# Patient Record
Sex: Female | Born: 1935 | Race: White | Hispanic: No | Smoking: Never smoker
Health system: Southern US, Community
[De-identification: ages and names within clinical notes are randomized; demographics above are authoritative.]

## PROBLEM LIST (undated history)

## (undated) DIAGNOSIS — N189 Chronic kidney disease, unspecified: Secondary | ICD-10-CM

## (undated) DIAGNOSIS — I10 Essential (primary) hypertension: Secondary | ICD-10-CM

## (undated) DIAGNOSIS — I4891 Unspecified atrial fibrillation: Secondary | ICD-10-CM

## (undated) DIAGNOSIS — K219 Gastro-esophageal reflux disease without esophagitis: Secondary | ICD-10-CM

## (undated) DIAGNOSIS — M1990 Unspecified osteoarthritis, unspecified site: Secondary | ICD-10-CM

## (undated) DIAGNOSIS — N3941 Urge incontinence: Secondary | ICD-10-CM

## (undated) DIAGNOSIS — M150 Primary generalized (osteo)arthritis: Secondary | ICD-10-CM

## (undated) DIAGNOSIS — C4491 Basal cell carcinoma of skin, unspecified: Secondary | ICD-10-CM

## (undated) DIAGNOSIS — K449 Diaphragmatic hernia without obstruction or gangrene: Secondary | ICD-10-CM

## (undated) DIAGNOSIS — Z950 Presence of cardiac pacemaker: Secondary | ICD-10-CM

## (undated) DIAGNOSIS — M8000XD Age-related osteoporosis with current pathological fracture, unspecified site, subsequent encounter for fracture with routine healing: Secondary | ICD-10-CM

## (undated) DIAGNOSIS — I509 Heart failure, unspecified: Secondary | ICD-10-CM

## (undated) DIAGNOSIS — E871 Hypo-osmolality and hyponatremia: Secondary | ICD-10-CM

## (undated) DIAGNOSIS — N390 Urinary tract infection, site not specified: Secondary | ICD-10-CM

## (undated) DIAGNOSIS — B019 Varicella without complication: Secondary | ICD-10-CM

## (undated) HISTORY — DX: Urge incontinence: N39.41

## (undated) HISTORY — PX: BUNIONECTOMY: SHX129

## (undated) HISTORY — DX: Gastro-esophageal reflux disease without esophagitis: K21.9

## (undated) HISTORY — DX: Age-related osteoporosis with current pathological fracture, unspecified site, subsequent encounter for fracture with routine healing: M80.00XD

## (undated) HISTORY — PX: SKIN SURGERY: SHX2413

## (undated) HISTORY — DX: Unspecified osteoarthritis, unspecified site: M19.90

## (undated) HISTORY — DX: Primary generalized (osteo)arthritis: M15.0

## (undated) HISTORY — PX: CARDIAC CATHETERIZATION: SHX172

## (undated) HISTORY — PX: OTHER SURGICAL HISTORY: SHX169

## (undated) HISTORY — DX: Urinary tract infection, site not specified: N39.0

## (undated) HISTORY — PX: TONSILLECTOMY: SUR1361

## (undated) HISTORY — PX: ABDOMINAL HYSTERECTOMY: SHX81

## (undated) HISTORY — DX: Basal cell carcinoma of skin, unspecified: C44.91

## (undated) HISTORY — DX: Varicella without complication: B01.9

## (undated) HISTORY — DX: Essential (primary) hypertension: I10

## (undated) HISTORY — DX: Chronic kidney disease, unspecified: N18.9

## (undated) HISTORY — PX: BASAL CELL CARCINOMA EXCISION: SHX1214

## (undated) HISTORY — PX: HEMORRHOID SURGERY: SHX153

## (undated) HISTORY — DX: Diaphragmatic hernia without obstruction or gangrene: K44.9

## (undated) HISTORY — DX: Hypo-osmolality and hyponatremia: E87.1

## (undated) HISTORY — PX: APPENDECTOMY: SHX54

## (undated) HISTORY — DX: Unspecified atrial fibrillation: I48.91

## (undated) HISTORY — DX: Presence of cardiac pacemaker: Z95.0

---

## 2003-11-25 HISTORY — PX: PACEMAKER PLACEMENT: SHX43

## 2008-12-25 HISTORY — PX: PACEMAKER INSERTION: SHX728

## 2013-11-24 HISTORY — PX: CORONARY ANGIOGRAM: SHX5786

## 2014-11-24 HISTORY — PX: SMALL INTESTINE SURGERY: SHX150

## 2015-10-26 ENCOUNTER — Encounter: Payer: Self-pay | Admitting: Internal Medicine

## 2015-12-05 ENCOUNTER — Encounter: Payer: Self-pay | Admitting: Family Medicine

## 2015-12-05 ENCOUNTER — Ambulatory Visit (INDEPENDENT_AMBULATORY_CARE_PROVIDER_SITE_OTHER): Payer: PPO | Admitting: Family Medicine

## 2015-12-05 ENCOUNTER — Telehealth: Payer: Self-pay | Admitting: Family Medicine

## 2015-12-05 VITALS — BP 136/88 | HR 82 | Temp 97.8°F | Ht 60.0 in | Wt 106.0 lb

## 2015-12-05 DIAGNOSIS — S90414A Abrasion, right lesser toe(s), initial encounter: Secondary | ICD-10-CM

## 2015-12-05 DIAGNOSIS — H04552 Acquired stenosis of left nasolacrimal duct: Secondary | ICD-10-CM

## 2015-12-05 DIAGNOSIS — I4891 Unspecified atrial fibrillation: Secondary | ICD-10-CM | POA: Diagnosis not present

## 2015-12-05 DIAGNOSIS — L97521 Non-pressure chronic ulcer of other part of left foot limited to breakdown of skin: Secondary | ICD-10-CM | POA: Diagnosis not present

## 2015-12-05 DIAGNOSIS — Q105 Congenital stenosis and stricture of lacrimal duct: Secondary | ICD-10-CM | POA: Insufficient documentation

## 2015-12-05 DIAGNOSIS — I10 Essential (primary) hypertension: Secondary | ICD-10-CM

## 2015-12-05 DIAGNOSIS — N3941 Urge incontinence: Secondary | ICD-10-CM

## 2015-12-05 DIAGNOSIS — G894 Chronic pain syndrome: Secondary | ICD-10-CM

## 2015-12-05 DIAGNOSIS — Z85828 Personal history of other malignant neoplasm of skin: Secondary | ICD-10-CM

## 2015-12-05 MED ORDER — POLYETHYLENE GLYCOL 3350 17 GM/SCOOP PO POWD: 17.0000 g | Freq: Two times a day (BID) | ORAL | Status: DC | PRN

## 2015-12-05 MED ORDER — OXYBUTYNIN CHLORIDE 5 MG PO TABS: 2.5000 mg | ORAL_TABLET | Freq: Two times a day (BID) | ORAL | Status: DC

## 2015-12-05 MED ORDER — HYDROCODONE-ACETAMINOPHEN 7.5-325 MG PO TABS: 1.0000 | ORAL_TABLET | Freq: Three times a day (TID) | ORAL | Status: DC | PRN

## 2015-12-05 MED ORDER — METOPROLOL SUCCINATE ER 50 MG PO TB24: 50.0000 mg | ORAL_TABLET | Freq: Every day | ORAL | Status: DC

## 2015-12-05 NOTE — Assessment & Plan Note (Signed)
Previously seen by ophthalmology for this. We will refer back to ophthalmology.

## 2015-12-05 NOTE — Patient Instructions (Addendum)
Nice to meet you. I refilled her medications. We have referred you to number of different specialists. If you do not hear anything regarding this in the next week please let us know. If you develop any extremity pain increased swelling, decreased sensation, weakness, chest pain, shortness of breath, palpitations, fevers, drainage, redness, or any new or change in symptoms please seek medical attention.

## 2015-12-05 NOTE — Assessment & Plan Note (Signed)
The patient's chronic pain is mostly from her back, though does have scattered other pains especially if she is more active than usual. Her prior PCP was filling her narcotic prescription. I advised her that I do not fill chronic narcotics. We will refer her to pain management and I will provide her with a short term narcotic prescription. I advised her that narcotics can make her drowsy.

## 2015-12-05 NOTE — Telephone Encounter (Signed)
Lisinopril was the medication that was suppose to be stopped due to the hospital stopping it.

## 2015-12-05 NOTE — Assessment & Plan Note (Signed)
Her toe appears to be well-healing with scab formation, though her toe and several surrounding toes do appear purplish in color which would be concerning for some level of decreased blood perfusion. She has good pulses in her feet. The area does not look infected. She is neurologically intact in her foot. She needs ABIs to evaluate this and evaluation by vascular to ensure that she does not have any vascular issues.

## 2015-12-05 NOTE — Assessment & Plan Note (Signed)
Asymptomatic. Appeared to be in sinus rhythm today on exam. Has a pacemaker in place. We will refer to cardiology.

## 2015-12-05 NOTE — Assessment & Plan Note (Signed)
At goal today. We will continue her current medications. We will request lab work from prior physicians.

## 2015-12-05 NOTE — Telephone Encounter (Signed)
Pt has question about her medication list. She states that Dr. Caryl Bis has two different BP medications listed and she wants to make sure with him if she is suppose to take both of them. Please advise. Pt cb [phone]

## 2015-12-05 NOTE — Assessment & Plan Note (Signed)
This is a long-term issue for the patient. She has previously been on oxybutynin with good effect. We will restart her on oxybutynin 2.5 mg daily.

## 2015-12-05 NOTE — Progress Notes (Signed)
Patient ID: Darlene Yoder, female   DOB: 1922-02-13, 82 y.o.   MRN: [id number]  Darlene Yoder, Darlene Yoder Phone: [phone]  Darlene Yoder is a 82 y.o. female who presents today for new patient visit.  A. fib: Patient notes long history of atrial fibrillation. Has a pacemaker in place. Has been followed by cardiology and EP for this. Last saw EP just prior to leaving her home in [place] and they noted that her pacemaker was operating well. No palpitations. No chest pain or shortness of breath. She takes Eliquis. No issues with bleeding. She is also on metoprolol. She needs referral to cardiology to be followed for this here.  Right second toe wound: Patient notes she was treated for cellulitis in [place] in November of last year. She got antibiotics for this. Notes that the wound was initially draining material although this stopped after receiving antibiotics. Notes the toe and the surrounding toes have been slightly purple since finishing the antibiotics. States she can feel well on the foot. She was told by her prior PCP that it was circulation issues, though she reports not having any testing done for this. Also note she has had an x-ray of her foot in [place].  History of basal cell carcinoma of the face: Patient notes having a history of several of these removed and frozen. previously had a spot on her right temple that was frozen prior to leaving [place], though this has come back.  Chronic pain: Patient notes history of chronic pain relating to back fusions. She now she does not have any pain right now. Notes her pain in her back occurs if she moves around a lot will worsen throughout the day. She has been on Vicodin prescribed by her prior PCP for this. No drowsiness with this. Some constipation for which she takes MiraLAX. No bowel or bladder incontinence, no saddle anesthesia, no fevers, no numbness or weakness.  Blocked left tear duct: Patient notes a history of this. She had a stent placed in it for  over 6 months. Since this is been removed she has continued to have issues with goopiness in her eyes and nose. No vision changes.  Hypertension: Patient notes she is just on metoprolol for this. No chest pain or shortness of breath. She does have minimal lower extremity edema for which she wears stockings. States she had lab work just prior to leaving [place].  Urge incontinence: Patient notes long history of this. She's been treated with oxybutynin and is responded well to this. She's taking the medicine she has no symptoms. She notes she has nocturia times 3 at night and an urge to go to the bathroom. No dysuria. No abdominal pain. No urinary frequency.  Active Ambulatory Problems    Diagnosis Date Noted  . Atrial fibrillation ([place]) 12/05/2015  . Abrasion of second toe, right 12/05/2015  . History of basal cell carcinoma of skin 12/05/2015  . Chronic pain syndrome 12/05/2015  . Blocked tear duct 12/05/2015  . Essential hypertension 12/05/2015  . Urge incontinence 12/05/2015   Resolved Ambulatory Problems    Diagnosis Date Noted  . No Resolved Ambulatory Problems   Past Medical History  Diagnosis Date  . Arthritis   . Carcinoma of the skin, basal cell   . Chickenpox   . GERD (gastroesophageal reflux disease)   . Hypertension   . CKD (chronic kidney disease)   . UTI (lower urinary tract infection)     Family History  Problem Relation Age of Onset  .  Heart disease Father   . Stroke Father   . Hypertension Father     Social History   Social History  . Marital Status: Widowed    Spouse Name: N/A  . Number of Children: N/A  . Years of Education: N/A   Occupational History  . Not on file.   Social History Main Topics  . Smoking status: Never Smoker   . Smokeless tobacco: Not on file  . Alcohol Use: No  . Drug Use: No  . Sexual Activity: Not on file   Other Topics Concern  . Not on file   Social History Narrative  . No narrative on file    ROS   General:   Negative for nexplained weight loss, fever Skin: Negative for new or changing mole, sore that won't heal HEENT: Positive for trouble hearing, Negative for trouble seeing, ringing in ears, mouth sores, hoarseness, change in voice, dysphagia. CV:  Positive for lower extremity edema, Negative for chest pain, dyspnea, palpitations Resp: Negative for cough, dyspnea, hemoptysis GI: Negative for nausea, vomiting, diarrhea, constipation, abdominal pain, melena, hematochezia. GU: Negative for dysuria, incontinence, urinary hesitance, hematuria, vaginal or penile discharge, polyuria, sexual difficulty, lumps in testicle or breasts MSK: Negative for muscle cramps or aches, joint pain or swelling Neuro: Negative for headaches, weakness, numbness, dizziness, passing out/fainting Psych: Negative for depression, anxiety, memory problems  Objective  Physical Exam Filed Vitals:   12/05/15 1327  BP: 136/88  Pulse: 82  Temp: 97.8 F (36.6 C)    BP Readings from Last 3 Encounters:  12/05/15 136/88   Wt Readings from Last 3 Encounters:  12/05/15 106 lb (48.081 kg)    Physical Exam  Constitutional: No distress.  HENT:  Head: Normocephalic and atraumatic.  Right Ear: External ear normal.  Left Ear: External ear normal.  Mouth/Throat: Oropharynx is clear and moist. No oropharyngeal exudate.  Right temple with skin colored 3-4 mm lesion that is nontender and nonerythematous  Eyes: Conjunctivae are normal. Pupils are equal, round, and reactive to light.  Left eye with small amount of clear watery discharge  Neck: Neck supple.  Cardiovascular: Normal rate, regular rhythm and normal heart sounds.  Exam reveals no gallop and no friction rub.   No murmur heard. Trace lower extremity edema  Pulmonary/Chest: Effort normal and breath sounds normal. No respiratory distress. She has no wheezes. She has no rales.  Abdominal: Soft. Bowel sounds are normal. She exhibits no distension. There is no tenderness.  There is no rebound.  Exam performed with patient sitting in chair as she refuses to get on the exam table  Musculoskeletal:  Right second toe with small scabbed over lesion on the dorsum of the toe, the toe appears mildly purplish in color as do the surrounding to toes and a small part of the forefoot, left foot with similar coloring to the second and third toes, 2+ DP pulses in bilateral feet, 2+ posterior tibialis pulses in bilateral feet, capillary refill is good and all toes, toes are cool to touch No midline spine tenderness or step-off, no muscular back tenderness  Lymphadenopathy:    She has no cervical adenopathy.  Neurological: She is alert.  5/5 strength in bilateral biceps, triceps, grip, quads, hamstrings, plantar and dorsiflexion, sensation to light touch intact in bilateral UE and LE, patient refused to get out of chair to assess gait  Skin: Skin is warm and dry. She is not diaphoretic.  Psychiatric: Mood and affect normal.     Assessment/Plan:  Atrial fibrillation (HCC) Asymptomatic. Appeared to be in sinus rhythm today on exam. Has a pacemaker in place. We will refer to cardiology.  Abrasion of second toe, right Her toe appears to be well-healing with scab formation, though her toe and several surrounding toes do appear purplish in color which would be concerning for some level of decreased blood perfusion. She has good pulses in her feet. The area does not look infected. She is neurologically intact in her foot. She needs ABIs to evaluate this and evaluation by vascular to ensure that she does not have any vascular issues.  History of basal cell carcinoma of skin Lesion on temple is suspicious for likely recurrence of basal cell carcinoma. We will refer to dermatology for further evaluation.  Chronic pain syndrome The patient's chronic pain is mostly from her back, though does have scattered other pains especially if she is more active than usual. Her prior PCP was  filling her narcotic prescription. I advised her that I do not fill chronic narcotics. We will refer her to pain management and I will provide her with a short term narcotic prescription. I advised her that narcotics can make her drowsy.  Blocked tear duct Previously seen by ophthalmology for this. We will refer back to ophthalmology.  Essential hypertension At goal today. We will continue her current medications. We will request lab work from prior physicians.  Urge incontinence This is a long-term issue for the patient. She has previously been on oxybutynin with good effect. We will restart her on oxybutynin 2.5 mg daily.    Orders Placed This Encounter  Procedures  . Ambulatory referral to Cardiology    Referral Priority:  Routine    Referral Type:  Consultation    Referral Reason:  Specialty Services Required    Requested Specialty:  Cardiology    Number of Visits Requested:  1  . Ambulatory referral to Pain Clinic    Referral Priority:  Routine    Referral Type:  Consultation    Referral Reason:  Specialty Services Required    Requested Specialty:  Pain Medicine    Number of Visits Requested:  1  . Ambulatory referral to Ophthalmology    Referral Priority:  Routine    Referral Type:  Consultation    Referral Reason:  Specialty Services Required    Requested Specialty:  Ophthalmology    Number of Visits Requested:  1  . Ambulatory referral to Vascular Surgery    Referral Priority:  Routine    Referral Type:  Surgical    Referral Reason:  Specialty Services Required    Requested Specialty:  Vascular Surgery    Number of Visits Requested:  1  . Ambulatory referral to Dermatology    Referral Priority:  Routine    Referral Type:  Consultation    Referral Reason:  Specialty Services Required    Requested Specialty:  Dermatology    Number of Visits Requested:  1    Meds ordered this encounter  Medications  . ELIQUIS 2.5 MG TABS tablet    Sig: Take 2.5 mg by mouth 2  (two) times daily.    Refill:  5  . pantoprazole (PROTONIX) 40 MG tablet    Sig: Take 40 mg by mouth 2 (two) times daily.    Refill:  7  . traZODone (DESYREL) 50 MG tablet    Sig: Take 50 mg by mouth at bedtime.    Refill:  1  . DISCONTD: HYDROcodone-acetaminophen (NORCO) 7.5-325 MG tablet  Sig: Take 1 tablet by mouth every 8 (eight) hours as needed for moderate pain.  Marland Kitchen DISCONTD: OXYBUTYNIN CHLORIDE PO    Sig: Take 5 mg by mouth 2 (two) times daily.  Marland Kitchen DISCONTD: lisinopril (PRINIVIL,ZESTRIL) 20 MG tablet    Sig: Take 20 mg by mouth daily.  Marland Kitchen DISCONTD: metoprolol succinate (TOPROL-XL) 50 MG 24 hr tablet    Sig: Take 50 mg by mouth daily. Take with or immediately following a meal.  . oxybutynin (DITROPAN) 5 MG tablet    Sig: Take 0.5 tablets (2.5 mg total) by mouth 2 (two) times daily.    Dispense:  30 tablet    Refill:  1  . metoprolol succinate (TOPROL-XL) 50 MG 24 hr tablet    Sig: Take 1 tablet (50 mg total) by mouth daily. Take with or immediately following a meal.    Dispense:  90 tablet    Refill:  1  . HYDROcodone-acetaminophen (NORCO) 7.5-325 MG tablet    Sig: Take 1 tablet by mouth every 8 (eight) hours as needed for moderate pain.    Dispense:  60 tablet    Refill:  0  . polyethylene glycol powder (GLYCOLAX/MIRALAX) powder    Sig: Take 17 g by mouth 2 (two) times daily as needed.    Dispense:  3350 g    Refill:  1    Dragon voice recognition software was used during the dictation process of this note. If any phrases or words seem inappropriate it is likely secondary to the translation process being inefficient.  Darlene Yoder

## 2015-12-05 NOTE — Assessment & Plan Note (Signed)
Lesion on temple is suspicious for likely recurrence of basal cell carcinoma. We will refer to dermatology for further evaluation.

## 2015-12-05 NOTE — Progress Notes (Signed)
Pre visit review using our clinic review tool, if applicable. No additional management support is needed unless otherwise documented below in the visit note. 

## 2015-12-06 NOTE — Telephone Encounter (Signed)
Lisinopril removed from Med list.

## 2015-12-07 ENCOUNTER — Ambulatory Visit (INDEPENDENT_AMBULATORY_CARE_PROVIDER_SITE_OTHER): Payer: PPO | Admitting: Cardiovascular Disease

## 2015-12-07 ENCOUNTER — Encounter: Payer: Self-pay | Admitting: Cardiovascular Disease

## 2015-12-07 VITALS — BP 124/68 | HR 80 | Ht 60.0 in | Wt 106.0 lb

## 2015-12-07 DIAGNOSIS — I10 Essential (primary) hypertension: Secondary | ICD-10-CM | POA: Diagnosis not present

## 2015-12-07 DIAGNOSIS — I4891 Unspecified atrial fibrillation: Secondary | ICD-10-CM | POA: Diagnosis not present

## 2015-12-07 DIAGNOSIS — Z950 Presence of cardiac pacemaker: Secondary | ICD-10-CM | POA: Insufficient documentation

## 2015-12-07 NOTE — Patient Instructions (Signed)
Medication Instructions:  Your physician recommends that you continue on your current medications as directed. Please refer to the Current Medication list given to you today.   Labwork: none  Testing/Procedures: none  Follow-Up: Refer to EP - Dr. Caryl Comes  Any Other Special Instructions Will Be Listed Below (If Applicable).     If you need a refill on your cardiac medications before your next appointment, please call your pharmacy.

## 2015-12-07 NOTE — Assessment & Plan Note (Signed)
I referred her to Dr. Caryl Comes to establish with our device clinic.  She can follow-up with general cardiology as needed.

## 2015-12-07 NOTE — Progress Notes (Signed)
Primary care physician: Dr. Caryl Bis   HPI  This is a pleasant 82 year old female who is here today to establish cardiovascular care. She moved last month from [place], [place] in order to live with her niece. The patient has known history of atrial fibrillation on anticoagulation and permanent pacemaker placement. She reports presenting in January 2010 with symptoms of heart failure and dizziness. A temporary pacemaker was placed followed by a [employer] permanent pacemaker placement during her hospitalization. She was initially anticoagulated with warfarin for atrial fibrillation but has a lot of issues regulating her INR. She was ultimately switched to low-dose Eliquis with no issues since then. She was hospitalized in [place] in October and November for falls and generalized weakness. She was found to have hyponatremia. These records are not available. She mentions that lisinopril was switched to Toprol during her most recent admission. She was told that she needed to live with somebody for support instead of living alone and thus she decided to move to [place] to live with her niece. She has been doing well overall with no significant chest pain, shortness of breath or palpitations. She reports chronic leg edema which improved with leg elevation.  Allergies  Allergen Reactions  . Tetanus Toxoids      Current Outpatient Prescriptions on File Prior to Visit  Medication Sig Dispense Refill  . ELIQUIS 2.5 MG TABS tablet Take 2.5 mg by mouth 2 (two) times daily.  5  . HYDROcodone-acetaminophen (NORCO) 7.5-325 MG tablet Take 1 tablet by mouth every 8 (eight) hours as needed for moderate pain. 60 tablet 0  . metoprolol succinate (TOPROL-XL) 50 MG 24 hr tablet Take 1 tablet (50 mg total) by mouth daily. Take with or immediately following a meal. 90 tablet 1  . oxybutynin (DITROPAN) 5 MG tablet Take 0.5 tablets (2.5 mg total) by mouth 2 (two) times daily. 30 tablet 1  . pantoprazole  (PROTONIX) 40 MG tablet Take 40 mg by mouth 2 (two) times daily.  7  . polyethylene glycol powder (GLYCOLAX/MIRALAX) powder Take 17 g by mouth 2 (two) times daily as needed. 3350 g 1  . traZODone (DESYREL) 50 MG tablet Take 50 mg by mouth at bedtime.  1   No current facility-administered medications on file prior to visit.     Past Medical History  Diagnosis Date  . Arthritis   . Carcinoma of the skin, basal cell   . Chickenpox   . GERD (gastroesophageal reflux disease)   . Atrial fibrillation ([place])   . Hypertension   . CKD (chronic kidney disease)   . Urge incontinence   . UTI (lower urinary tract infection)      Past Surgical History  Procedure Laterality Date  . Appendectomy    . Abdominal hysterectomy    . Pacemaker insertion  12/25/2008    [employer] pacemaker DRRF 2210 Serial [id number]  . Hemorrhoid surgery    . Tonsillectomy    . Skin surgery      removal of a mass on cheek  . Basal cell carcinoma excision Left     cheek  . Coronary angiogram  2015    [place]      Family History  Problem Relation Age of Onset  . Heart disease Father   . Stroke Father   . Hypertension Father      Social History   Social History  . Marital Status: Widowed    Spouse Name: N/A  . Number of Children: N/A  . Years  of Education: N/A   Occupational History  . Not on file.   Social History Main Topics  . Smoking status: Never Smoker   . Smokeless tobacco: Not on file  . Alcohol Use: No  . Drug Use: No  . Sexual Activity: Not on file   Other Topics Concern  . Not on file   Social History Narrative     ROS A 10 point review of system was performed. It is negative other than that mentioned in the history of present illness.   PHYSICAL EXAM   BP 124/68 mmHg  Pulse 80  Ht 5' (1.524 m)  Wt 106 lb (48.081 kg)  BMI 20.70 kg/m2 Constitutional: She is oriented to person, place, and time. She appears well-developed and well-nourished. No distress.  HENT: No nasal  discharge.  Head: Normocephalic and atraumatic.  Eyes: Pupils are equal and round. No discharge.  Neck: Normal range of motion. Neck supple. No JVD present. No thyromegaly present.  Cardiovascular: Normal rate, regular rhythm, normal heart sounds. Exam reveals no gallop and no friction rub. No murmur heard.  Pulmonary/Chest: Effort normal and breath sounds normal. No stridor. No respiratory distress. She has no wheezes. She has no rales. She exhibits no tenderness.  Abdominal: Soft. Bowel sounds are normal. She exhibits no distension. There is no tenderness. There is no rebound and no guarding.  Musculoskeletal: Normal range of motion. She exhibits +2 edema and no tenderness.  Neurological: She is alert and oriented to person, place, and time. Coordination normal.  Skin: Skin is warm and dry. No rash noted. She is not diaphoretic. No erythema. No pallor.  Psychiatric: She has a normal mood and affect. Her behavior is normal. Judgment and thought content normal.     EKG: Ventricular paced rhythm with underlying atrial fibrillation.   ASSESSMENT AND PLAN

## 2015-12-07 NOTE — Assessment & Plan Note (Signed)
He is doing well overall from a cardiac standpoint. She is tolerating anticoagulation with no side effects. She is on appropriate dose of Eliquis considering her age and weight.

## 2015-12-10 DIAGNOSIS — H10402 Unspecified chronic conjunctivitis, left eye: Secondary | ICD-10-CM | POA: Diagnosis not present

## 2015-12-13 DIAGNOSIS — I739 Peripheral vascular disease, unspecified: Secondary | ICD-10-CM | POA: Diagnosis not present

## 2015-12-13 DIAGNOSIS — E785 Hyperlipidemia, unspecified: Secondary | ICD-10-CM | POA: Diagnosis not present

## 2015-12-13 DIAGNOSIS — I10 Essential (primary) hypertension: Secondary | ICD-10-CM | POA: Diagnosis not present

## 2015-12-13 DIAGNOSIS — I701 Atherosclerosis of renal artery: Secondary | ICD-10-CM | POA: Diagnosis not present

## 2015-12-13 DIAGNOSIS — I6523 Occlusion and stenosis of bilateral carotid arteries: Secondary | ICD-10-CM | POA: Diagnosis not present

## 2015-12-13 DIAGNOSIS — M79609 Pain in unspecified limb: Secondary | ICD-10-CM | POA: Diagnosis not present

## 2015-12-13 DIAGNOSIS — I70213 Atherosclerosis of native arteries of extremities with intermittent claudication, bilateral legs: Secondary | ICD-10-CM | POA: Diagnosis not present

## 2015-12-17 ENCOUNTER — Telehealth: Payer: Self-pay

## 2015-12-17 MED ORDER — OXYBUTYNIN CHLORIDE 5 MG PO TABS: 5.0000 mg | ORAL_TABLET | Freq: Two times a day (BID) | ORAL | Status: DC

## 2015-12-17 NOTE — Telephone Encounter (Signed)
Previous Rx for Oxybutynin Chloride Po 5mg , take 5mg  by mouth 2(two) times daily. Pt states that current dosage is not working and she is aware of the side effects. Please advise

## 2015-12-17 NOTE — Telephone Encounter (Signed)
Notified pt's niece. She is aware of possible side effects but wants to continue with new course previously prescribed by Dr. Caryl Bis, she states that if anything changes she will contact the office.

## 2015-12-17 NOTE — Telephone Encounter (Signed)
What was the previous dosage? Depending on what it was we may be able to increase the dosage, though at her age she is at increased risk of adverse effects from this medication including agitation, confusion, drowsiness, dizziness, hallucinations, headache, and/or blurred vision, dry mouth, constipation, or arrhythmia.

## 2015-12-17 NOTE — Telephone Encounter (Signed)
She can increase to 5 mg twice a day. She should monitor for signs of the side effects and if they occur she needs to let us know and stop the medication.

## 2015-12-17 NOTE — Telephone Encounter (Signed)
Pt's niece states that pt is going to the bathroom more often at night, Niece states that current dosage for Ditropan 5mg  2.5mg  in am and 2.5 mg at night is not working, she states that when cutting it in half it crumbles. Wants to know if she can go back to previous dosage. Please advise

## 2015-12-24 ENCOUNTER — Telehealth: Payer: Self-pay | Admitting: Cardiovascular Disease

## 2015-12-24 NOTE — Telephone Encounter (Signed)
Presence of permanent cardiac pacemaker - Darlene Hampshire, MD at 12/07/2015 3:53 PM     Status: Written Related Problem: Presence of permanent cardiac pacemaker   Expand All Collapse All   I referred her to Dr. Caryl Comes to establish with our device clinic.  She can follow-up with general cardiology as needed.       S/w Caffie Damme, niece, regarding Dr. Tyrell Antonio recommendations at 1/13 OV. [name] verbalized understanding and confirmed Feb appt w/Dr. Caryl Comes.

## 2015-12-24 NOTE — Telephone Encounter (Signed)
Patient family wants to know if she still needs to fu with Arida since he referred her to see Dr. Caryl Comes.  Please confirm and call or let me know I will call her back .

## 2015-12-25 ENCOUNTER — Ambulatory Visit: Payer: PPO | Attending: Pain Medicine | Admitting: Pain Medicine

## 2015-12-25 ENCOUNTER — Encounter: Payer: Self-pay | Admitting: Pain Medicine

## 2015-12-25 VITALS — BP 164/83 | HR 79 | Temp 98.1°F | Resp 16 | Ht 61.0 in | Wt 106.0 lb

## 2015-12-25 DIAGNOSIS — M546 Pain in thoracic spine: Secondary | ICD-10-CM | POA: Diagnosis not present

## 2015-12-25 DIAGNOSIS — M5030 Other cervical disc degeneration, unspecified cervical region: Secondary | ICD-10-CM | POA: Insufficient documentation

## 2015-12-25 DIAGNOSIS — I10 Essential (primary) hypertension: Secondary | ICD-10-CM | POA: Insufficient documentation

## 2015-12-25 DIAGNOSIS — M47894 Other spondylosis, thoracic region: Secondary | ICD-10-CM

## 2015-12-25 DIAGNOSIS — M5416 Radiculopathy, lumbar region: Secondary | ICD-10-CM | POA: Diagnosis not present

## 2015-12-25 DIAGNOSIS — M170 Bilateral primary osteoarthritis of knee: Secondary | ICD-10-CM

## 2015-12-25 DIAGNOSIS — M1990 Unspecified osteoarthritis, unspecified site: Secondary | ICD-10-CM | POA: Diagnosis not present

## 2015-12-25 DIAGNOSIS — M5134 Other intervertebral disc degeneration, thoracic region: Secondary | ICD-10-CM

## 2015-12-25 DIAGNOSIS — Z950 Presence of cardiac pacemaker: Secondary | ICD-10-CM | POA: Insufficient documentation

## 2015-12-25 DIAGNOSIS — M19012 Primary osteoarthritis, left shoulder: Secondary | ICD-10-CM | POA: Insufficient documentation

## 2015-12-25 DIAGNOSIS — M533 Sacrococcygeal disorders, not elsewhere classified: Secondary | ICD-10-CM | POA: Diagnosis not present

## 2015-12-25 DIAGNOSIS — M47816 Spondylosis without myelopathy or radiculopathy, lumbar region: Secondary | ICD-10-CM

## 2015-12-25 DIAGNOSIS — M19011 Primary osteoarthritis, right shoulder: Secondary | ICD-10-CM | POA: Insufficient documentation

## 2015-12-25 DIAGNOSIS — M47814 Spondylosis without myelopathy or radiculopathy, thoracic region: Secondary | ICD-10-CM

## 2015-12-25 DIAGNOSIS — R32 Unspecified urinary incontinence: Secondary | ICD-10-CM | POA: Diagnosis not present

## 2015-12-25 DIAGNOSIS — M5136 Other intervertebral disc degeneration, lumbar region: Secondary | ICD-10-CM | POA: Insufficient documentation

## 2015-12-25 DIAGNOSIS — Z79891 Long term (current) use of opiate analgesic: Secondary | ICD-10-CM | POA: Diagnosis not present

## 2015-12-25 DIAGNOSIS — M4184 Other forms of scoliosis, thoracic region: Secondary | ICD-10-CM | POA: Diagnosis not present

## 2015-12-25 DIAGNOSIS — M47812 Spondylosis without myelopathy or radiculopathy, cervical region: Secondary | ICD-10-CM

## 2015-12-25 DIAGNOSIS — F1190 Opioid use, unspecified, uncomplicated: Secondary | ICD-10-CM | POA: Diagnosis not present

## 2015-12-25 DIAGNOSIS — Z79899 Other long term (current) drug therapy: Secondary | ICD-10-CM | POA: Diagnosis not present

## 2015-12-25 DIAGNOSIS — Z5181 Encounter for therapeutic drug level monitoring: Secondary | ICD-10-CM | POA: Diagnosis not present

## 2015-12-25 DIAGNOSIS — M51369 Other intervertebral disc degeneration, lumbar region without mention of lumbar back pain or lower extremity pain: Secondary | ICD-10-CM

## 2015-12-25 DIAGNOSIS — M4120 Other idiopathic scoliosis, site unspecified: Secondary | ICD-10-CM

## 2015-12-25 DIAGNOSIS — M5412 Radiculopathy, cervical region: Secondary | ICD-10-CM | POA: Diagnosis not present

## 2015-12-25 DIAGNOSIS — M47817 Spondylosis without myelopathy or radiculopathy, lumbosacral region: Secondary | ICD-10-CM | POA: Diagnosis not present
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
## 2015-12-25 NOTE — Patient Instructions (Addendum)
Continue present medication hydrocodone acetaminophen provided without drowsiness confusion excessive sedation and other side effects  F/U PCP Dr.Sonnenberg for evaliation of  BP and general medical  condition  F/U surgical evaluation. May consider pending follow-up evaluations  F/U neurological evaluation. May consider pending follow-up evaluations  May consider radiofrequency rhizolysis or intraspinal procedures pending response to present treatment and F/U evaluation   Patient to call Pain Management Center should patient have concerns prior to scheduled return appointment.

## 2015-12-25 NOTE — Progress Notes (Signed)
Safety precautions to be maintained throughout the outpatient stay will include: orient to surroundings, keep bed in low position, maintain call bell within reach at all times, provide assistance with transfer out of bed and ambulation.  

## 2016-01-01 DIAGNOSIS — I10 Essential (primary) hypertension: Secondary | ICD-10-CM | POA: Diagnosis not present

## 2016-01-01 DIAGNOSIS — Z85828 Personal history of other malignant neoplasm of skin: Secondary | ICD-10-CM | POA: Diagnosis not present

## 2016-01-01 DIAGNOSIS — M7989 Other specified soft tissue disorders: Secondary | ICD-10-CM | POA: Diagnosis not present

## 2016-01-01 DIAGNOSIS — D485 Neoplasm of uncertain behavior of skin: Secondary | ICD-10-CM | POA: Diagnosis not present

## 2016-01-01 DIAGNOSIS — E785 Hyperlipidemia, unspecified: Secondary | ICD-10-CM | POA: Diagnosis not present

## 2016-01-01 DIAGNOSIS — I70213 Atherosclerosis of native arteries of extremities with intermittent claudication, bilateral legs: Secondary | ICD-10-CM | POA: Diagnosis not present

## 2016-01-01 DIAGNOSIS — I789 Disease of capillaries, unspecified: Secondary | ICD-10-CM | POA: Diagnosis not present

## 2016-01-01 DIAGNOSIS — M79609 Pain in unspecified limb: Secondary | ICD-10-CM | POA: Diagnosis not present

## 2016-01-01 DIAGNOSIS — L821 Other seborrheic keratosis: Secondary | ICD-10-CM | POA: Diagnosis not present

## 2016-01-01 DIAGNOSIS — I701 Atherosclerosis of renal artery: Secondary | ICD-10-CM | POA: Diagnosis not present

## 2016-01-01 DIAGNOSIS — L570 Actinic keratosis: Secondary | ICD-10-CM | POA: Diagnosis not present

## 2016-01-01 DIAGNOSIS — I6523 Occlusion and stenosis of bilateral carotid arteries: Secondary | ICD-10-CM | POA: Diagnosis not present

## 2016-01-01 DIAGNOSIS — L812 Freckles: Secondary | ICD-10-CM | POA: Diagnosis not present

## 2016-01-01 DIAGNOSIS — L578 Other skin changes due to chronic exposure to nonionizing radiation: Secondary | ICD-10-CM | POA: Diagnosis not present

## 2016-01-01 DIAGNOSIS — L820 Inflamed seborrheic keratosis: Secondary | ICD-10-CM | POA: Diagnosis not present

## 2016-01-01 DIAGNOSIS — I739 Peripheral vascular disease, unspecified: Secondary | ICD-10-CM | POA: Diagnosis not present

## 2016-01-08 ENCOUNTER — Encounter: Payer: Self-pay | Admitting: Family Medicine

## 2016-01-08 ENCOUNTER — Ambulatory Visit (INDEPENDENT_AMBULATORY_CARE_PROVIDER_SITE_OTHER): Payer: PPO | Admitting: Family Medicine

## 2016-01-08 VITALS — BP 138/72 | HR 70 | Temp 97.7°F | Ht 60.0 in | Wt 107.6 lb

## 2016-01-08 DIAGNOSIS — I701 Atherosclerosis of renal artery: Secondary | ICD-10-CM | POA: Diagnosis not present

## 2016-01-08 DIAGNOSIS — E871 Hypo-osmolality and hyponatremia: Secondary | ICD-10-CM | POA: Diagnosis not present

## 2016-01-08 DIAGNOSIS — I739 Peripheral vascular disease, unspecified: Secondary | ICD-10-CM | POA: Diagnosis not present

## 2016-01-08 DIAGNOSIS — M7989 Other specified soft tissue disorders: Secondary | ICD-10-CM | POA: Diagnosis not present

## 2016-01-08 DIAGNOSIS — I10 Essential (primary) hypertension: Secondary | ICD-10-CM | POA: Diagnosis not present

## 2016-01-08 DIAGNOSIS — Z85828 Personal history of other malignant neoplasm of skin: Secondary | ICD-10-CM

## 2016-01-08 DIAGNOSIS — N3941 Urge incontinence: Secondary | ICD-10-CM | POA: Diagnosis not present

## 2016-01-08 DIAGNOSIS — E785 Hyperlipidemia, unspecified: Secondary | ICD-10-CM | POA: Diagnosis not present

## 2016-01-08 DIAGNOSIS — G894 Chronic pain syndrome: Secondary | ICD-10-CM

## 2016-01-08 DIAGNOSIS — I70213 Atherosclerosis of native arteries of extremities with intermittent claudication, bilateral legs: Secondary | ICD-10-CM | POA: Diagnosis not present

## 2016-01-08 DIAGNOSIS — L97519 Non-pressure chronic ulcer of other part of right foot with unspecified severity: Secondary | ICD-10-CM | POA: Diagnosis not present

## 2016-01-08 DIAGNOSIS — I6523 Occlusion and stenosis of bilateral carotid arteries: Secondary | ICD-10-CM | POA: Diagnosis not present

## 2016-01-08 DIAGNOSIS — I890 Lymphedema, not elsewhere classified: Secondary | ICD-10-CM | POA: Diagnosis not present

## 2016-01-08 DIAGNOSIS — M79609 Pain in unspecified limb: Secondary | ICD-10-CM | POA: Diagnosis not present

## 2016-01-08 DIAGNOSIS — G479 Sleep disorder, unspecified: Secondary | ICD-10-CM | POA: Insufficient documentation

## 2016-01-08 LAB — BASIC METABOLIC PANEL
BUN: 16 mg/dL (ref 6–23)
CO2: 28 meq/L (ref 19–32)
Calcium: 9.5 mg/dL (ref 8.4–10.5)
Chloride: 98 mEq/L (ref 96–112)
Creatinine, Ser: 0.91 mg/dL (ref 0.40–1.20)
GFR: 61.52 mL/min (ref >60.00)
GLUCOSE: 90 mg/dL (ref 70–99)
Potassium: 4.7 mEq/L (ref 3.5–5.1)
Sodium: 132 mEq/L — ABNORMAL LOW (ref 135–145)

## 2016-01-08 MED ORDER — TRAZODONE HCL 50 MG PO TABS: 50.0000 mg | ORAL_TABLET | Freq: Every day | ORAL | Status: DC

## 2016-01-08 MED ORDER — OXYBUTYNIN CHLORIDE 5 MG PO TABS: 5.0000 mg | ORAL_TABLET | Freq: Two times a day (BID) | ORAL | Status: DC

## 2016-01-08 NOTE — Assessment & Plan Note (Signed)
Well-controlled on trazodone. We will refill this medication.

## 2016-01-08 NOTE — Progress Notes (Signed)
Pre visit review using our clinic review tool, if applicable. No additional management support is needed unless otherwise documented below in the visit note. 

## 2016-01-08 NOTE — Patient Instructions (Signed)
Nice to see you. We'll check lab work today. I sent and a refill of your trazodone. Please call your pharmacy for refill on her oxybutynin. We will call with your lab results.

## 2016-01-08 NOTE — Assessment & Plan Note (Signed)
Improved. Refill given on oxybutynin.

## 2016-01-08 NOTE — Assessment & Plan Note (Signed)
We'll check BMP today.

## 2016-01-08 NOTE — Assessment & Plan Note (Signed)
Stable. Advised that they need to call the pain management physician for refill on her pain medication. Benign back exam. Neurologically intact. No red flags. Given return precautions.

## 2016-01-08 NOTE — Assessment & Plan Note (Signed)
She will continue to follow with dermatology. She will monitor the lesions on her face.

## 2016-01-08 NOTE — Progress Notes (Signed)
Patient ID: Darlene Yoder, female   DOB: Feb 01, 1922, 82 y.o.   MRN: [id number]  Tommi Rumps, MD Phone: [phone]  Darlene Yoder is a 82 y.o. female who presents today for follow-up.  Basal cell carcinoma: Patient reports she saw dermatology and they froze several places on her face. She notes they biopsied the area on her right temple that showed a precancerous lesion. She has not noticed any change in the areas. Follow-up in May with dermatology.  Chronic pain: She started seeing pain management. They need to call the pain management physician to get a refill on her medications. She notes chronic pain in a number of places. This is unchanged. She notes her lumbar spine is fused. No numbness, weakness, bowel or bladder incontinence, saddle anesthesia, or fevers. No recent falls.  History of hyponatremia: Patient notes last fall she was found to be hyponatremic while in [place]. She had a fall and was subsequently hospitalized. Last check was right before the left [place]. They do not know the number. Patient has been eating much better since moving to [place]. Was previously not eating well and was drinking a significant amount of water.  Urgent incontinence: Patient notes this is much improved with taking oxybutynin twice a day. Only gets up one time per night. Was previously getting up 3-4 times per night. No abdominal pain. No imbalance or drowsiness or falls with this.  Sleep issues: Patient notes she's been on trazodone for many years. While taking it she gets 7-1/2 hours of sleep. She does sleep well with taking this. She does not know how she would react without taking it. Now drowsiness at night.  PMH: nonsmoker.   ROS see history of present illness  Objective  Physical Exam Filed Vitals:   01/08/16 1054  BP: 138/72  Pulse: 70  Temp: 97.7 F (36.5 C)    BP Readings from Last 3 Encounters:  01/08/16 138/72  12/25/15 164/83  12/07/15 124/68   Wt Readings from Last  3 Encounters:  01/08/16 107 lb 9.6 oz (48.807 kg)  12/25/15 106 lb (48.081 kg)  12/07/15 106 lb (48.081 kg)    Physical Exam  Constitutional: No distress.  HENT:  Head: Normocephalic and atraumatic.  Mouth/Throat: Oropharynx is clear and moist. No oropharyngeal exudate.  Eyes: Conjunctivae are normal. Pupils are equal, round, and reactive to light.  Cardiovascular: Normal heart sounds.   Irregularly irregular, rate controlled  Pulmonary/Chest: Effort normal and breath sounds normal.  Abdominal: Soft. She exhibits no distension. There is no tenderness. There is no rebound and no guarding.  Musculoskeletal:  No midline spine tenderness, no muscular back tenderness, no neck swelling  Neurological: She is alert.  5/5 strength in bilateral biceps, triceps, grip, quads, hamstrings, plantar and dorsiflexion, sensation to light touch intact in bilateral UE and LE  Skin: She is not diaphoretic.  2 areas on her nose that appeared to have been frozen     Assessment/Plan: Please see individual problem list.  History of basal cell carcinoma of skin She will continue to follow with dermatology. She will monitor the lesions on her face.  Chronic pain syndrome Stable. Advised that they need to call the pain management physician for refill on her pain medication. Benign back exam. Neurologically intact. No red flags. Given return precautions.  History of hyponatremia We'll check BMP today.  Urge incontinence Improved. Refill given on oxybutynin.  Sleeping difficulty Well-controlled on trazodone. We will refill this medication.    Orders Placed This Encounter  Procedures  . Basic Metabolic Panel (BMET)    Meds ordered this encounter  Medications  . traZODone (DESYREL) 50 MG tablet    Sig: Take 1 tablet (50 mg total) by mouth at bedtime.    Dispense:  90 tablet    Refill:  1  . oxybutynin (DITROPAN) 5 MG tablet    Sig: Take 1 tablet (5 mg total) by mouth 2 (two) times daily.     Dispense:  60 tablet    Refill:  [address]

## 2016-01-09 ENCOUNTER — Telehealth: Payer: Self-pay | Admitting: Pain Medicine

## 2016-01-09 NOTE — Telephone Encounter (Signed)
We do have permission to prescribe meds. Dr. Primus Bravo, please advise.

## 2016-01-09 NOTE — Telephone Encounter (Signed)
Nurses and Secretaries We will discuss patient's medications at time of next evaluation Nurses inform patient that she should ask her physician to prescribe enough medication for her until she has evaluation with [place]. Nurses you may need to make this call for the patient to request that her physician prescribe medications for her until her next evaluation with me Please discussed with me if necessary to clarify any issues

## 2016-01-09 NOTE — Telephone Encounter (Signed)
Dr. Caryl Bis has never given patient any narcotics. The last hydrocodone given to her was her physician in [place]. Dr. Caryl Bis is ONLY her PCP and sent her here for the specific reason of you prescribing her pain meds. Please advise as Dr. Caryl Bis is not going to write for her.She will be out of meds this week and the daughter wants to know what she is going to do about pain meds. Thank you.

## 2016-01-09 NOTE — Telephone Encounter (Signed)
Patient's niece says Dr. Caryl Bis gave permission  Per returned fax for Dr. Primus Bravo to write scripts for Darlene Yoder, she will be out in 3 days and her appt is not until 01-24-16, please call to discuss options

## 2016-01-10 ENCOUNTER — Encounter: Payer: Self-pay | Admitting: Internal Medicine

## 2016-01-10 ENCOUNTER — Encounter (INDEPENDENT_AMBULATORY_CARE_PROVIDER_SITE_OTHER): Payer: Self-pay

## 2016-01-10 ENCOUNTER — Ambulatory Visit: Payer: PPO | Attending: Pain Medicine | Admitting: Pain Medicine

## 2016-01-10 ENCOUNTER — Ambulatory Visit (INDEPENDENT_AMBULATORY_CARE_PROVIDER_SITE_OTHER): Payer: PPO | Admitting: Internal Medicine

## 2016-01-10 ENCOUNTER — Encounter: Payer: Self-pay | Admitting: Pain Medicine

## 2016-01-10 VITALS — BP 138/60 | HR 70 | Ht 60.0 in | Wt 107.0 lb

## 2016-01-10 VITALS — BP 164/81 | HR 70 | Temp 97.9°F | Resp 16 | Ht 60.0 in | Wt 107.0 lb

## 2016-01-10 DIAGNOSIS — M542 Cervicalgia: Secondary | ICD-10-CM | POA: Diagnosis not present

## 2016-01-10 DIAGNOSIS — M5134 Other intervertebral disc degeneration, thoracic region: Secondary | ICD-10-CM | POA: Diagnosis not present

## 2016-01-10 DIAGNOSIS — M5030 Other cervical disc degeneration, unspecified cervical region: Secondary | ICD-10-CM | POA: Diagnosis not present

## 2016-01-10 DIAGNOSIS — M4186 Other forms of scoliosis, lumbar region: Secondary | ICD-10-CM | POA: Diagnosis not present

## 2016-01-10 DIAGNOSIS — M4184 Other forms of scoliosis, thoracic region: Secondary | ICD-10-CM | POA: Insufficient documentation

## 2016-01-10 DIAGNOSIS — M19012 Primary osteoarthritis, left shoulder: Secondary | ICD-10-CM | POA: Diagnosis not present

## 2016-01-10 DIAGNOSIS — M170 Bilateral primary osteoarthritis of knee: Secondary | ICD-10-CM | POA: Diagnosis not present

## 2016-01-10 DIAGNOSIS — M5136 Other intervertebral disc degeneration, lumbar region: Secondary | ICD-10-CM | POA: Diagnosis not present

## 2016-01-10 DIAGNOSIS — M47812 Spondylosis without myelopathy or radiculopathy, cervical region: Secondary | ICD-10-CM

## 2016-01-10 DIAGNOSIS — M47814 Spondylosis without myelopathy or radiculopathy, thoracic region: Secondary | ICD-10-CM

## 2016-01-10 DIAGNOSIS — M533 Sacrococcygeal disorders, not elsewhere classified: Secondary | ICD-10-CM | POA: Diagnosis not present

## 2016-01-10 DIAGNOSIS — I4891 Unspecified atrial fibrillation: Secondary | ICD-10-CM

## 2016-01-10 DIAGNOSIS — M5412 Radiculopathy, cervical region: Secondary | ICD-10-CM | POA: Diagnosis not present

## 2016-01-10 DIAGNOSIS — M19011 Primary osteoarthritis, right shoulder: Secondary | ICD-10-CM

## 2016-01-10 DIAGNOSIS — M4120 Other idiopathic scoliosis, site unspecified: Secondary | ICD-10-CM

## 2016-01-10 DIAGNOSIS — M545 Low back pain: Secondary | ICD-10-CM | POA: Diagnosis not present

## 2016-01-10 DIAGNOSIS — M47816 Spondylosis without myelopathy or radiculopathy, lumbar region: Secondary | ICD-10-CM

## 2016-01-10 DIAGNOSIS — M47894 Other spondylosis, thoracic region: Secondary | ICD-10-CM

## 2016-01-10 DIAGNOSIS — M51369 Other intervertebral disc degeneration, lumbar region without mention of lumbar back pain or lower extremity pain: Secondary | ICD-10-CM

## 2016-01-10 DIAGNOSIS — M5416 Radiculopathy, lumbar region: Secondary | ICD-10-CM | POA: Diagnosis not present

## 2016-01-10 DIAGNOSIS — M47817 Spondylosis without myelopathy or radiculopathy, lumbosacral region: Secondary | ICD-10-CM | POA: Diagnosis not present

## 2016-01-10 MED ORDER — HYDROCODONE-ACETAMINOPHEN 7.5-325 MG PO TABS: ORAL_TABLET | ORAL | Status: DC

## 2016-01-10 NOTE — Patient Instructions (Signed)
Medication Instructions: - Your physician recommends that you continue on your current medications as directed. Please refer to the Current Medication list given to you today.  Labwork: - none  Procedures/Testing: - none  Follow-Up: - Your physician recommends that you schedule a follow-up appointment in: 4 months with Raquel Sarna in the device clinic  - Your physician wants you to follow-up in: 1 year with Dr. Caryl Comes. You will receive a reminder letter in the mail two months in advance. If you don't receive a letter, please call our office to schedule the follow-up appointment.  Any Additional Special Instructions Will Be Listed Below (If Applicable).     If you need a refill on your cardiac medications before your next appointment, please call your pharmacy.

## 2016-01-10 NOTE — Patient Instructions (Signed)
PLAN    Continue present medication hydrocodone acetaminophen provided without drowsiness confusion excessive sedation and other side effects  F/U PCP Dr.Sonnenberg for evaliation of  BP and general medical  condition  F/U surgical evaluation. May consider pending follow-up evaluations  F/U neurological evaluation. May consider pending follow-up evaluations  May consider radiofrequency rhizolysis or intraspinal procedures pending response to present treatment and F/U evaluation . We will avoid such procedures at this time  Patient to call Pain Management Center should patient have concerns prior to scheduled return appointment.

## 2016-01-10 NOTE — Progress Notes (Signed)
ELECTROPHYSIOLOGY CONSULT NOTE  Patient ID: Darlene Yoder, MRN: [id number], DOB/AGE: [birth year] 82 y.o. Admit date: (Not on file) Date of Consult: 01/10/2016  Primary Physician: Tommi Rumps, MD Primary Cardiologist: MA Consulting Physician MA  Chief Complaint: to establish Pacemaker followup   HPI Darlene Yoder is a 82 y.o. female referred to establish pacemaker follow-up.  She currently presented 2010 with heart failure and dizziness. She underwent pacemaker implantation.  She was also noted to be atrial fibrillation and underwent anticoagulation initially with warfarin; difficulty to maintain T TR resulted in her being switched to low-dose apixaban.  She has had a history of hyponatremia; most recent sodium was 132. The EMR was reviewed but no significant medical records are identified although the sodium in the 129 range  She denies significant changes in exercise tolerance over the last 6 months that are adverse. In fact she is much better and much stronger from when she came from up not on a few months ago. This is true not withstanding the interval development of persistent atrial fibrillation.    She has some peripheral edema. This responds to decrease oral intake and racing her feet.  She has no prior history of stroke.  She recalls prior angiography 3; she was told that coronary arteries were normal. She does not have a recollection of echocardiogram or assessment of LV function     Past Medical History  Diagnosis Date  . Arthritis   . Carcinoma of the skin, basal cell   . Chickenpox   . GERD (gastroesophageal reflux disease)   . Atrial fibrillation ([place])   . Hypertension   . CKD (chronic kidney disease)   . Urge incontinence   . UTI (lower urinary tract infection)       Surgical History:  Past Surgical History  Procedure Laterality Date  . Appendectomy    . Abdominal hysterectomy    . Pacemaker insertion  12/25/2008    [employer] pacemaker DRRF 2210  Serial [id number]  . Hemorrhoid surgery    . Tonsillectomy    . Skin surgery      removal of a mass on cheek  . Basal cell carcinoma excision Left     cheek  . Coronary angiogram  2015    [place]   . Small intestine surgery  2016  . Broken pelvis    . Coronary angiogram  2015     Home Meds: Prior to Admission medications   Medication Sig Start Date End Date Taking? Authorizing Provider  ELIQUIS 2.5 MG TABS tablet Take 2.5 mg by mouth 2 (two) times daily. 11/21/15  Yes Historical Provider, MD  HYDROcodone-acetaminophen (NORCO) 7.5-325 MG tablet Take 1 tablet by mouth every 8 (eight) hours as needed for moderate pain. 12/05/15  Yes Leone Haven, MD  metoprolol succinate (TOPROL-XL) 50 MG 24 hr tablet Take 1 tablet (50 mg total) by mouth daily. Take with or immediately following a meal. 12/05/15  Yes Leone Haven, MD  oxybutynin (DITROPAN) 5 MG tablet Take 1 tablet (5 mg total) by mouth 2 (two) times daily. 01/08/16  Yes Leone Haven, MD  pantoprazole (PROTONIX) 40 MG tablet Take 40 mg by mouth 2 (two) times daily. 11/21/15  Yes Historical Provider, MD  polyethylene glycol powder (GLYCOLAX/MIRALAX) powder Take 17 g by mouth 2 (two) times daily as needed. Patient taking differently: Take 17 g by mouth 2 (two) times daily as needed (takes once in the morning).  12/05/15  Yes Angela Adam  Caryl Bis, MD  traZODone (DESYREL) 50 MG tablet Take 1 tablet (50 mg total) by mouth at bedtime. 01/08/16  Yes Leone Haven, MD    Allergies:  Allergies  Allergen Reactions  . Tetanus Toxoids     Social History   Social History  . Marital Status: Widowed    Spouse Name: N/A  . Number of Children: N/A  . Years of Education: N/A   Occupational History  . Not on file.   Social History Main Topics  . Smoking status: Never Smoker   . Smokeless tobacco: Not on file  . Alcohol Use: No  . Drug Use: No  . Sexual Activity: Not on file   Other Topics Concern  . Not on file   Social History  Narrative     Family History  Problem Relation Age of Onset  . Heart disease Father   . Stroke Father   . Hypertension Father      ROS:  Please see the history of present illness.     All other systems reviewed and negative.    Physical Exam:   Blood pressure 138/60, pulse 70, height 5' (1.524 m), weight 107 lb (48.535 kg). General: Well developed, cachectic female in no acute distress. Head: Normocephalic, atraumatic, sclera non-icteric, no xanthomas, nares are without discharge. EENT: normal  Lymph Nodes:  none Neck: Negative for carotid bruits. JVD not elevated. Back:with kyphosis  Lungs: Clear bilaterally to auscultation without wheezes, rales, or rhonchi. Breathing is unlabored. Device ahs migrated and is 8 cm below L clavice Heart: RRR with S1 S2. No murmur . No rubs, or gallops appreciated. Abdomen: Soft, non-tender, non-distended with normoactive bowel sounds. No hepatomegaly. No rebound/guarding. No obvious abdominal masses. Msk:  Strength and tone appear normal for age. Extremities: No clubbing or cyanosis. tr* edema.  Distal pedal pulses are 2+ and equal bilaterally. Skin: Warm and Dry Neuro: Alert and oriented X 3. CN III-XII intact Grossly normal sensory and motor function . Psych:  Responds to questions appropriately with a normal affect.      Labs: Cardiac Enzymes No results for input(s): CKTOTAL, CKMB, TROPONINI in the last 72 hours. CBC No results found for: WBC, HGB, HCT, MCV, PLT PROTIME: No results for input(s): LABPROT, INR in the last 72 hours. Chemistry  Recent Labs Lab 01/08/16 1145  NA 132*  K 4.7  CL 98  CO2 28  BUN 16  CREATININE 0.91  CALCIUM 9.5  GLUCOSE 90   Lipids No results found for: CHOL, HDL, LDLCALC, TRIG BNP No results found for: PROBNP Thyroid Function Tests: No results for input(s): TSH, T4TOTAL, T3FREE, THYROIDAB in the last 72 hours.  Invalid input(s): FREET3 Miscellaneous No results found for:  DDIMER  Radiology/Studies:  No results found.  EKG:  Not obtained   Assessment and Plan:  Complete heart block  Persistent Atrial fibrillation  Pacemaker [employer]  Hyponatremia    Pacemaker function was normal 12/16   Battery at 1.3 years   Will have her return in 4 months  Encouraged her to try and limit fluid intake       Virl Axe

## 2016-01-10 NOTE — Telephone Encounter (Signed)
Thank you :)

## 2016-01-10 NOTE — Telephone Encounter (Signed)
[facility] called- last script for hydrocodone was given to pateint in 09/2015  Hydrocodone 7.5/325 Quantity #90 Instructions 1 po q8h prn Per Dr. Nila Nephew in [place]

## 2016-01-10 NOTE — Progress Notes (Signed)
Safety precautions to be maintained throughout the outpatient stay will include: orient to surroundings, keep bed in low position, maintain call bell within reach at all times, provide assistance with transfer out of bed and ambulation.  
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
## 2016-01-11 ENCOUNTER — Other Ambulatory Visit: Payer: Self-pay | Admitting: Pain Medicine

## 2016-01-22 DIAGNOSIS — H04542 Stenosis of left lacrimal canaliculi: Secondary | ICD-10-CM | POA: Diagnosis not present

## 2016-01-24 ENCOUNTER — Ambulatory Visit: Payer: PPO | Admitting: Pain Medicine

## 2016-02-07 ENCOUNTER — Ambulatory Visit: Payer: PPO | Attending: Pain Medicine | Admitting: Pain Medicine

## 2016-02-07 ENCOUNTER — Encounter: Payer: Self-pay | Admitting: Pain Medicine

## 2016-02-07 VITALS — BP 159/60 | HR 65 | Temp 98.1°F | Resp 16 | Ht 60.0 in | Wt 109.0 lb

## 2016-02-07 DIAGNOSIS — M5030 Other cervical disc degeneration, unspecified cervical region: Secondary | ICD-10-CM | POA: Diagnosis not present

## 2016-02-07 DIAGNOSIS — M546 Pain in thoracic spine: Secondary | ICD-10-CM | POA: Diagnosis not present

## 2016-02-07 DIAGNOSIS — M5416 Radiculopathy, lumbar region: Secondary | ICD-10-CM | POA: Diagnosis not present

## 2016-02-07 DIAGNOSIS — M5412 Radiculopathy, cervical region: Secondary | ICD-10-CM | POA: Diagnosis not present

## 2016-02-07 DIAGNOSIS — M170 Bilateral primary osteoarthritis of knee: Secondary | ICD-10-CM

## 2016-02-07 DIAGNOSIS — M47812 Spondylosis without myelopathy or radiculopathy, cervical region: Secondary | ICD-10-CM

## 2016-02-07 DIAGNOSIS — M5136 Other intervertebral disc degeneration, lumbar region: Secondary | ICD-10-CM | POA: Diagnosis not present

## 2016-02-07 DIAGNOSIS — M4186 Other forms of scoliosis, lumbar region: Secondary | ICD-10-CM | POA: Diagnosis not present

## 2016-02-07 DIAGNOSIS — M4184 Other forms of scoliosis, thoracic region: Secondary | ICD-10-CM | POA: Diagnosis not present

## 2016-02-07 DIAGNOSIS — M542 Cervicalgia: Secondary | ICD-10-CM | POA: Diagnosis not present

## 2016-02-07 DIAGNOSIS — M51369 Other intervertebral disc degeneration, lumbar region without mention of lumbar back pain or lower extremity pain: Secondary | ICD-10-CM

## 2016-02-07 DIAGNOSIS — M19011 Primary osteoarthritis, right shoulder: Secondary | ICD-10-CM

## 2016-02-07 DIAGNOSIS — M47816 Spondylosis without myelopathy or radiculopathy, lumbar region: Secondary | ICD-10-CM

## 2016-02-07 DIAGNOSIS — M5134 Other intervertebral disc degeneration, thoracic region: Secondary | ICD-10-CM

## 2016-02-07 DIAGNOSIS — M47894 Other spondylosis, thoracic region: Secondary | ICD-10-CM

## 2016-02-07 DIAGNOSIS — M19012 Primary osteoarthritis, left shoulder: Secondary | ICD-10-CM

## 2016-02-07 DIAGNOSIS — M4120 Other idiopathic scoliosis, site unspecified: Secondary | ICD-10-CM

## 2016-02-07 DIAGNOSIS — M47817 Spondylosis without myelopathy or radiculopathy, lumbosacral region: Secondary | ICD-10-CM | POA: Diagnosis not present

## 2016-02-07 DIAGNOSIS — M47814 Spondylosis without myelopathy or radiculopathy, thoracic region: Secondary | ICD-10-CM

## 2016-02-07 MED ORDER — HYDROCODONE-ACETAMINOPHEN 7.5-325 MG PO TABS: ORAL_TABLET | ORAL | Status: DC

## 2016-02-07 NOTE — Progress Notes (Signed)
   Subjective:    Patient ID: Darlene Yoder, female    DOB: 1922-11-17, 82 y.o.   MRN: [id number]  HPI  The patient is a 82 year old female who returns to pain management for further evaluation and treatment of pain involving the neck entire back lower extremity region as well as the knees. At the present time patient states the pain is fairly well controlled. The patient continues hydrocodone acetaminophen without undesirable side effects. The patient is comfortable by her daughter on today's visit we decrease the patient is tolerating medications well. The patient did admit to pain involving the lower back region as well as the deep and we discussed interventional treatment of these regions. We will avoid interventional treatment at this time and patient will continue medications as prescribed. The patient is able to perform most activities of daily living without any significant pain interfering with activities of daily living. We will consider modification of treatment regimen should there be significant change in patient's condition. All agreed to suggested treatment plan.     Review of Systems     Objective:   Physical Exam  There was minimal tenderness to palpation of paraspinal must reason cervical region cervical facet region. The patient appeared to be with unremarkable Spurling's maneuver. There was minimal tenderness of the acromioclavicular and glenohumeral joint regions. Palpation over the thoracic facet thoracic paraspinal musculature region was attends to palpation of mild to moderate degree with no crepitus of the thoracic region noted. The patient appeared to be with bilaterally equal grip strength and Tinel and Phalen's maneuver were without increase of pain of significant degree. Palpation over the lumbar paraspinal must reason lumbar facet region was attends to palpation of moderate degree. Extension and palpation of the lumbar facets reproduce moderate discomfort. There was  tenderness over the PSIS and PII S region a mild to moderate degree. There was minimal tenderness of the greater trochanteric region and iliotibial band region. The knee was attends to palpation with no increased warmth and erythema in the region of the knee. Crepitus of the knee was noted. There was negative anterior and posterior drawer signs without ballottement of the patella. EHL strength appeared to be decreased. No sensory deficit or dermatomal distribution of lower extremities noted. Straight leg raising was tolerates approximately 20. There was negative clonus negative Homans. Abdomen nontender with no costovertebral tenderness noted        Assessment & Plan:    Degenerative disc disease cervical spine  Cervical facet syndrome  Degenerative disc disease thoracic spine  Thoracic facet syndrome  Kyphoscoliosis of the thoracic oh lumbar spine  Degenerative disc disease lumbar spine  Lumbar facet syndrome  DJD (knees)    PLAN   Continue present medication Neurontin oxycodone and  fentanyl patch  F/U PCP Darlene Yoder for evaliation of  BP and general medical  condition  F/U surgical evaluation with Darlene Yoder as planned.. Patient to undergo further surgery of lumbar region   F/U neurological evaluation. May consider pending follow-up evaluations. May consider [facility] EMG studies and other studies. We will avoid such studies at this time  May consider radiofrequency rhizolysis or intraspinal procedures pending response to present treatment and F/U evaluation . We will avoid such procedures at this time  Patient to call [facility] should patient have concerns prior to scheduled return appointment.

## 2016-02-07 NOTE — Patient Instructions (Signed)
PLAN    Continue present medication hydrocodone acetaminophen provided without drowsiness confusion excessive sedation and other side effects  F/U PCP Dr.Sonnenberg for evaliation of  BP and general medical  condition  F/U surgical evaluation. May consider pending follow-up evaluations  F/U neurological evaluation. May consider pending follow-up evaluations  May consider radiofrequency rhizolysis or intraspinal procedures pending response to present treatment and F/U evaluation . We will avoid such procedures at this time  Patient to call Pain Management Center should patient have concerns prior to scheduled return appointment.

## 2016-02-07 NOTE — Progress Notes (Signed)
Safety precautions to be maintained throughout the outpatient stay will include: orient to surroundings, keep bed in low position, maintain call bell within reach at all times, provide assistance with transfer out of bed and ambulation.  

## 2016-03-06 ENCOUNTER — Ambulatory Visit (INDEPENDENT_AMBULATORY_CARE_PROVIDER_SITE_OTHER): Payer: PPO | Admitting: Family Medicine

## 2016-03-06 ENCOUNTER — Encounter: Payer: Self-pay | Admitting: Family Medicine

## 2016-03-06 ENCOUNTER — Telehealth: Payer: Self-pay | Admitting: Family Medicine

## 2016-03-06 VITALS — BP 124/76 | HR 81 | Temp 97.6°F | Ht 60.0 in | Wt 108.6 lb

## 2016-03-06 DIAGNOSIS — K219 Gastro-esophageal reflux disease without esophagitis: Secondary | ICD-10-CM

## 2016-03-06 DIAGNOSIS — E871 Hypo-osmolality and hyponatremia: Secondary | ICD-10-CM

## 2016-03-06 DIAGNOSIS — K5900 Constipation, unspecified: Secondary | ICD-10-CM

## 2016-03-06 DIAGNOSIS — L853 Xerosis cutis: Secondary | ICD-10-CM

## 2016-03-06 LAB — BASIC METABOLIC PANEL
BUN: 15 mg/dL (ref 6–23)
CO2: 28 meq/L (ref 19–32)
Calcium: 9.8 mg/dL (ref 8.4–10.5)
Chloride: 93 mEq/L — ABNORMAL LOW (ref 96–112)
Creatinine, Ser: 1.01 mg/dL (ref 0.40–1.20)
GFR: 54.52 mL/min — ABNORMAL LOW (ref >60.00)
Glucose, Bld: 100 mg/dL — ABNORMAL HIGH (ref 70–99)
Potassium: 4.8 mEq/L (ref 3.5–5.1)
Sodium: 127 mEq/L — ABNORMAL LOW (ref 135–145)

## 2016-03-06 MED ORDER — PANTOPRAZOLE SODIUM 40 MG PO TBEC: 40.0000 mg | DELAYED_RELEASE_TABLET | Freq: Every day | ORAL | Status: DC

## 2016-03-06 NOTE — Telephone Encounter (Signed)
Spoke with patient and niece regarding her hyponatremia. She does not note any balance issues, confusion, nausea, or vomiting. She states she feels at her baseline and feels well. Discussed fluid restriction. Discussed that we need to recheck this lab work next week and look for a cause of this. I advised that if anything changes over the weekend or she develops any new symptoms she needs to be evaluated immediately. They voiced understanding. We'll have nursing call next week to set up an appointment for follow-up on this.

## 2016-03-06 NOTE — Patient Instructions (Signed)
Nice to see you. We will continue the Protonix once a day. Please continue the MiraLAX once a day and Metamucil at night with her stool softeners. We'll check some lab work to evaluate her sodium. Please continued to use moisturizer for her heels. If you develop chest pain, shortness of breath, abdominal pain, inability to pass stool or gas, nausea, vomiting, or any new or changing symptoms please seek medical attention.

## 2016-03-06 NOTE — Progress Notes (Signed)
Patient ID: Darlene Yoder, female   DOB: 02-Oct-1922, 82 y.o.   MRN: [id number]  Darlene Rumps, MD Phone: [phone]  Darlene Yoder is a 82 y.o. female who presents today for follow-up.  Patient complains of dry skin on her heels. Notes there is some discomfort in these areas that is a burning sensation and stinging sensation only at night. They put lotion on her heels to help with this. She notes no decreased sensation in her feet. Has only started over the last year. Previously slept and socks and did not have a problem.  GERD: Patient is currently taking Protonix once a day as GI advised her not to take it twice a day. She notes no reflux symptoms. She does have a hiatal hernia and was advised not to do an operation due to her age. In the past she has gotten discomfort with swallowing across her chest and across her back. Only occurred when she swallowed. Has only occurred one time several months ago since she moved to [place]. Walking helps it to get better. No shortness of breath with it. No diaphoresis with it. Belches and it improves.  Hyponatremia: Patient notes no nausea or vomiting. She is drinking 32 ounces of water plus an additional about 12-16 ounces of various other fluids. She was placed on a 32 ounce or stricture him previously. She notes her urine is not dark.  Constipation: Most days she has a bowel movement. She takes 2 stool softeners at night and takes MiraLAX once a day. No abdominal pain. No blood in her stool.  PMH: nonsmoker.   ROS see history of present illness  Objective  Physical Exam Filed Vitals:   03/06/16 0953  BP: 124/76  Pulse: 81  Temp: 97.6 F (36.4 C)    BP Readings from Last 3 Encounters:  03/06/16 124/76  02/07/16 159/60  01/10/16 164/81   Wt Readings from Last 3 Encounters:  03/06/16 108 lb 9.6 oz (49.261 kg)  02/07/16 109 lb (49.442 kg)  01/10/16 107 lb (48.535 kg)    Physical Exam  Constitutional: No distress.  Alert,  mentating well  HENT:  Head: Normocephalic and atraumatic.  Right Ear: External ear normal.  Left Ear: External ear normal.  Mouth/Throat: Oropharynx is clear and moist. No oropharyngeal exudate.  Cardiovascular: Normal heart sounds.   Irregularly irregular, rate controlled  Pulmonary/Chest: Effort normal and breath sounds normal. No respiratory distress. She has no wheezes. She has no rales.  Abdominal: Soft. She exhibits no distension. There is no tenderness.  Musculoskeletal: She exhibits no edema.  Neurological: She is alert.  Skin: Skin is warm and dry. She is not diaphoretic.  Bilateral heels with no skin breakdown or erythema, nontender, no induration, no fluctuance, 2+ DP pulses     Assessment/Plan: Please see individual problem list.  History of hyponatremia Check BMP today. Not following her previously advised fluid restriction. If continues to have low sodiums would need to consider referral to nephrology.  Constipation Well-controlled at this time. Discussed continuing her current regimen. If stools become constipated she will add back twice a day MiraLAX. Given return precautions  GERD (gastroesophageal reflux disease) Well-controlled at this time. We'll continue once daily Protonix. She will continue to monitor. Given return precautions.  Dry skin Reports is on bilateral heels. Exam today is benign. Discussed continuing to monitor and use moisturizing cream for her feet. If worsens or changes or persists they will let us know.    Orders Placed This Encounter  Procedures  .  Basic Metabolic Panel (BMET)    Meds ordered this encounter  Medications  . pantoprazole (PROTONIX) 40 MG tablet    Sig: Take 1 tablet (40 mg total) by mouth daily.    Dispense:  30 tablet    Refill:  [place], MD [place]

## 2016-03-06 NOTE — Progress Notes (Signed)
Pre visit review using our clinic review tool, if applicable. No additional management support is needed unless otherwise documented below in the visit note. 

## 2016-03-07 ENCOUNTER — Ambulatory Visit: Payer: PPO | Admitting: Family Medicine

## 2016-03-09 DIAGNOSIS — K5900 Constipation, unspecified: Secondary | ICD-10-CM | POA: Insufficient documentation

## 2016-03-09 DIAGNOSIS — L853 Xerosis cutis: Secondary | ICD-10-CM | POA: Insufficient documentation

## 2016-03-09 DIAGNOSIS — K219 Gastro-esophageal reflux disease without esophagitis: Secondary | ICD-10-CM | POA: Insufficient documentation

## 2016-03-09 NOTE — Assessment & Plan Note (Signed)
Reports is on bilateral heels. Exam today is benign. Discussed continuing to monitor and use moisturizing cream for her feet. If worsens or changes or persists they will let us know.

## 2016-03-09 NOTE — Assessment & Plan Note (Signed)
Well-controlled at this time. We'll continue once daily Protonix. She will continue to monitor. Given return precautions.

## 2016-03-09 NOTE — Assessment & Plan Note (Signed)
Check BMP today. Not following her previously advised fluid restriction. If continues to have low sodiums would need to consider referral to nephrology.

## 2016-03-09 NOTE — Assessment & Plan Note (Signed)
Well-controlled at this time. Discussed continuing her current regimen. If stools become constipated she will add back twice a day MiraLAX. Given return precautions

## 2016-03-10 ENCOUNTER — Telehealth: Payer: Self-pay | Admitting: Family Medicine

## 2016-03-10 ENCOUNTER — Other Ambulatory Visit: Payer: Self-pay | Admitting: Family Medicine

## 2016-03-10 NOTE — Telephone Encounter (Signed)
Schedule patient

## 2016-03-10 NOTE — Telephone Encounter (Signed)
LM for patient to call back to schedule lab appointment for tomorrow.

## 2016-03-10 NOTE — Telephone Encounter (Signed)
Patient is coming Wednesday for repeat labs. Please place order

## 2016-03-10 NOTE — Telephone Encounter (Signed)
Pt niece called to schedule lab appt. Is it for fasting lab? Call niece @ [phone]. Thank you!

## 2016-03-11 ENCOUNTER — Telehealth: Payer: Self-pay | Admitting: Family Medicine

## 2016-03-11 ENCOUNTER — Encounter: Payer: Self-pay | Admitting: Pain Medicine

## 2016-03-11 ENCOUNTER — Ambulatory Visit: Payer: PPO | Attending: Pain Medicine | Admitting: Pain Medicine

## 2016-03-11 VITALS — BP 136/88 | HR 59 | Temp 97.1°F | Resp 14 | Ht 60.0 in | Wt 108.6 lb

## 2016-03-11 DIAGNOSIS — M4184 Other forms of scoliosis, thoracic region: Secondary | ICD-10-CM | POA: Diagnosis not present

## 2016-03-11 DIAGNOSIS — M79605 Pain in left leg: Secondary | ICD-10-CM | POA: Diagnosis not present

## 2016-03-11 DIAGNOSIS — M79604 Pain in right leg: Secondary | ICD-10-CM | POA: Diagnosis not present

## 2016-03-11 DIAGNOSIS — M47814 Spondylosis without myelopathy or radiculopathy, thoracic region: Secondary | ICD-10-CM

## 2016-03-11 DIAGNOSIS — M4120 Other idiopathic scoliosis, site unspecified: Secondary | ICD-10-CM

## 2016-03-11 DIAGNOSIS — M5030 Other cervical disc degeneration, unspecified cervical region: Secondary | ICD-10-CM | POA: Diagnosis not present

## 2016-03-11 DIAGNOSIS — M19011 Primary osteoarthritis, right shoulder: Secondary | ICD-10-CM

## 2016-03-11 DIAGNOSIS — M5416 Radiculopathy, lumbar region: Secondary | ICD-10-CM | POA: Diagnosis not present

## 2016-03-11 DIAGNOSIS — M47816 Spondylosis without myelopathy or radiculopathy, lumbar region: Secondary | ICD-10-CM

## 2016-03-11 DIAGNOSIS — M5134 Other intervertebral disc degeneration, thoracic region: Secondary | ICD-10-CM | POA: Diagnosis not present

## 2016-03-11 DIAGNOSIS — M19012 Primary osteoarthritis, left shoulder: Secondary | ICD-10-CM

## 2016-03-11 DIAGNOSIS — M170 Bilateral primary osteoarthritis of knee: Secondary | ICD-10-CM

## 2016-03-11 DIAGNOSIS — M5412 Radiculopathy, cervical region: Secondary | ICD-10-CM | POA: Diagnosis not present

## 2016-03-11 DIAGNOSIS — M47812 Spondylosis without myelopathy or radiculopathy, cervical region: Secondary | ICD-10-CM | POA: Diagnosis not present

## 2016-03-11 DIAGNOSIS — M5136 Other intervertebral disc degeneration, lumbar region: Secondary | ICD-10-CM | POA: Insufficient documentation

## 2016-03-11 DIAGNOSIS — M4186 Other forms of scoliosis, lumbar region: Secondary | ICD-10-CM | POA: Diagnosis not present

## 2016-03-11 DIAGNOSIS — M47817 Spondylosis without myelopathy or radiculopathy, lumbosacral region: Secondary | ICD-10-CM | POA: Diagnosis not present

## 2016-03-11 DIAGNOSIS — M47894 Other spondylosis, thoracic region: Secondary | ICD-10-CM

## 2016-03-11 DIAGNOSIS — M546 Pain in thoracic spine: Secondary | ICD-10-CM | POA: Diagnosis not present

## 2016-03-11 MED ORDER — HYDROCODONE-ACETAMINOPHEN 7.5-325 MG PO TABS: ORAL_TABLET | ORAL | Status: DC

## 2016-03-11 NOTE — Telephone Encounter (Signed)
Order placed
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
## 2016-03-11 NOTE — Patient Instructions (Addendum)
PLAN    Continue present medication hydrocodone acetaminophen provided without drowsiness confusion excessive sedation and other side effects  F/U PCP Dr.Sonnenberg for evaliation of  BP and general medical  condition  F/U surgical evaluation. May consider pending follow-up evaluations  F/U neurological evaluation. May consider pending follow-up evaluations  May consider radiofrequency rhizolysis or intraspinal procedures pending response to present treatment and F/U evaluation . We will avoid such procedures at this time  Patient to call [facility] Management Center should patient have concerns prior to scheduled return appointment.   A prescription for HYDROCODONE was given to you today.

## 2016-03-11 NOTE — Telephone Encounter (Addendum)
Please advise [name] @ [phone].Marland Kitchen  Has to be filed under  [id number] [facility]) File # is also claim [id number] Marylou Mccoy)

## 2016-03-12 ENCOUNTER — Other Ambulatory Visit (INDEPENDENT_AMBULATORY_CARE_PROVIDER_SITE_OTHER): Payer: PPO

## 2016-03-12 DIAGNOSIS — E871 Hypo-osmolality and hyponatremia: Secondary | ICD-10-CM

## 2016-03-12 LAB — BASIC METABOLIC PANEL
BUN: 14 mg/dL (ref 6–23)
CHLORIDE: 97 meq/L (ref 96–112)
CO2: 26 meq/L (ref 19–32)
Calcium: 9.6 mg/dL (ref 8.4–10.5)
Creatinine, Ser: 0.95 mg/dL (ref 0.40–1.20)
GFR: 58.52 mL/min — ABNORMAL LOW (ref >60.00)
GLUCOSE: 92 mg/dL (ref 70–99)
POTASSIUM: 4.5 meq/L (ref 3.5–5.1)
Sodium: 130 mEq/L — ABNORMAL LOW (ref 135–145)

## 2016-03-12 NOTE — Telephone Encounter (Signed)
Papers on dr. Caryl Bis desk

## 2016-03-13 ENCOUNTER — Other Ambulatory Visit: Payer: Self-pay | Admitting: Family Medicine

## 2016-03-13 DIAGNOSIS — E871 Hypo-osmolality and hyponatremia: Secondary | ICD-10-CM

## 2016-03-18 NOTE — Telephone Encounter (Signed)
Completed and given to [facility].

## 2016-03-19 NOTE — Telephone Encounter (Signed)
Faxed paperwork 

## 2016-03-31 ENCOUNTER — Other Ambulatory Visit: Payer: Self-pay | Admitting: Family Medicine

## 2016-03-31 DIAGNOSIS — L578 Other skin changes due to chronic exposure to nonionizing radiation: Secondary | ICD-10-CM | POA: Diagnosis not present

## 2016-03-31 DIAGNOSIS — C44612 Basal cell carcinoma of skin of right upper limb, including shoulder: Secondary | ICD-10-CM | POA: Diagnosis not present

## 2016-03-31 DIAGNOSIS — Z85828 Personal history of other malignant neoplasm of skin: Secondary | ICD-10-CM | POA: Diagnosis not present

## 2016-03-31 DIAGNOSIS — L820 Inflamed seborrheic keratosis: Secondary | ICD-10-CM | POA: Diagnosis not present

## 2016-03-31 DIAGNOSIS — D485 Neoplasm of uncertain behavior of skin: Secondary | ICD-10-CM | POA: Diagnosis not present

## 2016-03-31 DIAGNOSIS — L821 Other seborrheic keratosis: Secondary | ICD-10-CM | POA: Diagnosis not present

## 2016-03-31 DIAGNOSIS — L570 Actinic keratosis: Secondary | ICD-10-CM | POA: Diagnosis not present

## 2016-04-08 ENCOUNTER — Ambulatory Visit: Payer: PPO | Attending: Pain Medicine | Admitting: Pain Medicine

## 2016-04-08 ENCOUNTER — Encounter: Payer: Self-pay | Admitting: Pain Medicine

## 2016-04-08 VITALS — BP 134/76 | HR 80 | Temp 98.1°F | Resp 16 | Ht 60.0 in | Wt 108.0 lb

## 2016-04-08 DIAGNOSIS — M47816 Spondylosis without myelopathy or radiculopathy, lumbar region: Secondary | ICD-10-CM

## 2016-04-08 DIAGNOSIS — M79606 Pain in leg, unspecified: Secondary | ICD-10-CM | POA: Diagnosis not present

## 2016-04-08 DIAGNOSIS — M5134 Other intervertebral disc degeneration, thoracic region: Secondary | ICD-10-CM | POA: Diagnosis not present

## 2016-04-08 DIAGNOSIS — M5412 Radiculopathy, cervical region: Secondary | ICD-10-CM | POA: Diagnosis not present

## 2016-04-08 DIAGNOSIS — M47812 Spondylosis without myelopathy or radiculopathy, cervical region: Secondary | ICD-10-CM | POA: Diagnosis not present

## 2016-04-08 DIAGNOSIS — M5136 Other intervertebral disc degeneration, lumbar region: Secondary | ICD-10-CM

## 2016-04-08 DIAGNOSIS — M549 Dorsalgia, unspecified: Secondary | ICD-10-CM | POA: Diagnosis not present

## 2016-04-08 DIAGNOSIS — M5030 Other cervical disc degeneration, unspecified cervical region: Secondary | ICD-10-CM | POA: Diagnosis not present

## 2016-04-08 DIAGNOSIS — M5416 Radiculopathy, lumbar region: Secondary | ICD-10-CM | POA: Diagnosis not present

## 2016-04-08 DIAGNOSIS — M47814 Spondylosis without myelopathy or radiculopathy, thoracic region: Secondary | ICD-10-CM

## 2016-04-08 DIAGNOSIS — M47817 Spondylosis without myelopathy or radiculopathy, lumbosacral region: Secondary | ICD-10-CM | POA: Diagnosis not present

## 2016-04-08 DIAGNOSIS — M4184 Other forms of scoliosis, thoracic region: Secondary | ICD-10-CM | POA: Insufficient documentation

## 2016-04-08 DIAGNOSIS — M170 Bilateral primary osteoarthritis of knee: Secondary | ICD-10-CM

## 2016-04-08 DIAGNOSIS — M19011 Primary osteoarthritis, right shoulder: Secondary | ICD-10-CM

## 2016-04-08 DIAGNOSIS — M542 Cervicalgia: Secondary | ICD-10-CM | POA: Diagnosis not present

## 2016-04-08 DIAGNOSIS — M47894 Other spondylosis, thoracic region: Secondary | ICD-10-CM

## 2016-04-08 DIAGNOSIS — M4120 Other idiopathic scoliosis, site unspecified: Secondary | ICD-10-CM

## 2016-04-08 DIAGNOSIS — M19012 Primary osteoarthritis, left shoulder: Secondary | ICD-10-CM

## 2016-04-08 MED ORDER — HYDROCODONE-ACETAMINOPHEN 7.5-325 MG PO TABS: ORAL_TABLET | ORAL | Status: DC

## 2016-04-08 NOTE — Patient Instructions (Addendum)
PLAN    Continue present medication hydrocodone acetaminophen provided without drowsiness confusion excessive sedation and other side effects  F/U PCP Dr.Sonnenberg for evaliation of  BP and general medical  condition  F/U surgical evaluation. May consider pending follow-up evaluations  Ask the nurses and secretary the date of your appointment in physical therapy to obtain instructions for a home exercise program  F/U neurological evaluation. May consider pending follow-up evaluations  May consider radiofrequency rhizolysis or intraspinal procedures pending response to present treatment and F/U evaluation . We will avoid such procedures at this time  Patient to call Pain Management Center should patient have concerns prior to scheduled return appointment. Pain Management Discharge Instructions  General Discharge Instructions :  If you need to reach your doctor call: Monday-Friday 8:00 am - 4:00 pm at [phone] or toll free [phone].  After clinic hours [phone] to have operator reach doctor.  Bring all of your medication bottles to all your appointments in the pain clinic.  To cancel or reschedule your appointment with Pain Management please remember to call 24 hours in advance to avoid a fee.  Refer to the educational materials which you have been given on: General Risks, I had my Procedure. Discharge Instructions, Post Sedation.  Post Procedure Instructions:  The drugs you were given will stay in your system until tomorrow, so for the next 24 hours you should not drive, make any legal decisions or drink any alcoholic beverages.  You may eat anything you prefer, but it is better to start with liquids then soups and crackers, and gradually work up to solid foods.  Please notify your doctor immediately if you have any unusual bleeding, trouble breathing or pain that is not related to your normal pain.  Depending on the type of procedure that was done, some parts of your  body may feel week and/or numb.  This usually clears up by tonight or the next day.  Walk with the use of an assistive device or accompanied by an adult for the 24 hours.  You may use ice on the affected area for the first 24 hours.  Put ice in a Ziploc bag and cover with a towel and place against area 15 minutes on 15 minutes off.  You may switch to heat after 24 hours.GENERAL RISKS AND COMPLICATIONS  What are the risk, side effects and possible complications? Generally speaking, most procedures are safe.  However, with any procedure there are risks, side effects, and the possibility of complications.  The risks and complications are dependent upon the sites that are lesioned, or the type of nerve block to be performed.  The closer the procedure is to the spine, the more serious the risks are.  Great care is taken when placing the radio frequency needles, block needles or lesioning probes, but sometimes complications can occur. 1. Infection: Any time there is an injection through the skin, there is a risk of infection.  This is why sterile conditions are used for these blocks.  There are four possible types of infection. 1. Localized skin infection. 2. Central Nervous System Infection-This can be in the form of Meningitis, which can be deadly. 3. Epidural Infections-This can be in the form of an epidural abscess, which can cause pressure inside of the spine, causing compression of the spinal cord with subsequent paralysis. This would require an emergency surgery to decompress, and there are no guarantees that the patient would recover from the paralysis. 4. Discitis-This is an infection of the intervertebral  discs.  It occurs in about 1% of discography procedures.  It is difficult to treat and it may lead to surgery.        2. Pain: the needles have to go through skin and soft tissues, will cause soreness.       3. Damage to internal structures:  The nerves to be lesioned may be near blood vessels or     other nerves which can be potentially damaged.       4. Bleeding: Bleeding is more common if the patient is taking blood thinners such as  aspirin, Coumadin, Ticiid, Plavix, etc., or if he/she have some genetic predisposition  such as hemophilia. Bleeding into the spinal canal can cause compression of the spinal  cord with subsequent paralysis.  This would require an emergency surgery to  decompress and there are no guarantees that the patient would recover from the  paralysis.       5. Pneumothorax:  Puncturing of a lung is a possibility, every time a needle is introduced in  the area of the chest or upper back.  Pneumothorax refers to free air around the  collapsed lung(s), inside of the thoracic cavity (chest cavity).  Another two possible  complications related to a similar event would include: Hemothorax and Chylothorax.   These are variations of the Pneumothorax, where instead of air around the collapsed  lung(s), you may have blood or chyle, respectively.       6. Spinal headaches: They may occur with any procedures in the area of the spine.       7. Persistent CSF (Cerebro-Spinal Fluid) leakage: This is a rare problem, but may occur  with prolonged intrathecal or epidural catheters either due to the formation of a fistulous  track or a dural tear.       8. Nerve damage: By working so close to the spinal cord, there is always a possibility of  nerve damage, which could be as serious as a permanent spinal cord injury with  paralysis.       9. Death:  Although rare, severe deadly allergic reactions known as "Anaphylactic  reaction" can occur to any of the medications used.      10. Worsening of the symptoms:  We can always make thing worse.  What are the chances of something like this happening? Chances of any of this occuring are extremely low.  By statistics, you have more of a chance of getting killed in a motor vehicle accident: while driving to the hospital than any of the above occurring .   Nevertheless, you should be aware that they are possibilities.  In general, it is similar to taking a shower.  Everybody knows that you can slip, hit your head and get killed.  Does that mean that you should not shower again?  Nevertheless always keep in mind that statistics do not mean anything if you happen to be on the wrong side of them.  Even if a procedure has a 1 (one) in a 1,000,000 (million) chance of going wrong, it you happen to be that one..Also, keep in mind that by statistics, you have more of a chance of having something go wrong when taking medications.  Who should not have this procedure? If you are on a blood thinning medication (e.g. Coumadin, Plavix, see list of "Blood Thinners"), or if you have an active infection going on, you should not have the procedure.  If you are taking any blood thinners, please inform  your physician.  How should I prepare for this procedure?  Do not eat or drink anything at least six hours prior to the procedure.  Bring a driver with you .  It cannot be a taxi.  Come accompanied by an adult that can drive you back, and that is strong enough to help you if your legs get weak or numb from the local anesthetic.  Take all of your medicines the morning of the procedure with just enough water to swallow them.  If you have diabetes, make sure that you are scheduled to have your procedure done first thing in the morning, whenever possible.  If you have diabetes, take only half of your insulin dose and notify our nurse that you have done so as soon as you arrive at the clinic.  If you are diabetic, but only take blood sugar pills (oral hypoglycemic), then do not take them on the morning of your procedure.  You may take them after you have had the procedure.  Do not take aspirin or any aspirin-containing medications, at least eleven (11) days prior to the procedure.  They may prolong bleeding.  Wear loose fitting clothing that may be easy to take off and  that you would not mind if it got stained with Betadine or blood.  Do not wear any jewelry or perfume  Remove any nail coloring.  It will interfere with some of our monitoring equipment.  NOTE: Remember that this is not meant to be interpreted as a complete list of all possible complications.  Unforeseen problems may occur.  BLOOD THINNERS The following drugs contain aspirin or other products, which can cause increased bleeding during surgery and should not be taken for 2 weeks prior to and 1 week after surgery.  If you should need take something for relief of minor pain, you may take acetaminophen which is found in Tylenol,m Datril, Anacin-3 and Panadol. It is not blood thinner. The products listed below are.  Do not take any of the products listed below in addition to any listed on your instruction sheet.  A.P.C or A.P.C with Codeine Codeine Phosphate Capsules #3 Ibuprofen Ridaura  ABC compound Congesprin Imuran rimadil  Advil Cope Indocin Robaxisal  Alka-Seltzer Effervescent Pain Reliever and Antacid Coricidin or Coricidin-D  Indomethacin Rufen  Alka-Seltzer plus Cold Medicine Cosprin Ketoprofen S-A-C Tablets  Anacin Analgesic Tablets or Capsules Coumadin Korlgesic Salflex  Anacin Extra Strength Analgesic tablets or capsules CP-2 Tablets Lanoril Salicylate  Anaprox Cuprimine Capsules Levenox Salocol  Anexsia-D Dalteparin Magan Salsalate  Anodynos Darvon compound Magnesium Salicylate Sine-off  Ansaid Dasin Capsules Magsal Sodium Salicylate  Anturane Depen Capsules Marnal Soma  APF Arthritis pain formula Dewitt's Pills Measurin Stanback  Argesic Dia-Gesic Meclofenamic Sulfinpyrazone  Arthritis Bayer Timed Release Aspirin Diclofenac Meclomen Sulindac  Arthritis pain formula Anacin Dicumarol Medipren Supac  Analgesic (Safety coated) Arthralgen Diffunasal Mefanamic Suprofen  Arthritis Strength Bufferin Dihydrocodeine Mepro Compound Suprol  Arthropan liquid Dopirydamole Methcarbomol with  Aspirin Synalgos  ASA tablets/Enseals Disalcid Micrainin Tagament  Ascriptin Doan's Midol Talwin  Ascriptin A/D Dolene Mobidin Tanderil  Ascriptin Extra Strength Dolobid Moblgesic Ticlid  Ascriptin with Codeine Doloprin or Doloprin with Codeine Momentum Tolectin  Asperbuf Duoprin Mono-gesic Trendar  Aspergum Duradyne Motrin or Motrin IB Triminicin  Aspirin plain, buffered or enteric coated Durasal Myochrisine Trigesic  Aspirin Suppositories Easprin Nalfon Trillsate  Aspirin with Codeine Ecotrin Regular or Extra Strength Naprosyn Uracel  Atromid-S Efficin Naproxen Ursinus  Auranofin Capsules Elmiron Neocylate Vanquish  Axotal Emagrin Norgesic  Verin  Azathioprine Empirin or Empirin with Codeine Normiflo Vitamin E  Azolid Emprazil Nuprin Voltaren  Bayer Aspirin plain, buffered or children's or timed BC Tablets or powders Encaprin Orgaran Warfarin Sodium  Buff-a-Comp Enoxaparin Orudis Zorpin  Buff-a-Comp with Codeine Equegesic Os-Cal-Gesic   Buffaprin Excedrin plain, buffered or Extra Strength Oxalid   Bufferin Arthritis Strength Feldene Oxphenbutazone   Bufferin plain or Extra Strength Feldene Capsules Oxycodone with Aspirin   Bufferin with Codeine Fenoprofen Fenoprofen Pabalate or Pabalate-SF   Buffets II Flogesic Panagesic   Buffinol plain or Extra Strength Florinal or Florinal with Codeine Panwarfarin   Buf-Tabs Flurbiprofen Penicillamine   Butalbital Compound Four-way cold tablets Penicillin   Butazolidin Fragmin Pepto-Bismol   Carbenicillin Geminisyn Percodan   Carna Arthritis Reliever Geopen Persantine   Carprofen Gold's salt Persistin   Chloramphenicol Goody's Phenylbutazone   Chloromycetin Haltrain Piroxlcam   Clmetidine heparin Plaquenil   Cllnoril Hyco-pap Ponstel   Clofibrate Hydroxy chloroquine Propoxyphen         Before stopping any of these medications, be sure to consult the physician who ordered them.  Some, such as Coumadin (Warfarin) are ordered to prevent or  treat serious conditions such as "deep thrombosis", "pumonary embolisms", and other heart problems.  The amount of time that you may need off of the medication may also vary with the medication and the reason for which you were taking it.  If you are taking any of these medications, please make sure you notify your pain physician before you undergo any procedures.         Knee Injection A knee injection is a procedure to get medicine into your knee joint. Your health care provider puts a needle into the joint and injects medicine with an attached syringe. The injected medicine may relieve the pain, swelling, and stiffness of arthritis. The injected medicine may also help to lubricate and cushion your knee joint. You may need more than one injection. LET [facility] CARE PROVIDER KNOW ABOUT: 2. Any allergies you have. 3. All medicines you are taking, including vitamins, herbs, eye drops, creams, and over-the-counter medicines. 4. Previous problems you or members of your family have had with the use of anesthetics. 5. Any blood disorders you have. 6. Previous surgeries you have had. 7. Any medical conditions you may have. RISKS AND COMPLICATIONS Generally, this is a safe procedure. However, problems may occur, including:  Infection.  Bleeding.  Worsening symptoms.  Damage to the area around your knee.  Allergic reaction to any of the medicines.  Skin reactions from repeated injections. BEFORE THE PROCEDURE  Ask your health care provider about changing or stopping your regular medicines. This is especially important if you are taking diabetes medicines or blood thinners.  Plan to have someone take you home after the procedure. PROCEDURE  You will sit or lie down in a position for your knee to be treated.  The skin over your kneecap will be cleaned with a germ-killing solution (antiseptic).  You will be given a medicine that numbs the area (local anesthetic). You may feel some  stinging.  After your knee becomes numb, you will have a second injection. This is the medicine. This needle is carefully placed between your kneecap and your knee. The medicine is injected into the joint space.  At the end of the procedure, the needle will be removed.  A bandage (dressing) may be placed over the injection site. The procedure may vary among health care providers and hospitals. AFTER THE PROCEDURE  12. You may have to move your knee through its full range of motion. This helps to get all of the medicine into your joint space. 13. Your blood pressure, heart rate, breathing rate, and blood oxygen level will be monitored often until the medicines you were given have worn off. 14. You will be watched to make sure that you do not have a reaction to the injected medicine.   This information is not intended to replace advice given to you by your health care provider. Make sure you discuss any questions you have with your health care provider.   Document Released: 02/01/2007 Document Revised: 12/01/2014 Document Reviewed: 09/20/2014 Elsevier Interactive Patient Education [employer].

## 2016-04-08 NOTE — Progress Notes (Signed)
Safety precautions to be maintained throughout the outpatient stay will include: orient to surroundings, keep bed in low position, maintain call bell within reach at all times, provide assistance with transfer out of bed and ambulation.  

## 2016-04-08 NOTE — Progress Notes (Signed)
Subjective:    Patient ID: Darlene Yoder, female    DOB: May 19, 1922, 82 y.o.   MRN: [id number]  HPI  The patient is a 82 year old female who returns to pain management for further evaluation and treatment of pain involving the neck upper back shoulders entire back and lower extremity regions. On today's visit the patient stated that she has significant pain involving the region of the right knee. The patient stated the pain has been so significant that she was unable to attend church. We discussed patient's condition and informed patient that we will consider injection of the knee if the patient wished to proceed with injection of the knee. We discussed both geniculate nerve blocks as well as intra-articular injection of the knee. The patient also stated that her headache was being held more forward as time goes on. We discussed patient going to physical therapy to discuss her body posture especially the forward positioning of the head and neck and for patient to be considered for home exercise program in attempt to Retard progression of the patient's condition and of the cervical region with head being held forward and to minimize severity of symptoms and avoid the need for more involved treatment. We we'll schedule patient for physical therapy evaluation with recommendations for home exercise program. The patient denied any other significant changes in her condition. The patient is tolerating medication hydrocodone acetaminophen and well without undesirable side effects. The patient was accompanied by her daughter on today's visit we stated that the patient was without any significant side effects due to the medication. We discussed application of creams to the knee as well as liquids being applied to the knee and discussed the use of Voltaren gel as well as Pennsaid. We will remain available to consider patient for modification of treatment regimen including interventional treatment. The patient is to call  pain management should she wishes to proceed with interventional treatment prior to scheduled return appointment and patient will continue hydrocodone acetaminophen as prescribed at this time. All agreed to suggested treatment plan     Review of Systems     Objective:   Physical Exam   There was tenderness over the splenius capitis and occipitalis region a mild degree there was tenderness over the region of the cervical facet cervical paraspinal muscular region. The patient held the head anterior to the mid sagittal plane. There was tenderness of the acromioclavicular and glenohumeral joint region a mild to moderate degree. There was moderate tends to palpation of the trapezius levator And rhomboid musculature regions. The patient appeared to be with slightly decreased grip strength with Tinel and Phalen's maneuver reproducing mild discomfort. Palpation over the thoracic region thoracic facet region was attends to palpation of moderate degree. No crepitus of the thoracic region was noted. Palpation of the lumbar paraspinal must reason lumbar facet region associated with moderate discomfort. There was tenderness of the PSIS and PII S region as well as as the greater trochanteric region iliotibial band region. Straight leg raise was tolerates approximately 30 without a definite increased pain with dorsiflexion noted. EHL strength appeared to be decreased. No definite sensory deficit of dermatomal dystrophy detected. There was negative clonus negative Homans. Abdomen nontender with no costovertebral tenderness noted     Assessment & Plan:      Degenerative disc disease cervical spine  Cervical facet syndrome  Degenerative disc disease thoracic spine  Thoracic facet syndrome  Kyphoscoliosis of the thoracic oh lumbar spine  Degenerative disc disease lumbar spine  Lumbar facet syndrome      PLAN    Continue present medication hydrocodone acetaminophen provided without drowsiness  confusion excessive sedation and other side effects  F/U PCP Dr.Sonnenberg for evaliation of  BP and general medical  condition  F/U surgical evaluation. May consider pending follow-up evaluations  Ask the nurses and secretary the date of your appointment in physical therapy to obtain instructions for a home exercise programIn attempt to correct body posturing with heated being held forward to the mid sagittal plane as well as for strengthening of muscles of the lower extremity to Decrease pain of the knee especially the right lower extremity and right knee  F/U neurological evaluation. May consider pending follow-up evaluations  May consider radiofrequency rhizolysis or intraspinal procedures pending response to present treatment and F/U evaluation . We will avoid such procedures at this time  Patient to call Pain Management Center should patient have concerns prior to scheduled return appointment.

## 2016-04-24 DIAGNOSIS — E871 Hypo-osmolality and hyponatremia: Secondary | ICD-10-CM | POA: Diagnosis not present

## 2016-04-30 ENCOUNTER — Other Ambulatory Visit: Payer: Self-pay | Admitting: Family Medicine

## 2016-05-06 ENCOUNTER — Encounter: Payer: Self-pay | Admitting: Internal Medicine

## 2016-05-06 ENCOUNTER — Ambulatory Visit: Payer: PPO | Attending: Pain Medicine | Admitting: Pain Medicine

## 2016-05-06 ENCOUNTER — Ambulatory Visit (INDEPENDENT_AMBULATORY_CARE_PROVIDER_SITE_OTHER): Payer: PPO | Admitting: *Deleted

## 2016-05-06 ENCOUNTER — Encounter: Payer: Self-pay | Admitting: Pain Medicine

## 2016-05-06 VITALS — BP 150/78 | HR 80 | Temp 97.9°F | Resp 16 | Ht 60.0 in | Wt 111.0 lb

## 2016-05-06 DIAGNOSIS — M4184 Other forms of scoliosis, thoracic region: Secondary | ICD-10-CM | POA: Diagnosis not present

## 2016-05-06 DIAGNOSIS — M47817 Spondylosis without myelopathy or radiculopathy, lumbosacral region: Secondary | ICD-10-CM | POA: Diagnosis not present

## 2016-05-06 DIAGNOSIS — M5136 Other intervertebral disc degeneration, lumbar region: Secondary | ICD-10-CM | POA: Diagnosis not present

## 2016-05-06 DIAGNOSIS — Z950 Presence of cardiac pacemaker: Secondary | ICD-10-CM

## 2016-05-06 DIAGNOSIS — M47812 Spondylosis without myelopathy or radiculopathy, cervical region: Secondary | ICD-10-CM

## 2016-05-06 DIAGNOSIS — M5030 Other cervical disc degeneration, unspecified cervical region: Secondary | ICD-10-CM | POA: Diagnosis not present

## 2016-05-06 DIAGNOSIS — M79606 Pain in leg, unspecified: Secondary | ICD-10-CM | POA: Diagnosis not present

## 2016-05-06 DIAGNOSIS — M5134 Other intervertebral disc degeneration, thoracic region: Secondary | ICD-10-CM

## 2016-05-06 DIAGNOSIS — I4891 Unspecified atrial fibrillation: Secondary | ICD-10-CM

## 2016-05-06 DIAGNOSIS — M4186 Other forms of scoliosis, lumbar region: Secondary | ICD-10-CM | POA: Diagnosis not present

## 2016-05-06 DIAGNOSIS — M546 Pain in thoracic spine: Secondary | ICD-10-CM | POA: Diagnosis not present

## 2016-05-06 DIAGNOSIS — M5416 Radiculopathy, lumbar region: Secondary | ICD-10-CM | POA: Diagnosis not present

## 2016-05-06 DIAGNOSIS — M5412 Radiculopathy, cervical region: Secondary | ICD-10-CM | POA: Diagnosis not present

## 2016-05-06 DIAGNOSIS — M542 Cervicalgia: Secondary | ICD-10-CM | POA: Diagnosis not present

## 2016-05-06 DIAGNOSIS — M47814 Spondylosis without myelopathy or radiculopathy, thoracic region: Secondary | ICD-10-CM

## 2016-05-06 DIAGNOSIS — M4120 Other idiopathic scoliosis, site unspecified: Secondary | ICD-10-CM

## 2016-05-06 DIAGNOSIS — M47894 Other spondylosis, thoracic region: Secondary | ICD-10-CM

## 2016-05-06 DIAGNOSIS — M170 Bilateral primary osteoarthritis of knee: Secondary | ICD-10-CM

## 2016-05-06 DIAGNOSIS — M47816 Spondylosis without myelopathy or radiculopathy, lumbar region: Secondary | ICD-10-CM

## 2016-05-06 LAB — CUP PACEART INCLINIC DEVICE CHECK
Battery Voltage: 2.72 V
Brady Statistic RV Percent Paced: 98 %
Lead Channel Impedance Value: 400 Ohm
Lead Channel Impedance Value: 600 Ohm
Lead Channel Pacing Threshold Amplitude: 0.75 V
Lead Channel Pacing Threshold Pulse Width: 0.5 ms
Lead Channel Sensing Intrinsic Amplitude: 1.4 mV
Lead Channel Setting Pacing Amplitude: 2.5 V
Lead Channel Setting Pacing Pulse Width: 0.5 ms
MDC IDC MSMT BATTERY REMAINING LONGEVITY: 13.2
MDC IDC PG SERIAL: 2198848
MDC IDC SESS DTM: 20170613120948
MDC IDC SET LEADCHNL RA PACING AMPLITUDE: 2.5 V
MDC IDC SET LEADCHNL RV SENSING SENSITIVITY: 1.5 mV
MDC IDC STAT BRADY RA PERCENT PACED: 27 %
Pulse Gen Model: 2210

## 2016-05-06 MED ORDER — HYDROCODONE-ACETAMINOPHEN 7.5-325 MG PO TABS: ORAL_TABLET | ORAL | Status: DC

## 2016-05-06 NOTE — Progress Notes (Signed)
Pacemaker check in clinic. Normal device function. Thresholds, sensing, impedances consistent with previous measurements. Device programmed to maximize longevity. 1,020 mode switches (74% burden), AF +Eliquis, one available EGM suggests A noise. No high ventricular rates noted. 1 noise reversion--no EGM. PVAB reprogrammed from 134ms to 127ms for better AF detection. Device programmed at appropriate safety margins. Histogram distribution appropriate for patient activity level. Device programmed to optimize intrinsic conduction. Estimated longevity 12.1 months. Patient education completed. ROV with [place] device [facility] on 08/19/16 for battery check (billable) and with [facility] in 12/2016.

## 2016-05-06 NOTE — Progress Notes (Signed)
Safety precautions to be maintained throughout the outpatient stay will include: orient to surroundings, keep bed in low position, maintain call bell within reach at all times, provide assistance with transfer out of bed and ambulation.  

## 2016-05-06 NOTE — Patient Instructions (Addendum)
PLAN    Continue present medication hydrocodone acetaminophen provided without drowsiness confusion excessive sedation and other side effects  F/U PCP Dr.Sonnenberg for evaliation of  BP and general medical  condition  F/U surgical evaluation. May consider pending follow-up evaluations  Appointment in physical therapy to obtain instructions for a home exercise program as scheduled this week  F/U neurological evaluation. May consider pending follow-up evaluations  May consider radiofrequency rhizolysis or intraspinal procedures pending response to present treatment and F/U evaluation . We will avoid such procedures at this time  Patient to call Pain Management Center should patient have concerns prior to scheduled return appointment.

## 2016-05-06 NOTE — Progress Notes (Signed)
   Subjective:    Patient ID: Darlene Yoder, female    DOB: 09/12/1922, 82 y.o.   MRN: [id number]  HPI  The patient is a 82 year old female who returns to pain management for further evaluation and treatment of pain involving the neck entire back upper and lower extremity regions. The patient is with pain involving multiple regions and states that the pain is fairly well-controlled at this time. We have discussed interventional treatment with the patient and patient's daughter and at the present time all agreed that the present medication of hydrocodone acetaminophen appears to be controlling patient's pain fairly well. The patient denies any trauma change in events of daily living the call significant change in symptomatology management as prescribed at this time and patient is call pain medicine Center prior to scheduled return appointment should there be significant change in condition. All agreed to suggested treatment plan         Review of Systems     Objective:   Physical Exam   Palpation of the splenius capitis and occipitalis regions reproduce moderate discomfort. There was limited range of motion of the cervical spine with unremarkable Spurling's maneuver. There were no masses of the hip negative noted. Palpation over the region of the thoracic facet thoracic paraspinal musculature region was attends to palpation of moderate degree with kyphosis of the thoracic spine noted. Palpation of the thoracic paraspinal musculature region was with no crepitus of the thoracic region noted. Palpation over the lumbar paraspinal muscular treat and lumbar facet region was with moderate tenderness to palpation. Lateral bending rotation extension and palpation of the lumbar facets reproduce moderate discomfort. There was moderate tenderness of the PSIS and PII S region and mild to moderate tenderness of the greater trochanteric region iliotibial band region. Straight leg raising was tolerated to 30  without increased pain with dorsiflexion noted. EHL strength appeared to be decreased slightly. There was no sensory deficit or dermatomal distribution detected. There was negative clonus negative Homans. Abdomen nontender with no costovertebral tenderness noted     Assessment & Plan:      Degenerative disc disease cervical spine  Cervical facet syndrome  Degenerative disc disease thoracic spine  Thoracic facet syndrome  Kyphoscoliosis of the thoracic oh lumbar spine  Degenerative disc disease lumbar spine  Lumbar facet syndrome      PLAN    Continue present medication hydrocodone acetaminophen provided without drowsiness confusion excessive sedation and other side effects  F/U PCP Dr.Sonnenberg for evaliation of  BP and general medical  condition  F/U surgical evaluation. May consider pending follow-up evaluations patient prefers to avoid surgical evaluation  Appointment in physical therapy to obtain instructions for a home exercise program as as discussed  F/U neurological evaluation. May consider pending follow-up evaluations  May consider radiofrequency rhizolysis or intraspinal procedures pending response to present treatment and F/U evaluation . We will avoid such procedures at this time  Patient to call Pain Management Center should patient have concerns prior to scheduled return appointment.

## 2016-05-07 ENCOUNTER — Ambulatory Visit: Payer: PPO | Attending: Pain Medicine | Admitting: Physical Therapy

## 2016-05-07 ENCOUNTER — Encounter: Payer: Self-pay | Admitting: Physical Therapy

## 2016-05-07 DIAGNOSIS — R262 Difficulty in walking, not elsewhere classified: Secondary | ICD-10-CM | POA: Insufficient documentation

## 2016-05-07 DIAGNOSIS — M25511 Pain in right shoulder: Secondary | ICD-10-CM | POA: Diagnosis not present

## 2016-05-07 DIAGNOSIS — M545 Low back pain, unspecified: Secondary | ICD-10-CM

## 2016-05-07 DIAGNOSIS — M25561 Pain in right knee: Secondary | ICD-10-CM | POA: Diagnosis not present

## 2016-05-07 DIAGNOSIS — M6281 Muscle weakness (generalized): Secondary | ICD-10-CM | POA: Diagnosis not present

## 2016-05-07 DIAGNOSIS — E871 Hypo-osmolality and hyponatremia: Secondary | ICD-10-CM | POA: Diagnosis not present

## 2016-05-07 NOTE — Patient Instructions (Addendum)
Elevation (Active)    Shrug shoulders up, breathing in. Relax, breathing out. Dull pain-its ok to keep exercising  Sharp pain- do not do exercise Repeat _15___ times. Do __2__ sessions per day.  Copyright  VHI. All rights reserved.  Elevation (Active)    Shrug shoulders up, breathing in. Relax, breathing out. Repeat 15____ times. Do __1__ sessions per day.  Copyright  VHI. All rights reserved.  Elevation (Active)   Copyright  VHI. All rights reserved.  Pectoralis Stretch: Supine (Towel Roll)    Lie with rolled towel under spine, letting shoulders relax toward floor. Lie with arms out to the side, palms facing up  Lie _5__ minutes. Do _1 times per day.  http://ss.exer.us/355   Copyright  VHI. All rights reserved.  Arm Curl    Sit or stand with feet shoulder width apart, arms straight down at sides, palms forward. Inhale, then exhale while slowly curling weights toward shoulders and keeping elbows touching torso. Slowly return to starting position. Repeat __10__ times per set. Do __1__ sets per session. Do _5___ sessions per week. May be done with dumbbells, tubing or resistive band.    Copyright  VHI. All rights reserved. Knee to Chest (Flexion)   Pull knee toward chest. Feel stretch in lower back or buttock area. Breathing deeply, Hold ___15_ seconds. Repeat with other knee. Repeat __2__ times. Do __2__ sessions per day.  http://[email or web address].exer.us/225   Copyright  VHI. All rights reserved.   Lower Trunk Rotation Stretch   Keeping back flat and feet together, rotate knees to left side. Hold __2__ seconds. Repeat _10___ times per set. Do __2__ sets per session. Do ___2_ sessions per day.  http://orth.exer.us/122     Low Row: Standing    Face anchor, feet shoulder width apart. Palms up, pull arms back, squeezing shoulder blades together. Repeat _10_ times per set. Do 2__ sets per session. Do _2_ sessions per week. Anchor Height:  Waist  http://tub.exer.us/66   Copyright  VHI. All rights reserved.  Resisted Horizontal Abduction: Bilateral    Sit or stand, tubing in both hands, arms out in front. Keeping arms straight, pinch shoulder blades together and stretch arms out. Repeat __10__ times per set. Do _2_ sets per session. Do __2__ sessions per day.  http://orth.exer.us/969   Copyright  VHI. All rights reserved.

## 2016-05-07 NOTE — Therapy (Signed)
[place] MAIN [facility] SERVICES [address] [place], [place], [address] Phone: [phone]   Fax:  [phone]  Physical Therapy Evaluation  Patient Details  Name: Darlene Yoder MRN: [id number] Date of Birth: 04-24-1922 Referring Provider: Mohammed Kindle  Encounter Date: 05/07/2016      PT End of Session - 05/07/16 1416    Visit Number 1   Number of Visits 2   Date for PT Re-Evaluation 07/02/16   Authorization Type Gcode 1   Authorization Time Period 10    PT Start Time 1015   PT Stop Time 1120   PT Time Calculation (min) 65 min   Equipment Utilized During Treatment Gait belt   Activity Tolerance Patient tolerated treatment well;Patient limited by pain   Behavior During Therapy [facility] for tasks assessed/performed      Past Medical History  Diagnosis Date  . Arthritis   . Chickenpox   . GERD (gastroesophageal reflux disease)   . Atrial fibrillation ([place])   . Hypertension   . CKD (chronic kidney disease)   . Urge incontinence   . UTI (lower urinary tract infection)   . Pacemaker   . Low sodium levels   . Carcinoma of the skin, basal cell     Past Surgical History  Procedure Laterality Date  . Appendectomy    . Abdominal hysterectomy    . Pacemaker insertion  12/25/2008    [employer] pacemaker DRRF 2210 Serial [id number]  . Hemorrhoid surgery    . Tonsillectomy    . Skin surgery      removal of a mass on cheek  . Basal cell carcinoma excision Left     cheek  . Coronary angiogram  2015    [place]   . Small intestine surgery  2016  . Broken pelvis    . Coronary angiogram  2015  . Cardiac catheterization    . Pacemaker placement  2005  . Bunionectomy Bilateral     There were no vitals filed for this visit.       Subjective Assessment - 05/07/16 1026    Subjective Pt is 82 year old female coming to physical therapy for the goal of an HEP that she can complete at home with her niece.  Pt reports consistent neck, shoulder, back,  and R knee pain.    Pt reports that if she turns her neck she turns her entire body and that her neck makes a popping sound. Pts niece who is her primary caregiver reports she has poor circulation in RLE but patient dies any N/T.    Functional, pt reports needing assistance for showering, food prepartion, doing her hair, and putting on her compression stockings. She uses her RW around the house without assistance and also uses a RW to go to resturants, doctors appointments and her wheelchair for longer community distances.  Pt has experienced physical therapy in the past and exercises that have "aggravated her symptoms".  She reports having a busy schedule with her other doctors appointments and want to find exercises to do to keep her as mobile as possible at home.     Patient is accompained by: Family member   Pertinent History Personal factors affecting rehab: age, comlexity of symptoms, widespread OA, comorbidies, posture    Limitations Standing;Walking;Lifting;House hold activities   How long can you sit comfortably? N/A    How long can you stand comfortably? needs to be moving to be comfortable standing for periods of time  How long can you walk comfortably? household distances and limited community distances ie. resturants, doctors appointments etc. uses wheelchair to go shopping    Diagnostic tests N/A    Patient Stated Goals exercises to do at home, being able to turn head better    Currently in Pain? Yes   Pain Score 8   everywhere, neck, arm, shoulder, knees, toes   Pain Descriptors / Indicators Sharp   Pain Type Chronic pain   Pain Onset More than a month ago   Pain Frequency Constant   Aggravating Factors  amount of activity, being out of the house for long periods of time    Pain Relieving Factors pain medication, adjusting positions in her chair etc.  lying down and elevate legs    Effect of Pain on Daily Activities limits amount of activity, needs to rest, lie with her feet up  etc.    Multiple Pain Sites Yes            [facility] PT Assessment - 05/07/16 0001    Assessment   Medical Diagnosis Generalized weakness, diffuse pain    Referring Provider Mohammed Kindle   Onset Date/Surgical Date --  Chronic, years ago    Hand Dominance Right   Next MD Visit July 2017   Prior Therapy Back, shoulder (2 months) in december after hospital    Precautions   Precautions Fall   Restrictions   Weight Bearing Restrictions No   Balance Screen   Has the patient fallen in the past 6 months Yes   How many times? 2  both falls during her episode of hyponatremia    Has the patient had a decrease in activity level because of a fear of falling?  Yes   Is the patient reluctant to leave their home because of a fear of falling?  No  careful    Home Environment   Additional Comments Lives with niece Darlene Yoder), 1 story house, 4 stairs to enter, 1 railing to enter on the L and goes up sideways, handicap shower    Prior Function   Level of Independence Requires assistive device for independence;Needs assistance with ADLs;Needs assistance with homemaking   Vocation Retired   Leisure reading,    Cognition   Overall Cognitive Status Within Functional Limits for tasks assessed   Attention Focused   Observation/Other Assessments   Observations pleasant, sharp, 82 year old, rests with hands in lap and arms on armrests   Sensation   Light Touch Appears Intact   Coordination   Gross Motor Movements are Fluid and Coordinated Yes   Fine Motor Movements are Fluid and Coordinated Yes   Functional Tests   Functional tests Sit to Stand   Sit to Stand   Comments able to stand without physical assistance, with support of RW, pt is very cautious when going to sit, unable to look up and stand tall due to excessive thoracic kyphosis and forward head posture   Posture/Postural Control   Posture/Postural Control Postural limitations   Postural Limitations Rounded Shoulders;Forward head;Increased  thoracic kyphosis   ROM / Strength   AROM / PROM / Strength AROM   AROM   Overall AROM  Deficits   Overall AROM Comments functional but grossly limited UE ROM, unable to reach behind head to do hair: functional LE AROM with painful R hip flexion and R knee extension   Strength   Overall Strength Comments grip strength equal and strong bilaterally, grossly decreased strength in BLEs, RLE grossly 3+/5  partially due to pain and LLE 4-/5    Ambulation/Gait   Ambulation/Gait Yes   Ambulation/Gait Assistance 5: Supervision   Assistive device Rolling walker   Gait Comments ambulates with RW and supervision with forward flexed posture, decreased stride length and decreased hip extension, pt unable to look forward while walking due to cervical restrictions    Standardized Balance Assessment   10 Meter Walk 0.75m/s:  >0.4 m/s=household ambulator, >0.72m/s=limited community ambulator  RW, supervision    High Level Balance   High Level Balance Comments able to stand for >30 seconds without AD or assistance without LOB, able to stand with CGA eyes closed for 20 seconds without LOB       Treatment Eduated pt and niece on proper sitting and standing posture, purpose of lumbar roll  Extensive HEP instructed and demonstrated.  Including lumbar trunk rotation; single knee to chest (with cues to avoid excessive knee flexion); Seated scapular retraction (patient attempted but had difficulty); Shoulder elevation/depression with cues to focus on depression; patient reports increased discomfort with exercise;   Informed pt to discontinue exercises if they cause sharp pains.  See pt. Instructions                     PT Education - 05/07/16 1108    Education provided Yes   Education Details Posture, resting with arms down, HEP, lumbar roll, heat patches for cervical spine    Person(s) Educated Patient;Caregiver(s)   Methods Explanation;Demonstration;Verbal cues   Comprehension Verbalized  understanding;Returned demonstration             PT Long Term Goals - 05/07/16 1428    PT LONG TERM GOAL #1   Title Pt will be independent in HEP in order to gain more functional mobility    Time 8   Period Weeks   Status New               Plan - 05/07/16 1417    Clinical Impression Statement Pt is 82 year old female presenting to physical therapy with complaints of diffuse pain throughout her body which increases with activity.  Pt presents with functional, but decreased gross upper extremity AROM and functional LE range of motion.  Pt demonstrates grossly 4-/5 on LLE and 3+/5 on RLE with increased pain on RLE.  Pt is able to transfer with supervision and assist from [place] without LOB and demonstrates good safety awareness.  Pt presents with excessive thoracic kyphosis, forward head, and rounded shoulders that is unable to be corrected in standing.  She ambulates at a supervision level with RW demonstrating similar posture to sitting with decreased stride length and decreased hip extension.  Her gait speed of 0.45 m/s puts her at a household amublator and therefore increased risk of falls.  Pt and niece were well educated on seated and standing posture as well as extensive exercises to complete at home.  Pt wishes to only perform HEP for now but will schedule more appointments if she deems neccesary.  She would benefit from skilled physical therapy to decrease pain, increase LE strength, and balance for greater functional mobility.     Rehab Potential Fair   Clinical Impairments Affecting Rehab Potential Positive: motivated, good support system, negative: age, comorbidies, chronic pain,  clinical impression: evolving due to pain in multiple sites, progressive condition and high fall risk;    PT Frequency Other (comment)  1 additional visit if desired;   PT Duration 8 weeks   PT Treatment/Interventions  ADLs/Self Care Home Management;Moist Heat;Therapeutic exercise;Therapeutic  activities;Balance training;Energy conservation;Neuromuscular re-education;Cryotherapy;Functional mobility training;Gait training;Patient/family education   PT Next Visit Plan review HEP, progress strengthening, postural training    PT Home Exercise Plan see pt instructions    Consulted and Agree with Plan of Care Patient;Family member/caregiver   Family Member Consulted Niece Darlene Yoder)       Patient will benefit from skilled therapeutic intervention in order to improve the following deficits and impairments:  Decreased activity tolerance, Decreased balance, Decreased mobility, Decreased endurance, Decreased strength, Difficulty walking, Impaired flexibility, Impaired UE functional use, Improper body mechanics, Postural dysfunction  Visit Diagnosis: Muscle weakness (generalized) - Plan: PT plan of care cert/re-cert  Midline low back pain without sciatica - Plan: PT plan of care cert/re-cert  Pain in right shoulder - Plan: PT plan of care cert/re-cert  Pain in right knee - Plan: PT plan of care cert/re-cert  Difficulty in walking, not elsewhere classified - Plan: PT plan of care cert/re-cert      G-Codes - [phone]    Functional Assessment Tool Used 10 meter walk, strength, clinical judgement;   Functional Limitation Mobility: Walking and moving around   Mobility: Walking and Moving Around Current Status [phone]) At least 40 percent but less than 60 percent impaired, limited or restricted   Mobility: Walking and Moving Around Goal Status [phone]) At least 20 percent but less than 40 percent impaired, limited or restricted       Problem List Patient Active Problem List   Diagnosis Date Noted  . Constipation 03/09/2016  . GERD (gastroesophageal reflux disease) 03/09/2016  . Dry skin 03/09/2016  . History of hyponatremia 01/08/2016  . Sleeping difficulty 01/08/2016  . Presence of permanent cardiac pacemaker 12/07/2015  . Atrial fibrillation ([place]) 12/05/2015  . Abrasion of second  toe, right 12/05/2015  . History of basal cell carcinoma of skin 12/05/2015  . Chronic pain syndrome 12/05/2015  . Blocked tear duct 12/05/2015  . Essential hypertension 12/05/2015  . Urge incontinence 12/05/2015   Stacy Gardner, SPT  This entire session was performed under direct supervision and direction of a licensed therapist/therapist assistant . I have personally read, edited and approve of the note as written.  Trotter,Margaret PT, DPT 05/07/2016, 4:14 PM  [place] MAIN [facility] SERVICES [address] [place], [place], [address] Phone: [phone]   Fax:  [phone]  Name: Darlene Yoder MRN: [id number] Date of Birth: Feb 16, 1922

## 2016-05-08 ENCOUNTER — Encounter: Payer: PPO | Admitting: Pain Medicine

## 2016-05-09 ENCOUNTER — Encounter: Payer: Self-pay | Admitting: Family Medicine

## 2016-05-09 ENCOUNTER — Ambulatory Visit (INDEPENDENT_AMBULATORY_CARE_PROVIDER_SITE_OTHER): Payer: PPO | Admitting: Family Medicine

## 2016-05-09 VITALS — BP 112/74 | HR 88 | Temp 98.2°F | Ht 60.0 in

## 2016-05-09 DIAGNOSIS — I10 Essential (primary) hypertension: Secondary | ICD-10-CM

## 2016-05-09 DIAGNOSIS — E871 Hypo-osmolality and hyponatremia: Secondary | ICD-10-CM

## 2016-05-09 DIAGNOSIS — I4891 Unspecified atrial fibrillation: Secondary | ICD-10-CM

## 2016-05-09 DIAGNOSIS — L97509 Non-pressure chronic ulcer of other part of unspecified foot with unspecified severity: Secondary | ICD-10-CM | POA: Insufficient documentation

## 2016-05-09 DIAGNOSIS — L97521 Non-pressure chronic ulcer of other part of left foot limited to breakdown of skin: Secondary | ICD-10-CM

## 2016-05-09 LAB — BASIC METABOLIC PANEL
BUN: 16 mg/dL (ref 6–23)
CALCIUM: 10 mg/dL (ref 8.4–10.5)
CO2: 28 mEq/L (ref 19–32)
CREATININE: 1.01 mg/dL (ref 0.40–1.20)
Chloride: 95 mEq/L — ABNORMAL LOW (ref 96–112)
GFR: 54.5 mL/min — AB (ref >60.00)
GLUCOSE: 93 mg/dL (ref 70–99)
Potassium: 4.6 mEq/L (ref 3.5–5.1)
SODIUM: 130 meq/L — AB (ref 135–145)

## 2016-05-09 LAB — CBC
HCT: 37.2 % (ref 36.0–46.0)
Hemoglobin: 12.6 g/dL (ref 12.0–15.0)
MCHC: 33.7 g/dL (ref 30.0–36.0)
MCV: 92 fl (ref 78.0–100.0)
Platelets: 272 10*3/uL (ref 150.0–400.0)
RBC: 4.04 Mil/uL (ref 3.87–5.11)
RDW: 13.6 % (ref 11.5–15.5)
WBC: 6.1 10*3/uL (ref 4.0–10.5)

## 2016-05-09 LAB — SEDIMENTATION RATE: SED RATE: 23 mm/h (ref 0–30)

## 2016-05-09 LAB — C-REACTIVE PROTEIN: CRP: 0.1 mg/dL — AB (ref 0.5–20.0)

## 2016-05-09 NOTE — Progress Notes (Signed)
Patient ID: Darlene Yoder, female   DOB: 05/22/1922, 82 y.o.   MRN: [id number]  Tommi Rumps, MD Phone: [phone]  Darlene Yoder is a 82 y.o. female who presents today for same day visit.  Left second toe ulceration: Patient reports 2-3 days ago developing some irritation on the dorsal aspect of the toe. Mild ulceration. Did have a little bit of drainage the first day though none since then. No erythema. The toe does appear somewhat purple proximally though distally is pink. No pain. No numbness. No fevers. No chills. Did see vascular surgery previously for her right foot and the plan was to have evaluation for PAD. She notes this area of irritation developed after wearing a pad for her hammertoe.  Hyponatremia: Patient notes no nausea or vomiting. No confusion. Trying to limit fluid intake to 32 ounces. Did see nephrology for this.  Hypertension: Notes blood pressures typically 120s over 70s. Taking metoprolol. No chest pain. No significant shortness of breath. Breathing increases minimally with a fair amount of exertion.  Does have A. fib. She is followed by cardiology for this. Just had a pacemaker adjustment made. No palpitations.   PMH: nonsmoker.   ROS see history of present illness  Objective  Physical Exam Filed Vitals:   05/09/16 0907  BP: 112/74  Pulse: 88  Temp: 98.2 F (36.8 C)    BP Readings from Last 3 Encounters:  05/09/16 112/74  05/06/16 150/78  04/08/16 134/76   Wt Readings from Last 3 Encounters:  05/06/16 111 lb (50.349 kg)  04/08/16 108 lb (48.988 kg)  03/11/16 108 lb 9.6 oz (49.261 kg)    Physical Exam  Constitutional: She is well-developed, well-nourished, and in no distress.  HENT:  Head: Normocephalic and atraumatic.  Right Ear: External ear normal.  Left Ear: External ear normal.  Cardiovascular: Normal rate, regular rhythm and normal heart sounds.   Pulmonary/Chest: Effort normal and breath sounds normal.  Musculoskeletal:  Left second  toe evaluated with minimal skin breakdown on the dorsal aspect, proximal part of toe and foot does appear slightly purple, there is no drainage from the area, there is no tenderness in the area, there is good sensation in the area, good capillary refill at the tip of the toe, good capillary refill elsewhere in the foot, 2+ DP pulses in the left foot right foot, right second toe appears similar to left second toe with callus formation or slowly, both feet are warm, no erythema, no induration, no fluctuance  Neurological: She is alert.     Assessment/Plan: Please see individual problem list.  Toe ulcer ([employer]) Area of irritation on dorsal aspect of left second toe concerning for small ulceration. Does not appear infected at this time. Is likely mechanical ulceration from prior padding being worn. No warmth or induration indicate cellulitis. We'll check ESR, CRP, and CBC to help rule out or rule in infection. She does likely have PAD and has been evaluated by vascular surgery previously. If no signs of infection would plan for follow-up with vascular surgery. Advised not to use the same pad. Placed gauze on top of toe to provide protection. She is given return precautions.  History of hyponatremia Prior history of hyponatremia. Asymptomatic. His fluid restricting. Is followed by nephrology for this. Sees them next week.  Essential hypertension At goal today. Continue metoprolol.  Atrial fibrillation ([facility]) Asymptomatic. Followed by cardiology.    Orders Placed This Encounter  Procedures  . Sed Rate (ESR)  . C-reactive protein  . CBC  .  Basic Metabolic Panel (BMET)    Tommi Rumps, MD [place]

## 2016-05-09 NOTE — Assessment & Plan Note (Signed)
Asymptomatic °Followed by cardiology °

## 2016-05-09 NOTE — Assessment & Plan Note (Signed)
Prior history of hyponatremia. Asymptomatic. His fluid restricting. Is followed by nephrology for this. Sees them next week.

## 2016-05-09 NOTE — Assessment & Plan Note (Addendum)
Area of irritation on dorsal aspect of left second toe concerning for small ulceration. Does not appear infected at this time. Is likely mechanical ulceration from prior padding being worn. No warmth or induration indicate cellulitis. We'll check ESR, CRP, and CBC to help rule out or rule in infection. She does likely have PAD and has been evaluated by vascular surgery previously. If no signs of infection would plan for follow-up with vascular surgery. Advised not to use the same pad. Placed gauze on top of toe to provide protection. She is given return precautions.

## 2016-05-09 NOTE — Assessment & Plan Note (Signed)
At goal today. Continue metoprolol.

## 2016-05-09 NOTE — Progress Notes (Signed)
Pre visit review using our clinic review tool, if applicable. No additional management support is needed unless otherwise documented below in the visit note. 

## 2016-05-09 NOTE — Patient Instructions (Signed)
Nice to see you. Return to obtain some lab work to ensure there is no underlying infection in your toe. I will ask you to keep pressure off of this area. You should monitor this area. If there is drainage or redness or pain please let us know. If you develop nausea, vomiting, fevers, chills, confusion, chest pain, shortness of breath, pain in your toe, drainage from her toe, or any new or changing symptoms please seek medical attention.

## 2016-05-13 ENCOUNTER — Telehealth: Payer: Self-pay | Admitting: *Deleted

## 2016-05-13 NOTE — Telephone Encounter (Signed)
Patient's niece was informed of results.  Patients niece understood and no questions, comments, or concerns at this time.

## 2016-05-13 NOTE — Telephone Encounter (Signed)
Patients niece has requested the lab results from 06/16 [name] [phone]

## 2016-05-16 DIAGNOSIS — E871 Hypo-osmolality and hyponatremia: Secondary | ICD-10-CM | POA: Diagnosis not present

## 2016-06-02 ENCOUNTER — Ambulatory Visit: Payer: PPO | Attending: Pain Medicine | Admitting: Pain Medicine

## 2016-06-02 ENCOUNTER — Encounter: Payer: Self-pay | Admitting: Pain Medicine

## 2016-06-02 VITALS — BP 154/76 | HR 81 | Temp 97.6°F | Resp 16 | Ht 60.0 in | Wt 114.0 lb

## 2016-06-02 DIAGNOSIS — M4120 Other idiopathic scoliosis, site unspecified: Secondary | ICD-10-CM

## 2016-06-02 DIAGNOSIS — M47894 Other spondylosis, thoracic region: Secondary | ICD-10-CM

## 2016-06-02 DIAGNOSIS — M19011 Primary osteoarthritis, right shoulder: Secondary | ICD-10-CM

## 2016-06-02 DIAGNOSIS — M5030 Other cervical disc degeneration, unspecified cervical region: Secondary | ICD-10-CM

## 2016-06-02 DIAGNOSIS — M5412 Radiculopathy, cervical region: Secondary | ICD-10-CM | POA: Diagnosis not present

## 2016-06-02 DIAGNOSIS — M47817 Spondylosis without myelopathy or radiculopathy, lumbosacral region: Secondary | ICD-10-CM | POA: Diagnosis not present

## 2016-06-02 DIAGNOSIS — M4186 Other forms of scoliosis, lumbar region: Secondary | ICD-10-CM | POA: Insufficient documentation

## 2016-06-02 DIAGNOSIS — M170 Bilateral primary osteoarthritis of knee: Secondary | ICD-10-CM

## 2016-06-02 DIAGNOSIS — M47814 Spondylosis without myelopathy or radiculopathy, thoracic region: Secondary | ICD-10-CM

## 2016-06-02 DIAGNOSIS — M5136 Other intervertebral disc degeneration, lumbar region: Secondary | ICD-10-CM | POA: Insufficient documentation

## 2016-06-02 DIAGNOSIS — L578 Other skin changes due to chronic exposure to nonionizing radiation: Secondary | ICD-10-CM | POA: Diagnosis not present

## 2016-06-02 DIAGNOSIS — M5416 Radiculopathy, lumbar region: Secondary | ICD-10-CM | POA: Diagnosis not present

## 2016-06-02 DIAGNOSIS — M47812 Spondylosis without myelopathy or radiculopathy, cervical region: Secondary | ICD-10-CM | POA: Diagnosis not present

## 2016-06-02 DIAGNOSIS — L570 Actinic keratosis: Secondary | ICD-10-CM | POA: Diagnosis not present

## 2016-06-02 DIAGNOSIS — M4184 Other forms of scoliosis, thoracic region: Secondary | ICD-10-CM | POA: Diagnosis not present

## 2016-06-02 DIAGNOSIS — M47816 Spondylosis without myelopathy or radiculopathy, lumbar region: Secondary | ICD-10-CM

## 2016-06-02 DIAGNOSIS — M19012 Primary osteoarthritis, left shoulder: Secondary | ICD-10-CM

## 2016-06-02 DIAGNOSIS — M5134 Other intervertebral disc degeneration, thoracic region: Secondary | ICD-10-CM

## 2016-06-02 DIAGNOSIS — M542 Cervicalgia: Secondary | ICD-10-CM | POA: Diagnosis not present

## 2016-06-02 DIAGNOSIS — M546 Pain in thoracic spine: Secondary | ICD-10-CM | POA: Diagnosis not present

## 2016-06-02 DIAGNOSIS — C44612 Basal cell carcinoma of skin of right upper limb, including shoulder: Secondary | ICD-10-CM | POA: Diagnosis not present

## 2016-06-02 DIAGNOSIS — M51369 Other intervertebral disc degeneration, lumbar region without mention of lumbar back pain or lower extremity pain: Secondary | ICD-10-CM

## 2016-06-02 MED ORDER — HYDROCODONE-ACETAMINOPHEN 7.5-325 MG PO TABS: ORAL_TABLET | ORAL | Status: DC

## 2016-06-02 NOTE — Progress Notes (Signed)
Safety precautions to be maintained throughout the outpatient stay will include: orient to surroundings, keep bed in low position, maintain call bell within reach at all times, provide assistance with transfer out of bed and ambulation.  

## 2016-06-02 NOTE — Progress Notes (Signed)
   Subjective:    Patient ID: Darlene Yoder, female    DOB: 05/22/1922, 82 y.o.   MRN: [id number]  HPI  The patient is a 82 year old female who returns to pain management for further evaluation and treatment of pain involving neck entire back upper and lower extremity region and region of the knees. On today's visit we discussed patient's condition and patient continues to tolerate hydrocodone acetaminophen without any undesirable side effects. The patient was accompanied by her daughter on today's visit. The patient did admit to pain involving the knees and we discussed treatment of the knee. At the present time patient prefers to avoid interventional treatment of the knee. The patient has undergone prior Synvisc injections of the knee. The patient may be a candidate for genicular nerve blocks of the knee. The patient did go to physical therapy for home exercise program and has had some exacerbation of pain involving the neck. We have advised patient to approached exercise program. Slowly and to avoid aggravation of symptoms. We also informed patient that gentle massage may help as patient attempts to progress with the exercise program. At the present time we will continue medications as prescribed and we'll remain available to consider modifications of treatment should they be significant change in patient's condition. All agreed to suggested treatment plan. The patient and her daughter were both please with the level of control of patient's pain at this time.  Review of Systems     Objective:   Physical Exam  There was tenderness of the splenius capitis and occipitalis region palpation which reproduces moderate discomfort.. The patient appeared to be with unremarkable Spurling's maneuver. There was tenderness of the acromioclavicular and glenohumeral joint regions. The patient appeared to be with out significant increase of pain with Tinel and Phalen's maneuver and appeared to be with slightly  decreased grip strength. There was tenderness over the thoracic region with no crepitus of the thoracic region noted. Palpation over the lumbar region lumbar paraspinal musculature region was attends to palpation of moderate degree as well as moderate tenderness of the PSIS and PII S region. There was mild tenderness along the greater trochanteric region iliotibial band region. The right knee was attends to palpation with crepitus of the knee with EHL strength slightly decreased with negative clonus negative Homans. There was no increased warmth and erythema of the extremities noted..  Abdomen nontender with no costovertebral tenderness noted      Assessment & Plan:      Degenerative disc disease cervical spine  Cervical facet syndrome  Degenerative disc disease thoracic spine  Thoracic facet syndrome  Kyphoscoliosis of the thoracic oh lumbar spine  Degenerative disc disease lumbar spine  Lumbar facet syndrome      PLAN    Continue present medication hydrocodone acetaminophen provided without drowsiness confusion excessive sedation and other side effects  F/U PCP Dr.Sonnenberg for evaliation of  BP and general medical  condition  F/U surgical evaluation. May consider pending follow-up evaluations. Patient is without plans for surgical intervention at this time  Exercise with caution to avoid aggravation of symptoms if tolerated  F/U Dr Sarina Ser as discussed  F/U neurological evaluation. May consider pending follow-up evaluations  May consider radiofrequency rhizolysis or intraspinal procedures pending response to present treatment and F/U evaluation . We will avoid such procedures at this time  Patient to call [facility] should patient have concerns prior to scheduled return appointment.

## 2016-06-02 NOTE — Patient Instructions (Addendum)
PLAN    Continue present medication hydrocodone acetaminophen provided without drowsiness confusion excessive sedation and other side effects  F/U PCP Dr.Sonnenberg for evaliation of  BP and general medical  condition  F/U surgical evaluation. May consider pending follow-up evaluations  Exercise with caution to avoid aggravation of symptoms if tolerated  F/U Dr Sarina Ser as discussed  F/U neurological evaluation. May consider pending follow-up evaluations  May consider radiofrequency rhizolysis or intraspinal procedures pending response to present treatment and F/U evaluation . We will avoid such procedures at this time  Patient to call [facility] should patient have concerns prior to scheduled return appointment.

## 2016-06-05 ENCOUNTER — Ambulatory Visit: Payer: PPO | Admitting: Pain Medicine

## 2016-06-05 ENCOUNTER — Ambulatory Visit: Payer: PPO | Admitting: Family Medicine

## 2016-06-06 ENCOUNTER — Ambulatory Visit (INDEPENDENT_AMBULATORY_CARE_PROVIDER_SITE_OTHER): Payer: PPO | Admitting: Family Medicine

## 2016-06-06 ENCOUNTER — Encounter: Payer: Self-pay | Admitting: Family Medicine

## 2016-06-06 VITALS — BP 144/88 | HR 80 | Temp 98.0°F | Ht 60.0 in | Wt 113.2 lb

## 2016-06-06 DIAGNOSIS — E871 Hypo-osmolality and hyponatremia: Secondary | ICD-10-CM

## 2016-06-06 DIAGNOSIS — K219 Gastro-esophageal reflux disease without esophagitis: Secondary | ICD-10-CM

## 2016-06-06 DIAGNOSIS — L97521 Non-pressure chronic ulcer of other part of left foot limited to breakdown of skin: Secondary | ICD-10-CM | POA: Diagnosis not present

## 2016-06-06 NOTE — Assessment & Plan Note (Signed)
Much improved. Minor callus now. They're going to obtain toe spacers to help protect this toe and the great toe. They will continue to monitor. Given return precautions.

## 2016-06-06 NOTE — Assessment & Plan Note (Addendum)
Patient with hyponatremia. Asymptomatic at this time. Has been fluid restricting. Following with nephrology. Sees them in September. We will continue to monitor. If develops symptoms they will follow-up.

## 2016-06-06 NOTE — Assessment & Plan Note (Signed)
Stable. Benign abdominal exam. Continue Protonix once daily.

## 2016-06-06 NOTE — Progress Notes (Signed)
Pre visit review using our clinic review tool, if applicable. No additional management support is needed unless otherwise documented below in the visit note. 

## 2016-06-06 NOTE — Progress Notes (Signed)
Patient ID: Darlene Yoder, female   DOB: July 18, 1922, 82 y.o.   MRN: [id number]  Tommi Rumps, MD Phone: [phone]  Darlene Yoder is a 82 y.o. female who presents today for follow-up.  Left second toe ulceration: This is significantly improved. No ulceration. Mild callus. Has been placing a bandage on it to protect it. Workup last time was not indicative of an inflammatory process. No drainage or fevers.  Hyponatremia: No nausea, vomiting, or confusion. Has been drinking less water and getting her liquids from solid foods. Is following with nephrology for this and has follow-up in September.  GERD: Rarely gets burning sensation if she does not take her Protonix. Is taking this once a day. Had workup with EGD and GI in the past revealing hiatal hernia. No blood in her stool or abdominal pain.  PMH: nonsmoker.   ROS see history of present illness  Objective  Physical Exam Filed Vitals:   06/06/16 0951  BP: 144/88  Pulse: 80  Temp: 98 F (36.7 C)    BP Readings from Last 3 Encounters:  06/06/16 144/88  06/02/16 154/76  05/09/16 112/74   Wt Readings from Last 3 Encounters:  06/06/16 113 lb 4 oz (51.37 kg)  06/02/16 114 lb (51.71 kg)  05/06/16 111 lb (50.349 kg)    Physical Exam  Constitutional: No distress.  Cardiovascular: Normal rate, regular rhythm and normal heart sounds.   Pulmonary/Chest: Effort normal and breath sounds normal.  Abdominal: Soft. She exhibits no distension. There is no tenderness.  Musculoskeletal: She exhibits no edema.  Neurological: She is alert.  Skin: Skin is warm and dry. She is not diaphoretic.  Left second toe with mild callus over the dorsal aspect second toe overrides great toe, no skin changes of the great toe or other toes, good capillary refill in her left foot, right foot with callus over second toe and no other skin changes noted with good capillary refill     Assessment/Plan: Please see individual problem list.  Toe ulcer  ([place]) Much improved. Minor callus now. They're going to obtain toe spacers to help protect this toe and the great toe. They will continue to monitor. Given return precautions.  Hyponatremia Patient with hyponatremia. Asymptomatic at this time. Has been fluid restricting. Following with nephrology. Sees them in September. We will continue to monitor. If develops symptoms they will follow-up.  GERD (gastroesophageal reflux disease) Stable. Benign abdominal exam. Continue Protonix once daily.    Tommi Rumps, MD [place]

## 2016-06-06 NOTE — Patient Instructions (Signed)
Nice to see you. Please continue to monitor your feet. Please continue to monitor your liquid intake and follow-up with the nephrologist as scheduled. If you develop nausea, vomiting, confusion, ulceration of your feet, drainage, redness, fevers, or any new or changing symptoms please seek medical attention.

## 2016-06-28 ENCOUNTER — Other Ambulatory Visit: Payer: Self-pay | Admitting: Family Medicine

## 2016-06-30 ENCOUNTER — Other Ambulatory Visit: Payer: Self-pay | Admitting: Family Medicine

## 2016-06-30 NOTE — Telephone Encounter (Signed)
Can we refill this? Patients niece verified that patient is taking Eliquis 2.5 MG BID

## 2016-06-30 NOTE — Telephone Encounter (Signed)
Sent to pharmacy 

## 2016-07-01 DIAGNOSIS — K148 Other diseases of tongue: Secondary | ICD-10-CM | POA: Diagnosis not present

## 2016-07-02 ENCOUNTER — Telehealth: Payer: Self-pay | Admitting: Family Medicine

## 2016-07-02 NOTE — Telephone Encounter (Signed)
Ally [phone] called from Dr Loyal Gambler office regarding stopping pt ELIQUIS 2.5 MG TABS tablet so a medical clearance form was faxed yesterday. Please write answer on form and fax back to [phone]. Thank you!

## 2016-07-03 ENCOUNTER — Ambulatory Visit: Payer: PPO | Attending: Pain Medicine | Admitting: Pain Medicine

## 2016-07-03 ENCOUNTER — Encounter: Payer: Self-pay | Admitting: Pain Medicine

## 2016-07-03 DIAGNOSIS — M5416 Radiculopathy, lumbar region: Secondary | ICD-10-CM | POA: Diagnosis not present

## 2016-07-03 DIAGNOSIS — M47814 Spondylosis without myelopathy or radiculopathy, thoracic region: Secondary | ICD-10-CM

## 2016-07-03 DIAGNOSIS — M47812 Spondylosis without myelopathy or radiculopathy, cervical region: Secondary | ICD-10-CM | POA: Diagnosis not present

## 2016-07-03 DIAGNOSIS — M5136 Other intervertebral disc degeneration, lumbar region: Secondary | ICD-10-CM | POA: Diagnosis not present

## 2016-07-03 DIAGNOSIS — M533 Sacrococcygeal disorders, not elsewhere classified: Secondary | ICD-10-CM | POA: Insufficient documentation

## 2016-07-03 DIAGNOSIS — M4186 Other forms of scoliosis, lumbar region: Secondary | ICD-10-CM | POA: Diagnosis not present

## 2016-07-03 DIAGNOSIS — M5030 Other cervical disc degeneration, unspecified cervical region: Secondary | ICD-10-CM | POA: Diagnosis not present

## 2016-07-03 DIAGNOSIS — M47816 Spondylosis without myelopathy or radiculopathy, lumbar region: Secondary | ICD-10-CM | POA: Insufficient documentation

## 2016-07-03 DIAGNOSIS — M5412 Radiculopathy, cervical region: Secondary | ICD-10-CM | POA: Diagnosis not present

## 2016-07-03 DIAGNOSIS — M4120 Other idiopathic scoliosis, site unspecified: Secondary | ICD-10-CM

## 2016-07-03 DIAGNOSIS — M4184 Other forms of scoliosis, thoracic region: Secondary | ICD-10-CM | POA: Diagnosis not present

## 2016-07-03 DIAGNOSIS — M19012 Primary osteoarthritis, left shoulder: Secondary | ICD-10-CM

## 2016-07-03 DIAGNOSIS — M47894 Other spondylosis, thoracic region: Secondary | ICD-10-CM

## 2016-07-03 DIAGNOSIS — M19019 Primary osteoarthritis, unspecified shoulder: Secondary | ICD-10-CM | POA: Insufficient documentation

## 2016-07-03 DIAGNOSIS — M5134 Other intervertebral disc degeneration, thoracic region: Secondary | ICD-10-CM

## 2016-07-03 DIAGNOSIS — M546 Pain in thoracic spine: Secondary | ICD-10-CM | POA: Diagnosis not present

## 2016-07-03 DIAGNOSIS — M170 Bilateral primary osteoarthritis of knee: Secondary | ICD-10-CM

## 2016-07-03 DIAGNOSIS — M1710 Unilateral primary osteoarthritis, unspecified knee: Secondary | ICD-10-CM | POA: Insufficient documentation

## 2016-07-03 DIAGNOSIS — M19011 Primary osteoarthritis, right shoulder: Secondary | ICD-10-CM

## 2016-07-03 DIAGNOSIS — M179 Osteoarthritis of knee, unspecified: Secondary | ICD-10-CM | POA: Insufficient documentation

## 2016-07-03 DIAGNOSIS — M51369 Other intervertebral disc degeneration, lumbar region without mention of lumbar back pain or lower extremity pain: Secondary | ICD-10-CM

## 2016-07-03 DIAGNOSIS — M47817 Spondylosis without myelopathy or radiculopathy, lumbosacral region: Secondary | ICD-10-CM | POA: Diagnosis not present

## 2016-07-03 DIAGNOSIS — M545 Low back pain: Secondary | ICD-10-CM | POA: Diagnosis not present

## 2016-07-03 MED ORDER — HYDROCODONE-ACETAMINOPHEN 7.5-325 MG PO TABS: 1.0000 | ORAL_TABLET | Freq: Three times a day (TID) | ORAL | 0 refills | Status: DC | PRN

## 2016-07-03 MED ORDER — HYDROCODONE-ACETAMINOPHEN 7.5-325 MG PO TABS: ORAL_TABLET | ORAL | 0 refills | Status: DC

## 2016-07-03 NOTE — Telephone Encounter (Signed)
Form handed to the provider-aa

## 2016-07-03 NOTE — Progress Notes (Signed)
    The patient is a 82 year old female who returns to pain management for further evaluation and treatment of pain involving the upper mid lower back lower extremity region and region of the knees. The patient continues to be with pain fairly well-controlled at this time of her present regimen of hydrocodone acetaminophen. We have discussed interventional treatment which patient prefers to avoid at this time. We will continue presently prescribed medications. Patient tolerating medications without drowsiness confusion and other side effects. The patient is accompanied by her daughter who agrees the patient is doing very well at this time and is without any undesirable side effects. We will consider modification of treatment regimen including interventional treatment should there be significant change in patient's condition or should pain be without controlled with noninterventional measures. All agreed to suggested treatment plan.      Physical examination  There was tenderness of the splenius capitis and occipitalis region of mild degree palpation over the region of the cervical and thoracic facet regions reproduced pain of moderate degree. There was tenderness of the acromioclavicular and glenohumeral joint region a moderate degree and patient was with limited range of motion of the shoulders. The patient was with decreased grip strength without increased pain with Tinel and Phalen's maneuver palpation of the thoracic region was attends to palpation of moderate degree the lower thoracic region with kyphoscoliosis of the thoracolumbar spine noted.. There was no crepitus of the thoracic region noted. Palpation over the lumbar region was of increased pain with palpation over the lumbar facet region. Palpation of the PSIS and PII S region reproduces moderate discomfort as well straight leg raising was tolerated to approximately 20 without increased pain with dorsiflexion noted. The knees were tenderness to  palpation crepitus of the knees with negative anterior and posterior drawer signs without ballottement of the patella. There was negative clonus negative Homans. Abdomen nontender with no costovertebral tenderness noted     Assessment  Degenerative disc disease cervical spine  Cervical facet syndrome  Degenerative disc disease thoracic spine  Thoracic facet syndrome  Kyphoscoliosis of the thoracic oh lumbar spine  Degenerative disc disease lumbar spine  Lumbar facet syndrome    PLAN    Continue present medication hydrocodone acetaminophen provided without drowsiness confusion excessive sedation and other side effects  F/U PCP Dr.Sonnenberg for evaliation of  BP and general medical  condition  F/U surgical evaluation. May consider pending follow-up evaluations. At the present time patient is without desire to consider surgical evaluation or surgical intervention  Exercise with caution to avoid aggravation of symptoms if tolerated  F/U Dr Sarina Ser  F/U neurological evaluation. May consider pending follow-up evaluations  May consider radiofrequency rhizolysis or intraspinal procedures pending response to present treatment and F/U evaluation . We will avoid such procedures at this time  Patient to call [facility] Management Center should patient have concerns prior to scheduled return appointment.

## 2016-07-03 NOTE — Patient Instructions (Addendum)
PLAN    Continue present medication hydrocodone acetaminophen provided without drowsiness confusion excessive sedation and other side effects  F/U PCP Dr.Sonnenberg for evaliation of  BP and general medical  condition  F/U surgical evaluation. May consider pending follow-up evaluations. At the present time patient is without desire to consider surgical evaluation or surgical intervention  Exercise with caution to avoid aggravation of symptoms if tolerated  F/U Dr Sarina Ser  F/U neurological evaluation. May consider pending follow-up evaluations  May consider radiofrequency rhizolysis or intraspinal procedures pending response to present treatment and F/U evaluation . We will avoid such procedures at this time  Patient to call [facility] Management Center should patient have concerns prior to scheduled return appointment.

## 2016-07-03 NOTE — Telephone Encounter (Signed)
Can you please see if this is in Dr. Ellen Henri mailbox and complete if it is, thanks

## 2016-07-03 NOTE — Progress Notes (Signed)
Safety precautions to be maintained throughout the outpatient stay will include: orient to surroundings, keep bed in low position, maintain call bell within reach at all times, provide assistance with transfer out of bed and ambulation.  

## 2016-07-04 NOTE — Telephone Encounter (Signed)
I have messaged the cardiologist the patient saw earlier this year to get his input and will fill the form out once I hear back.

## 2016-07-07 DIAGNOSIS — I890 Lymphedema, not elsewhere classified: Secondary | ICD-10-CM | POA: Diagnosis not present

## 2016-07-07 DIAGNOSIS — I10 Essential (primary) hypertension: Secondary | ICD-10-CM | POA: Diagnosis not present

## 2016-07-07 DIAGNOSIS — M79609 Pain in unspecified limb: Secondary | ICD-10-CM | POA: Diagnosis not present

## 2016-07-07 DIAGNOSIS — M7989 Other specified soft tissue disorders: Secondary | ICD-10-CM | POA: Diagnosis not present

## 2016-07-07 DIAGNOSIS — E785 Hyperlipidemia, unspecified: Secondary | ICD-10-CM | POA: Diagnosis not present

## 2016-07-07 DIAGNOSIS — I701 Atherosclerosis of renal artery: Secondary | ICD-10-CM | POA: Diagnosis not present

## 2016-07-07 DIAGNOSIS — L97519 Non-pressure chronic ulcer of other part of right foot with unspecified severity: Secondary | ICD-10-CM | POA: Diagnosis not present

## 2016-07-07 DIAGNOSIS — I6523 Occlusion and stenosis of bilateral carotid arteries: Secondary | ICD-10-CM | POA: Diagnosis not present

## 2016-07-07 DIAGNOSIS — I739 Peripheral vascular disease, unspecified: Secondary | ICD-10-CM | POA: Diagnosis not present

## 2016-07-07 DIAGNOSIS — I70213 Atherosclerosis of native arteries of extremities with intermittent claudication, bilateral legs: Secondary | ICD-10-CM | POA: Diagnosis not present

## 2016-07-07 NOTE — Telephone Encounter (Signed)
  Darlene Yoder, Darlene Yoder Female, 82 y.o., 11-19-1922 Weight:  114 lb (51.7 kg) Phone:  [phone] PCP:  Leone Haven, MD FYI General MRN:  [id number] MyChart:  Pending Next Appt:  08/19/2016 RE: Darlene Yoder  Received: 3 days ago  Message Contents  Wellington Hampshire, MD  Leone Haven, MD        [place],  For low bleeding risk procedures, I recommend holding Darlene 24 hours before the procedure with no need to bridge.   MA   Previous Messages    ----- Message -----  From: Leone Haven, MD  Sent: 07/04/2016  8:11 AM  To: Wellington Hampshire, MD  Subject: Darlene Yoder                 Hi Dr Fletcher Anon, I received a request from an oral and maxillofacial surgeon for this patient requesting clearance to hold her Darlene. I believe you saw her earlier this year for her a-fib and wanted to check with you on this. She is having an incisional biopsy of soft tissue of the mandible which should be relatively low risk for bleeding. She is on Darlene 2.5 mg BID for a-fib. I calculated her CHADS-VASC score to be 5. I wanted to check with you on the protocol for holding her Darlene given her risk of stroke with her CHADS-VASC score may outweigh the risk of bleeding with a skin procedure. Would it be reasonable to hold her Darlene for 2-3 days prior and then restart after the procedure without any bridging? Any guidance you can provide would be much appreciated. Tommi Rumps.          Will have patient hold Darlene for 24 hours prior to procedure and then restart after the procedure.

## 2016-07-17 DIAGNOSIS — K1239 Other oral mucositis (ulcerative): Secondary | ICD-10-CM | POA: Diagnosis not present

## 2016-07-21 ENCOUNTER — Other Ambulatory Visit: Payer: Self-pay | Admitting: Family Medicine

## 2016-07-24 DIAGNOSIS — K148 Other diseases of tongue: Secondary | ICD-10-CM | POA: Diagnosis not present

## 2016-08-01 ENCOUNTER — Telehealth: Payer: Self-pay | Admitting: *Deleted

## 2016-08-01 ENCOUNTER — Telehealth: Payer: Self-pay | Admitting: Family Medicine

## 2016-08-01 NOTE — Telephone Encounter (Signed)
Tried to contact Providers office but the clinic closes at noon on fridays. Please advise.

## 2016-08-01 NOTE — Telephone Encounter (Signed)
Patient has about five days left.

## 2016-08-01 NOTE — Telephone Encounter (Signed)
Pt niece called about Dr Primus Bravo will no longer be practicing.Pt appt was cancelled with Dr Primus Bravo that was scheduled in October. Pt has no way to get her medication of HYDROcodone-acetaminophen (NORCO) 7.5-325 MG tablet. [name] was told she would have to wait 2 month before she can get her medication. Please advise?   Call [name] niece @ [phone]. Thank you!

## 2016-08-01 NOTE — Telephone Encounter (Signed)
Patient was last seen [id number] last time medication was ordered was [id number]. Please advise.

## 2016-08-01 NOTE — Telephone Encounter (Signed)
Please attempt to contact Dr Ethel Rana office next week. Please find out from the patient how much medication she has left. Thanks.

## 2016-08-01 NOTE — Telephone Encounter (Signed)
Please contact Dr Ethel Rana office to confirm this. If this is true I will provide a short term refill until she can get in with another pain specialist. Thanks.

## 2016-08-04 ENCOUNTER — Other Ambulatory Visit: Payer: Self-pay | Admitting: Pain Medicine

## 2016-08-04 ENCOUNTER — Telehealth: Payer: Self-pay

## 2016-08-04 MED ORDER — HYDROCODONE-ACETAMINOPHEN 7.5-325 MG PO TABS: ORAL_TABLET | ORAL | 0 refills | Status: DC

## 2016-08-04 NOTE — Telephone Encounter (Signed)
Nurses   Description is ready for patient to pick up  Thank you very much

## 2016-08-04 NOTE — Telephone Encounter (Signed)
Patients daughter called and wanted to let us know that she will come by today and pick up the script. She spoke with Dr. Primus Bravo already.

## 2016-08-05 MED ORDER — HYDROCODONE-ACETAMINOPHEN 7.5-325 MG PO TABS: ORAL_TABLET | ORAL | 0 refills | Status: DC

## 2016-08-05 NOTE — Telephone Encounter (Signed)
Dr. Primus Bravo  Has moved to [place]. He is no longer working at [facility]. Please advise.

## 2016-08-05 NOTE — Telephone Encounter (Signed)
Patient has been made aware. Dr Crip will be sending the RX in.  Patient will no longer need the refill.

## 2016-08-05 NOTE — Telephone Encounter (Signed)
I will refill for this single refill. She needs to establish with a new doctor at the pain clinic here in [place]. Please inform the patient of this.

## 2016-08-05 NOTE — Telephone Encounter (Signed)
Patient was able to get the rx from Dr. Ethel Rana office.  I have shredded it!

## 2016-08-12 DIAGNOSIS — E871 Hypo-osmolality and hyponatremia: Secondary | ICD-10-CM | POA: Diagnosis not present

## 2016-08-19 ENCOUNTER — Ambulatory Visit (INDEPENDENT_AMBULATORY_CARE_PROVIDER_SITE_OTHER): Payer: PPO | Admitting: Cardiovascular Disease

## 2016-08-19 ENCOUNTER — Ambulatory Visit (INDEPENDENT_AMBULATORY_CARE_PROVIDER_SITE_OTHER): Payer: PPO | Admitting: *Deleted

## 2016-08-19 ENCOUNTER — Telehealth: Payer: Self-pay | Admitting: Family Medicine

## 2016-08-19 ENCOUNTER — Encounter: Payer: Self-pay | Admitting: Cardiovascular Disease

## 2016-08-19 VITALS — BP 144/64 | HR 80 | Ht 61.0 in | Wt 114.0 lb

## 2016-08-19 DIAGNOSIS — G894 Chronic pain syndrome: Secondary | ICD-10-CM

## 2016-08-19 DIAGNOSIS — I4891 Unspecified atrial fibrillation: Secondary | ICD-10-CM

## 2016-08-19 DIAGNOSIS — Z950 Presence of cardiac pacemaker: Secondary | ICD-10-CM

## 2016-08-19 LAB — CUP PACEART INCLINIC DEVICE CHECK
Battery Voltage: 2.68 V
Brady Statistic RA Percent Paced: 17 %
Brady Statistic RV Percent Paced: 99 %
Implantable Lead Implant Date: 20100201
Implantable Lead Location: 753859
Lead Channel Impedance Value: 375 Ohm
Lead Channel Setting Pacing Amplitude: 2.5 V
Lead Channel Setting Pacing Amplitude: 2.5 V
Lead Channel Setting Pacing Pulse Width: 0.5 ms
MDC IDC LEAD IMPLANT DT: 20100201
MDC IDC LEAD LOCATION: 753860
MDC IDC MSMT BATTERY REMAINING LONGEVITY: 7.2
MDC IDC MSMT LEADCHNL RA SENSING INTR AMPL: 1.4 mV
MDC IDC MSMT LEADCHNL RV IMPEDANCE VALUE: 600 Ohm
MDC IDC MSMT LEADCHNL RV PACING THRESHOLD AMPLITUDE: 0.75 V
MDC IDC MSMT LEADCHNL RV PACING THRESHOLD PULSEWIDTH: 0.5 ms
MDC IDC PG SERIAL: 2198848
MDC IDC SESS DTM: 20170926172600
MDC IDC SET LEADCHNL RV SENSING SENSITIVITY: 1.5 mV

## 2016-08-19 NOTE — Progress Notes (Signed)
Cardiology Office Note   Date:  08/19/2016   ID:  Darlene Yoder, DOB [birth year], MRN [id number]  PCP:  Tommi Rumps, MD  Cardiologist:   Kathlyn Sacramento, MD   Chief Complaint  Patient presents with  . other    3 month follow up. Overall patient is doing well. Meds reviewed verbally with patient.       History of Present Illness: Darlene Yoder is a 82 y.o. female who presents for A follow-up visit regarding paroxysmal atrial fibrillation and symptomatic bradycardia status post pacemaker placement in 2010. She was hospitalized last year for symptomatic hyponatremia. She is currently following up with nephrology with improvement.  She has been doing well and denies any chest pain, shortness of breath or palpitations. She has mild leg edema.  Past Medical History:  Diagnosis Date  . Arthritis   . Atrial fibrillation ([place])   . Carcinoma of the skin, basal cell   . Chickenpox   . CKD (chronic kidney disease)   . GERD (gastroesophageal reflux disease)   . Hypertension   . Low sodium levels   . Pacemaker   . Urge incontinence   . UTI (lower urinary tract infection)     Past Surgical History:  Procedure Laterality Date  . ABDOMINAL HYSTERECTOMY    . APPENDECTOMY    . BASAL CELL CARCINOMA EXCISION Left    cheek  . broken pelvis    . BUNIONECTOMY Bilateral   . CARDIAC CATHETERIZATION    . CORONARY ANGIOGRAM  2015   [place]   . CORONARY ANGIOGRAM  2015  . HEMORRHOID SURGERY    . PACEMAKER INSERTION  12/25/2008   [employer] pacemaker DRRF 2210 Serial [id number]  . PACEMAKER PLACEMENT  2005  . SKIN SURGERY     removal of a mass on cheek  . SMALL INTESTINE SURGERY  2016  . TONSILLECTOMY       Current Outpatient Prescriptions  Medication Sig Dispense Refill  . Calcium 600-200 MG-UNIT tablet Take 1 tablet by mouth daily.    Marland Kitchen docusate sodium (COLACE) 100 MG capsule Take 100 mg by mouth 2 (two) times daily.    Marland Kitchen ELIQUIS 2.5 MG TABS tablet TAKE ONE TABLET BY MOUTH TWICE DAILY  60 tablet 5  . HYDROcodone-acetaminophen (NORCO) 7.5-325 MG tablet Take 1 tablet by mouth every 8 (eight) hours as needed for moderate pain. 60 tablet 0  . metoprolol succinate (TOPROL-XL) 50 MG 24 hr tablet TAKE 1 TABLET (50 MG TOTAL) BY MOUTH DAILY. TAKE WITH OR IMMEDIATELY FOLLOWING A MEAL. 90 tablet 0  . Multiple Vitamins-Minerals (CENTRUM SILVER PO) Take by mouth.    . oxybutynin (DITROPAN) 5 MG tablet Take 1 tablet (5 mg total) by mouth 2 (two) times daily. 60 tablet 11  . pantoprazole (PROTONIX) 40 MG tablet Take 1 tablet (40 mg total) by mouth daily. 30 tablet 3  . polyethylene glycol powder (GLYCOLAX/MIRALAX) powder TAKE 17 G BY MOUTH 2 (TWO) TIMES DAILY AS NEEDED. 527 g 1  . Probiotic Product (PROBIOTIC-10 PO) Take by mouth daily.    . psyllium (METAMUCIL) 58.6 % packet Take 1 packet by mouth daily. Reported on 05/07/2016    . traZODone (DESYREL) 50 MG tablet TAKE 1 TABLET (50 MG TOTAL) BY MOUTH AT BEDTIME. 90 tablet 1  . HYDROcodone-acetaminophen (NORCO) 7.5-325 MG tablet Limit one half to one tab by mouth per day or 2-3 times per day if tolerated (Patient not taking: Reported on 08/19/2016) 60 tablet 0  . HYDROcodone-acetaminophen (  NORCO) 7.5-325 MG tablet Limit 1 tablet by mouth per day or 2-3 times per day if tolerated 60 tablet 0  . temazepam (RESTORIL) 15 MG capsule Take 15 mg by mouth at bedtime as needed for sleep.     No current facility-administered medications for this visit.     Allergies:   Tetanus toxoids    Social History:  The patient  reports that she has never smoked. She has never used smokeless tobacco. She reports that she does not drink alcohol or use drugs.   Family History:  The patient's family history includes Heart disease in her father; Hypertension in her father; Stroke in her father.    ROS:  Please see the history of present illness.   Otherwise, review of systems are positive for none.   All other systems are reviewed and negative.    PHYSICAL  EXAM: VS:  BP (!) 144/64 (BP Location: Right Arm, Patient Position: Sitting, Cuff Size: Normal)   Pulse 80   Ht 5\' 1"  (1.549 m)   Wt 114 lb (51.7 kg)   BMI 21.54 kg/m  , BMI Body mass index is 21.54 kg/m. GEN: Well nourished, well developed, in no acute distress  HEENT: normal  Neck: no JVD, carotid bruits, or masses Cardiac: RRR; no murmurs, rubs, or gallops,no edema  Respiratory:  clear to auscultation bilaterally, normal work of breathing GI: soft, nontender, nondistended, + BS MS: no deformity or atrophy  Skin: warm and dry, no rash Neuro:  Strength and sensation are intact Psych: euthymic mood, full affect   EKG:  EKG is ordered today. The ekg ordered today demonstrates ventricular paced rhythm with likely underlying atrial fibrillation.   Recent Labs: 05/09/2016: BUN 16; Creatinine, Ser 1.01; Hemoglobin 12.6; Platelets 272.0; Potassium 4.6; Sodium 130    Lipid Panel No results found for: CHOL, TRIG, HDL, CHOLHDL, VLDL, LDLCALC, LDLDIRECT    Wt Readings from Last 3 Encounters:  08/19/16 114 lb (51.7 kg)  07/03/16 114 lb (51.7 kg)  06/06/16 113 lb 4 oz (51.4 kg)       No flowsheet data found.    ASSESSMENT AND PLAN:  1.  Atrial fibrillation: No episodes of tachycardia. Continue treatment with metoprolol. Continue long-term anticoagulation with low dose Eliquis given her age and weight.  2. Status post pacemaker placement: She is due for device check today. Continue to follow-up with Dr. Caryl Comes.  3. Chronic hyponatremia: Followed by nephrology.    Disposition:   FU with me in 1 year  Signed,  Kathlyn Sacramento, MD  08/19/2016 2:04 PM    [place]

## 2016-08-19 NOTE — Progress Notes (Signed)
Pacemaker check in clinic. Normal device function. Thresholds, sensing, impedances consistent with previous measurements. Device programmed to maximize longevity. 1,024 mode switches (83% burden), AF +Eliquis, persistent since 02/2016 per graph. Due to frequent tracking of persistent AF per histograms (presenting AF/Vp @ 126bpm), reprogrammed mode to [place]. No high ventricular rates noted. Device programmed at appropriate safety margins. Estimated longevity 6.4 months. Patient education completed. ROV with [facility] in 2 months (battery check only) and with [facility] in 12/2016.

## 2016-08-19 NOTE — Telephone Encounter (Signed)
Pt's niece dropped off a form for a handicapp card. Paper is up front in colored folder for Dr. Caryl Bis to complete.

## 2016-08-19 NOTE — Patient Instructions (Signed)
Medication Instructions:  Your physician recommends that you continue on your current medications as directed. Please refer to the Current Medication list given to you today.   Labwork: none  Testing/Procedures: none  Follow-Up: Your physician wants you to follow-up in: one year with Dr. Arida.  You will receive a reminder letter in the mail two months in advance. If you don't receive a letter, please call our office to schedule the follow-up appointment.   Any Other Special Instructions Will Be Listed Below (If Applicable).     If you need a refill on your cardiac medications before your next appointment, please call your pharmacy.   

## 2016-08-20 MED ORDER — HYDROCODONE-ACETAMINOPHEN 7.5-325 MG PO TABS: 1.0000 | ORAL_TABLET | Freq: Three times a day (TID) | ORAL | 0 refills | Status: DC | PRN

## 2016-08-20 NOTE — Telephone Encounter (Signed)
Spoke with patients niece and let her know that RX was up front for her to pick up.

## 2016-08-20 NOTE — Telephone Encounter (Signed)
Prescription printed. Referral placed.

## 2016-08-20 NOTE — Telephone Encounter (Signed)
Spoke with patients niece and informed her that the form will be placed up front for her to pick up. She also stated that Dr. Primus Bravo has moved to [facility] and it is very hard for the patient to travel to [facility] for appointments. Spoke with Dr. Caryl Bis and he has agreed to refill the pain medicine until patient can get in with a new pain management provider. Patients niece was very appreciative of this. Can you please place another referral to pain management.

## 2016-08-21 DIAGNOSIS — E871 Hypo-osmolality and hyponatremia: Secondary | ICD-10-CM | POA: Diagnosis not present

## 2016-08-25 ENCOUNTER — Telehealth: Payer: Self-pay | Admitting: Family Medicine

## 2016-08-25 NOTE — Telephone Encounter (Signed)
Pt niece Webb Silversmith called and stated that pt may have a bladder infection. Made appt for 10/4. Would it helpful if they brought in a urine sample with them or just wait till they get in the office.   Call [name] (neice) @ [phone]

## 2016-08-25 NOTE — Telephone Encounter (Signed)
LM for patients niece that we will collect urine sample the day of the appointment. If she has any questions to let us know.

## 2016-08-27 ENCOUNTER — Encounter: Payer: Self-pay | Admitting: Family Medicine

## 2016-08-27 ENCOUNTER — Ambulatory Visit (INDEPENDENT_AMBULATORY_CARE_PROVIDER_SITE_OTHER): Payer: PPO | Admitting: Family Medicine

## 2016-08-27 VITALS — BP 150/72 | HR 70 | Temp 97.8°F | Wt 119.2 lb

## 2016-08-27 DIAGNOSIS — R300 Dysuria: Secondary | ICD-10-CM

## 2016-08-27 DIAGNOSIS — N390 Urinary tract infection, site not specified: Secondary | ICD-10-CM | POA: Insufficient documentation

## 2016-08-27 DIAGNOSIS — N3001 Acute cystitis with hematuria: Secondary | ICD-10-CM | POA: Diagnosis not present

## 2016-08-27 DIAGNOSIS — Z23 Encounter for immunization: Secondary | ICD-10-CM

## 2016-08-27 LAB — POC URINALSYSI DIPSTICK (AUTOMATED)
Bilirubin, UA: NEGATIVE
GLUCOSE UA: NEGATIVE
KETONES UA: NEGATIVE
Nitrite, UA: NEGATIVE
Urobilinogen, UA: 0.2
pH, UA: 5.5

## 2016-08-27 MED ORDER — CEPHALEXIN 500 MG PO CAPS: 500.0000 mg | ORAL_CAPSULE | Freq: Two times a day (BID) | ORAL | 0 refills | Status: DC

## 2016-08-27 NOTE — Patient Instructions (Addendum)
Nice to see you. You have a UTI. We are going to treat you with Keflex for you to take for this. If you develop abdominal pain, fevers, or any new or changing symptoms please seek medical attention.

## 2016-08-27 NOTE — Progress Notes (Signed)
Pre visit review using our clinic review tool, if applicable. No additional management support is needed unless otherwise documented below in the visit note. 

## 2016-08-27 NOTE — Progress Notes (Signed)
  Tommi Rumps, MD Phone: [phone]  Darlene Yoder is a 82 y.o. female who presents today for same-day visit.  Patient notes onset of dysuria, urinary frequency, and urinary urgency 4 days ago. She has a weird sensation in her suprapubic region though no abdominal pain. No vaginal discharge. No vomiting or diarrhea. No fevers. She did bring in a urine sample from home that was collected earlier this morning.   ROS see history of present illness  Objective  Physical Exam Vitals:   08/27/16 1302  BP: (!) 150/72  Pulse: 70  Temp: 97.8 F (36.6 C)    BP Readings from Last 3 Encounters:  08/27/16 (!) 150/72  08/19/16 (!) 144/64  07/03/16 (!) 147/75   Wt Readings from Last 3 Encounters:  08/27/16 119 lb 4 oz (54.1 kg)  08/19/16 114 lb (51.7 kg)  07/03/16 114 lb (51.7 kg)    Physical Exam  Constitutional: No distress.  Cardiovascular: Normal rate, regular rhythm and normal heart sounds.   Pulmonary/Chest: Effort normal and breath sounds normal.  Abdominal: Soft. Bowel sounds are normal. She exhibits no distension. There is tenderness (minimal suprapubic tenderness). There is no rebound and no guarding.  Skin: Skin is warm and dry. She is not diaphoretic.     Assessment/Plan: Please see individual problem list.  UTI (urinary tract infection) History and UA is most consistent with UTI. We'll treat with Keflex. We'll send urine for culture. Given return precautions.   Orders Placed This Encounter  Procedures  . Urine Culture  . Flu vaccine HIGH DOSE PF  . POCT Urinalysis Dipstick (Automated)    Meds ordered this encounter  Medications  . cephALEXin (KEFLEX) 500 MG capsule    Sig: Take 1 capsule (500 mg total) by mouth 2 (two) times daily.    Dispense:  14 capsule    Refill:  0    Tommi Rumps, MD [place]

## 2016-08-27 NOTE — Assessment & Plan Note (Signed)
History and UA is most consistent with UTI. We'll treat with Keflex. We'll send urine for culture. Given return precautions.

## 2016-08-28 ENCOUNTER — Encounter: Payer: PPO | Admitting: Pain Medicine

## 2016-08-28 LAB — URINE CULTURE

## 2016-08-29 ENCOUNTER — Telehealth: Payer: Self-pay

## 2016-08-29 NOTE — Telephone Encounter (Signed)
Spoke to Mrs. Darlene Yoder. Gave lab results. She verbalized understanding.

## 2016-09-09 ENCOUNTER — Encounter: Payer: Self-pay | Admitting: Family Medicine

## 2016-09-09 ENCOUNTER — Ambulatory Visit (INDEPENDENT_AMBULATORY_CARE_PROVIDER_SITE_OTHER): Payer: PPO | Admitting: Family Medicine

## 2016-09-09 VITALS — BP 150/78 | HR 50 | Temp 97.5°F | Wt 120.1 lb

## 2016-09-09 DIAGNOSIS — E871 Hypo-osmolality and hyponatremia: Secondary | ICD-10-CM | POA: Diagnosis not present

## 2016-09-09 DIAGNOSIS — Q105 Congenital stenosis and stricture of lacrimal duct: Secondary | ICD-10-CM | POA: Diagnosis not present

## 2016-09-09 DIAGNOSIS — R682 Dry mouth, unspecified: Secondary | ICD-10-CM

## 2016-09-09 DIAGNOSIS — G894 Chronic pain syndrome: Secondary | ICD-10-CM

## 2016-09-09 LAB — BASIC METABOLIC PANEL
BUN: 15 mg/dL (ref 6–23)
CALCIUM: 9.7 mg/dL (ref 8.4–10.5)
CO2: 28 meq/L (ref 19–32)
CREATININE: 0.98 mg/dL (ref 0.40–1.20)
Chloride: 99 mEq/L (ref 96–112)
GFR: 56.39 mL/min — AB (ref >60.00)
Glucose, Bld: 99 mg/dL (ref 70–99)
Potassium: 4.6 mEq/L (ref 3.5–5.1)
SODIUM: 134 meq/L — AB (ref 135–145)

## 2016-09-09 NOTE — Progress Notes (Signed)
  Tommi Rumps, MD Phone: [phone]  Darlene Yoder is a 82 y.o. female who presents today for follow-up.  Patient notes she recently had a biopsy of her gums through her dentist's office. 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 Notes since then her taste has been off. Notes she has dry mouth and uses Biotene with little benefit.  Notes continued left eye drainage. She saw ophthalmology initially and they open up her tear duct though she reports that they told her that it wasn't all the way open. Notes her eye drains due to this. Notes her nose stays congested as well. No postnasal drip. No vision changes or eye pain.  Chronic pain: Followed by pain management for this. She's going to start going to the pain management office in [place] due to her pain management doctor moving back to [place]. Most of her pain is in her joints. She takes Vicodin twice daily. Notes she was sent to physical therapy and this was not beneficial.  PMH: nonsmoker.   ROS see history of present illness  Objective  Physical Exam Vitals:   09/09/16 1004  BP: (!) 150/78  Pulse: (!) 50  Temp: 97.5 F (36.4 C)    BP Readings from Last 3 Encounters:  09/09/16 (!) 150/78  08/27/16 (!) 150/72  08/19/16 (!) 144/64   Wt Readings from Last 3 Encounters:  09/09/16 120 lb 2 oz (54.5 kg)  08/27/16 119 lb 4 oz (54.1 kg)  08/19/16 114 lb (51.7 kg)    Physical Exam  Constitutional: She is well-developed, well-nourished, and in no distress.  HENT:  Head: Normocephalic and atraumatic.  Mouth/Throat: Oropharynx is clear and moist. No oropharyngeal exudate.  No obvious lower gum abnormalities  Cardiovascular: Normal rate, regular rhythm and normal heart sounds.   Pulmonary/Chest: Effort normal and breath sounds normal.  Musculoskeletal: She exhibits no edema.  Skin: Skin is warm and dry. She is not diaphoretic.     Assessment/Plan: Please see individual  problem list.  Chronic pain syndrome She'll continue to follow with pain management for this. She'll continue Vicodin at this time.  Congenital blocked tear duct Patient with likely blocked tear duct again in her left eye. Also possibly with some allergic rhinitis with congestion and possibly allergic conjunctivitis though only unilateral. We'll trial Flonase. She'll follow-up with her eye doctor as scheduled.  Hyponatremia Recheck sodium.  Dry mouth Suspect this is the cause of the taste abnormality in her mouth. We will request records from her dentist given recent biopsy to determine what the actual findings were. Discussed continuing Biotene. We will look into additional treatments for her dry mouth.   Orders Placed This Encounter  Procedures  . Basic metabolic panel    Tommi Rumps, MD [place]

## 2016-09-09 NOTE — Assessment & Plan Note (Signed)
Patient with likely blocked tear duct again in her left eye. Also possibly with some allergic rhinitis with congestion and possibly allergic conjunctivitis though only unilateral. We'll trial Flonase. She'll follow-up with her eye doctor as scheduled.

## 2016-09-09 NOTE — Assessment & Plan Note (Signed)
Suspect this is the cause of the taste abnormality in her mouth. We will request records from her dentist given recent biopsy to determine what the actual findings were. Discussed continuing Biotene. We will look into additional treatments for her dry mouth.

## 2016-09-09 NOTE — Patient Instructions (Addendum)
Nice to see you. Please try Flonase to see if this helps with her congestion and eye dripiness. You can try Biotene over-the-counter to help with your mouth. We will get some lab work and call you with the results.

## 2016-09-09 NOTE — Assessment & Plan Note (Signed)
She'll continue to follow with pain management for this. She'll continue Vicodin at this time.

## 2016-09-09 NOTE — Assessment & Plan Note (Signed)
Recheck sodium.   

## 2016-09-29 DIAGNOSIS — M47812 Spondylosis without myelopathy or radiculopathy, cervical region: Secondary | ICD-10-CM | POA: Diagnosis not present

## 2016-09-29 DIAGNOSIS — M47817 Spondylosis without myelopathy or radiculopathy, lumbosacral region: Secondary | ICD-10-CM | POA: Diagnosis not present

## 2016-09-29 DIAGNOSIS — M5412 Radiculopathy, cervical region: Secondary | ICD-10-CM | POA: Diagnosis not present

## 2016-09-29 DIAGNOSIS — M5416 Radiculopathy, lumbar region: Secondary | ICD-10-CM | POA: Diagnosis not present

## 2016-10-08 ENCOUNTER — Emergency Department: Payer: PPO

## 2016-10-08 ENCOUNTER — Encounter: Payer: Self-pay | Admitting: Emergency Medicine

## 2016-10-08 ENCOUNTER — Observation Stay
Admission: EM | Admit: 2016-10-08 | Discharge: 2016-10-10 | Disposition: A | Discharge status: Home / Self Care | Payer: PPO | Attending: Internal Medicine | Admitting: Internal Medicine

## 2016-10-08 DIAGNOSIS — I10 Essential (primary) hypertension: Secondary | ICD-10-CM | POA: Diagnosis present

## 2016-10-08 DIAGNOSIS — I700 Atherosclerosis of aorta: Secondary | ICD-10-CM | POA: Diagnosis not present

## 2016-10-08 DIAGNOSIS — K449 Diaphragmatic hernia without obstruction or gangrene: Secondary | ICD-10-CM | POA: Insufficient documentation

## 2016-10-08 DIAGNOSIS — I482 Chronic atrial fibrillation: Secondary | ICD-10-CM | POA: Insufficient documentation

## 2016-10-08 DIAGNOSIS — Z79899 Other long term (current) drug therapy: Secondary | ICD-10-CM | POA: Insufficient documentation

## 2016-10-08 DIAGNOSIS — R0789 Other chest pain: Secondary | ICD-10-CM | POA: Diagnosis not present

## 2016-10-08 DIAGNOSIS — Z79891 Long term (current) use of opiate analgesic: Secondary | ICD-10-CM | POA: Diagnosis not present

## 2016-10-08 DIAGNOSIS — R079 Chest pain, unspecified: Secondary | ICD-10-CM | POA: Diagnosis present

## 2016-10-08 DIAGNOSIS — Z8619 Personal history of other infectious and parasitic diseases: Secondary | ICD-10-CM | POA: Insufficient documentation

## 2016-10-08 DIAGNOSIS — Z7901 Long term (current) use of anticoagulants: Secondary | ICD-10-CM | POA: Insufficient documentation

## 2016-10-08 DIAGNOSIS — Z87440 Personal history of urinary (tract) infections: Secondary | ICD-10-CM | POA: Insufficient documentation

## 2016-10-08 DIAGNOSIS — Z90710 Acquired absence of both cervix and uterus: Secondary | ICD-10-CM | POA: Insufficient documentation

## 2016-10-08 DIAGNOSIS — I509 Heart failure, unspecified: Secondary | ICD-10-CM | POA: Insufficient documentation

## 2016-10-08 DIAGNOSIS — N189 Chronic kidney disease, unspecified: Secondary | ICD-10-CM | POA: Diagnosis not present

## 2016-10-08 DIAGNOSIS — Z9889 Other specified postprocedural states: Secondary | ICD-10-CM | POA: Insufficient documentation

## 2016-10-08 DIAGNOSIS — K219 Gastro-esophageal reflux disease without esophagitis: Secondary | ICD-10-CM | POA: Diagnosis not present

## 2016-10-08 DIAGNOSIS — M1990 Unspecified osteoarthritis, unspecified site: Secondary | ICD-10-CM | POA: Insufficient documentation

## 2016-10-08 DIAGNOSIS — Z950 Presence of cardiac pacemaker: Secondary | ICD-10-CM | POA: Diagnosis not present

## 2016-10-08 DIAGNOSIS — S22000A Wedge compression fracture of unspecified thoracic vertebra, initial encounter for closed fracture: Secondary | ICD-10-CM | POA: Diagnosis not present

## 2016-10-08 DIAGNOSIS — K8020 Calculus of gallbladder without cholecystitis without obstruction: Secondary | ICD-10-CM | POA: Insufficient documentation

## 2016-10-08 DIAGNOSIS — Z85828 Personal history of other malignant neoplasm of skin: Secondary | ICD-10-CM | POA: Insufficient documentation

## 2016-10-08 DIAGNOSIS — R1110 Vomiting, unspecified: Secondary | ICD-10-CM | POA: Diagnosis not present

## 2016-10-08 DIAGNOSIS — Z887 Allergy status to serum and vaccine status: Secondary | ICD-10-CM | POA: Insufficient documentation

## 2016-10-08 DIAGNOSIS — I4891 Unspecified atrial fibrillation: Secondary | ICD-10-CM | POA: Diagnosis present

## 2016-10-08 DIAGNOSIS — Z823 Family history of stroke: Secondary | ICD-10-CM | POA: Diagnosis not present

## 2016-10-08 DIAGNOSIS — R109 Unspecified abdominal pain: Secondary | ICD-10-CM | POA: Diagnosis not present

## 2016-10-08 DIAGNOSIS — I130 Hypertensive heart and chronic kidney disease with heart failure and stage 1 through stage 4 chronic kidney disease, or unspecified chronic kidney disease: Secondary | ICD-10-CM | POA: Diagnosis not present

## 2016-10-08 DIAGNOSIS — Z9049 Acquired absence of other specified parts of digestive tract: Secondary | ICD-10-CM | POA: Diagnosis not present

## 2016-10-08 DIAGNOSIS — Z8249 Family history of ischemic heart disease and other diseases of the circulatory system: Secondary | ICD-10-CM | POA: Diagnosis not present

## 2016-10-08 HISTORY — DX: Heart failure, unspecified: I50.9

## 2016-10-08 LAB — CBC
HEMATOCRIT: 40 % (ref 35.0–47.0)
Hemoglobin: 13.4 g/dL (ref 12.0–16.0)
MCH: 30.9 pg (ref 26.0–34.0)
MCHC: 33.4 g/dL (ref 32.0–36.0)
MCV: 92.3 fL (ref 80.0–100.0)
Platelets: 281 10*3/uL (ref 150–440)
RBC: 4.34 MIL/uL (ref 3.80–5.20)
RDW: 13.7 % (ref 11.5–14.5)
WBC: 10.3 10*3/uL (ref 3.6–11.0)

## 2016-10-08 LAB — COMPREHENSIVE METABOLIC PANEL
ALK PHOS: 55 U/L (ref 38–126)
ALT: 9 U/L — ABNORMAL LOW (ref 14–54)
ANION GAP: 11 (ref 5–15)
AST: 19 U/L (ref 15–41)
Albumin: 3.8 g/dL (ref 3.5–5.0)
BILIRUBIN TOTAL: 0.7 mg/dL (ref 0.3–1.2)
BUN: 16 mg/dL (ref 6–20)
CALCIUM: 9.7 mg/dL (ref 8.9–10.3)
CO2: 23 mmol/L (ref 22–32)
Chloride: 98 mmol/L — ABNORMAL LOW (ref 101–111)
Creatinine, Ser: 1.02 mg/dL — ABNORMAL HIGH (ref 0.44–1.00)
GFR calc non Af Amer: 46 mL/min — ABNORMAL LOW (ref >60)
GFR, EST AFRICAN AMERICAN: 54 mL/min — AB (ref >60)
Glucose, Bld: 110 mg/dL — ABNORMAL HIGH (ref 65–99)
Potassium: 4.2 mmol/L (ref 3.5–5.1)
Sodium: 132 mmol/L — ABNORMAL LOW (ref 135–145)
TOTAL PROTEIN: 6.7 g/dL (ref 6.5–8.1)

## 2016-10-08 LAB — LIPASE, BLOOD: LIPASE: 14 U/L (ref 11–51)

## 2016-10-08 LAB — TROPONIN I: Troponin I: <0.03 ng/mL (ref <0.03)

## 2016-10-08 MED ORDER — ONDANSETRON HCL 4 MG/2ML IJ SOLN
4.0000 mg | Freq: Once | INTRAMUSCULAR | Status: AC
  Administered 2016-10-08: 4 mg via INTRAVENOUS
  Filled 2016-10-08: qty 2

## 2016-10-08 MED ORDER — GI COCKTAIL ~~LOC~~
30.0000 mL | Freq: Once | ORAL | Status: AC
  Administered 2016-10-08: 30 mL via ORAL
  Filled 2016-10-08: qty 30

## 2016-10-08 MED ORDER — IOPAMIDOL (ISOVUE-300) INJECTION 61%
30.0000 mL | Freq: Once | INTRAVENOUS | Status: AC
  Administered 2016-10-08: 30 mL via ORAL

## 2016-10-08 MED ORDER — IOPAMIDOL (ISOVUE-300) INJECTION 61%
60.0000 mL | Freq: Once | INTRAVENOUS | Status: AC | PRN
  Administered 2016-10-08: 60 mL via INTRAVENOUS

## 2016-10-08 MED ORDER — HYDROMORPHONE HCL 1 MG/ML IJ SOLN
0.5000 mg | Freq: Once | INTRAMUSCULAR | Status: AC
  Administered 2016-10-08: 0.5 mg via INTRAVENOUS
  Filled 2016-10-08: qty 1

## 2016-10-08 NOTE — ED Triage Notes (Signed)
Pt presents to ED via EMS from home for chest pain that radiates to back , describes as pressure . Known afib with pacemaker

## 2016-10-08 NOTE — ED Notes (Signed)
With CT 

## 2016-10-08 NOTE — ED Provider Notes (Signed)
Time Seen: Approximately 1641  I have reviewed the triage notes  Chief Complaint: Chest Pain   History of Present Illness: Darlene Yoder is a 82 y.o. female *who presents with a known history of chronic atrial fibrillation with pacemaker. Patient's currently on anticoagulation therapy. She presents today per EMS with a episode of acute onset of substernal chest discomfort radiating to the middle of her back. Patient has no history of coronary artery disease. She does have a history of a large hiatal hernia. Patient of was [place] approximately a year ago. Surgeons in [place] had stated that the hernia due to her other past medical history is, etc. was on operable at this time. Patient did have some nausea brief vomiting episode but no persistent emesis. She's also had some loose stool no melena or hematochezia or hematemesis is been noted. She denies any pain radiating to the arm or jaw area.   Past Medical History:  Diagnosis Date  . Arthritis   . Atrial fibrillation ([place])   . Carcinoma of the skin, basal cell   . CHF (congestive heart failure) ([place])   . Chickenpox   . CKD (chronic kidney disease)   . GERD (gastroesophageal reflux disease)   . Hypertension   . Low sodium levels   . Pacemaker   . Urge incontinence   . UTI (lower urinary tract infection)     Patient Active Problem List   Diagnosis Date Noted  . Dry mouth 09/09/2016  . UTI (urinary tract infection) 08/27/2016  . DDD (degenerative disc disease), lumbar 07/03/2016  . Facet syndrome, lumbar 07/03/2016  . DDD (degenerative disc disease), thoracic 07/03/2016  . Thoracic facet syndrome 07/03/2016  . Scoliosis (and kyphoscoliosis), idiopathic 07/03/2016  . Sacroiliac joint dysfunction 07/03/2016  . DDD (degenerative disc disease), cervical 07/03/2016  . DJD 07/03/2016  . DJD (degenerative joint disease) of knee 07/03/2016  . DJD of shoulder 07/03/2016  . Toe ulcer ([place])  05/09/2016  . Constipation 03/09/2016  . GERD (gastroesophageal reflux disease) 03/09/2016  . Dry skin 03/09/2016  . Hyponatremia 01/08/2016  . Sleeping difficulty 01/08/2016  . Presence of permanent cardiac pacemaker 12/07/2015  . Atrial fibrillation ([place]) 12/05/2015  . Abrasion of second toe, right 12/05/2015  . History of basal cell carcinoma of skin 12/05/2015  . Chronic pain syndrome 12/05/2015  . Congenital blocked tear duct 12/05/2015  . Essential hypertension 12/05/2015  . Urge incontinence 12/05/2015    Past Surgical History:  Procedure Laterality Date  . ABDOMINAL HYSTERECTOMY    . APPENDECTOMY    . BASAL CELL CARCINOMA EXCISION Left    cheek  . broken pelvis    . BUNIONECTOMY Bilateral   . CARDIAC CATHETERIZATION    . CORONARY ANGIOGRAM  2015   [place]   . CORONARY ANGIOGRAM  2015  . HEMORRHOID SURGERY    . PACEMAKER INSERTION  12/25/2008   [employer] pacemaker DRRF 2210 Serial [id number]  . PACEMAKER PLACEMENT  2005  . SKIN SURGERY     removal of a mass on cheek  . SMALL INTESTINE SURGERY  2016  . TONSILLECTOMY      Past Surgical History:  Procedure Laterality Date  . ABDOMINAL HYSTERECTOMY    . APPENDECTOMY    . BASAL CELL CARCINOMA EXCISION Left    cheek  . broken pelvis    . BUNIONECTOMY Bilateral   . CARDIAC CATHETERIZATION    . CORONARY ANGIOGRAM  2015   [place]   .  CORONARY ANGIOGRAM  2015  . HEMORRHOID SURGERY    . PACEMAKER INSERTION  12/25/2008   [employer] pacemaker DRRF 2210 Serial [id number]  . PACEMAKER PLACEMENT  2005  . SKIN SURGERY     removal of a mass on cheek  . SMALL INTESTINE SURGERY  2016  . TONSILLECTOMY      Current Outpatient Rx  . Order #: [id number] Class: Historical Med  . Order #: [id number] Class: Normal  . Order #: [id number] Class: Historical Med  . Order #: [id number] Class: Normal  . Order #: [id number] Class: Print  . Order #: [id number] Class: Print  . Order #: [id number] Class: Print  . Order #: [id number] Class: Normal  . Order #:  [id number] Class: Historical Med  . Order #: [id number] Class: Normal  . Order #: [id number] Class: Normal  . Order #: [id number] Class: Normal  . Order #: [id number] Class: Historical Med  . Order #: [id number] Class: Historical Med  . Order #: [id number] Class: Historical Med  . Order #: [id number] Class: Normal    Allergies:  Tetanus toxoids  Family History: Family History  Problem Relation Age of Onset  . Heart disease Father   . Stroke Father   . Hypertension Father     Social History: Social History  Substance Use Topics  . Smoking status: Never Smoker  . Smokeless tobacco: Never Used  . Alcohol use No     Review of Systems:   10 point review of systems was performed and was otherwise negative:  Constitutional: No fever Eyes: No visual disturbances ENT: No sore throat, ear pain Cardiac: Patient's had persistent substernal chest discomfort. Respiratory: No shortness of breath, wheezing, or stridor Abdomen: No abdominal pain, no vomiting, No diarrhea Endocrine: No weight loss, No night sweats Extremities: No peripheral edema, cyanosis Skin: No rashes, easy bruising Neurologic: No focal weakness, trouble with speech or swollowing Urologic: No dysuria, Hematuria, or urinary frequency   Physical Exam:  ED Triage Vitals  Enc Vitals Group     BP 10/08/16 1700 (!) 157/70     Pulse Rate 10/08/16 1638 70     Resp 10/08/16 1638 13     Temp 10/08/16 1638 98.7 F (37.1 C)     Temp Source 10/08/16 1638 Oral     SpO2 10/08/16 1638 100 %     Weight 10/08/16 1639 124 lb 4.8 oz (56.4 kg)     Height 10/08/16 1639 5' (1.524 m)     Head Circumference --      Peak Flow --      Pain Score 10/08/16 1640 6     Pain Loc --      Pain Edu? --      Excl. in [place]? --     General: Awake , Alert , and Oriented times 3; GCS 15 Very anxious Head: Normal cephalic , atraumatic Eyes: Pupils equal , round, reactive to light Nose/Throat: No nasal drainage, patent upper airway without erythema or  exudate.  Neck: Supple, Full range of motion, No anterior adenopathy or palpable thyroid masses Lungs: Clear to ascultation without wheezes , rhonchi, or rales Heart: Regular rate, regular rhythm without murmurs , gallops , or rubs Abdomen: Soft, non tender without rebound, guarding , or rigidity; bowel sounds positive and symmetric in all 4 quadrants. No organomegaly .        Extremities: 2 plus symmetric pulses. No edema, clubbing or cyanosis Neurologic: normal ambulation, Motor symmetric without deficits, sensory intact Skin: warm, dry, no rashes   Labs:   All laboratory work was reviewed including any  pertinent negatives or positives listed below:  Labs Reviewed  COMPREHENSIVE METABOLIC PANEL - Abnormal; Notable for the following:       Result Value   Sodium 132 (*)    Chloride 98 (*)    Glucose, Bld 110 (*)    Creatinine, Ser 1.02 (*)    ALT 9 (*)    GFR calc non Af Amer 46 (*)    GFR calc Af Amer 54 (*)    All other components within normal limits  CBC  TROPONIN I  LIPASE, BLOOD  Laboratory work was reviewed and showed no clinically significant abnormalities.   EKG:  ED ECG REPORT I, Daymon Larsen, the attending physician, personally viewed and interpreted this ECG.  Date: 10/08/2016 EKG Time:1638 Rate: 70 Rhythm: Ventricularly paced rhythm QRS Axis: normal Intervals: normal ST/T Wave abnormalities: normal Conduction Disturbances: none Narrative Interpretation: unremarkable No obvious acute ischemic changes   Radiology:  "Ct Abdomen Pelvis Wo Contrast  Result Date: 10/08/2016 CLINICAL DATA:  Acute onset of generalized chest pain and pressure, radiating to the back. Atrial fibrillation. Initial encounter. EXAM: CT ABDOMEN AND PELVIS WITHOUT CONTRAST TECHNIQUE: Multidetector CT imaging of the abdomen and pelvis was performed following the standard protocol without IV contrast. COMPARISON:  Abdominal radiograph performed earlier today at 5:27 p.m. FINDINGS: Lower  chest: Trace bilateral pleural effusions noted, left greater than right, with left basilar atelectasis, reflecting the patient's large hiatal hernia. The patient's large hiatal hernia contains most of the stomach, and is filled with contrast and air. Diffuse coronary artery calcifications are seen. Pacemaker leads are partially imaged, with a pacemaker noted at the left chest wall. Hepatobiliary: The liver is unremarkable in appearance. Stones are noted dependently within the gallbladder. The gallbladder is otherwise grossly unremarkable. The common bile duct is dilated to 1.7 cm in diameter, raising concern for distal obstruction. Pancreas: The pancreas is diffusely atrophic and not well characterized. There is no evidence for pancreatitis. Spleen: The spleen is grossly unremarkable in appearance. Adrenals/Urinary Tract: The adrenal glands are grossly unremarkable. Mild bilateral renal atrophy is noted, with prominent vascular calcifications noted at the renal hila bilaterally. There is no evidence of hydronephrosis. No definite renal or ureteral stones are identified, though evaluation for renal stones is limited given vascular calcifications. Minimal nonspecific left-sided perinephric stranding is noted. Stomach/Bowel: The stomach is unremarkable in appearance. The small bowel is within normal limits. The patient is status post appendectomy. Mild diverticulosis is noted along the descending and sigmoid colon, without evidence of diverticulitis. Vascular/Lymphatic: Scattered calcification is seen along the abdominal aorta and its branches. The abdominal aorta is otherwise grossly unremarkable. The inferior vena cava is grossly unremarkable. No retroperitoneal lymphadenopathy is seen. No pelvic sidewall lymphadenopathy is identified. Reproductive: The bladder is largely filled with contrast, and is unremarkable in appearance. The prostate remains normal in size. Other: No additional soft tissue abnormalities are  seen. Musculoskeletal: No acute osseous abnormalities are identified. There are chronic compression deformities involving vertebral bodies T10, L2 and L5, with underlying facet disease. There is mild chronic deformity involving the right superior and inferior pubic rami. The visualized musculature is unremarkable in appearance. IMPRESSION: 1. Cholelithiasis, with dilatation of the common bile duct to 1.7 cm in diameter, raising concern for distal obstruction. Would correlate with LFTs and the patient's symptoms, and consider further evaluation as deemed clinically appropriate. 2. Very large hiatal hernia, containing most of the stomach, filled with contrast and air. 3. Trace bilateral pleural effusions, left greater than right,  with left basilar atelectasis. 4. Mild bilateral renal atrophy noted, with associated chronic vascular calcifications. 5. Mild diverticulosis along the descending and sigmoid colon, without evidence of diverticulitis. 6. Scattered aortic atherosclerosis. 7. Chronic compression deformities of vertebral bodies T10, L2 and L5. Electronically Signed   By: Garald Balding M.D.   On: 10/08/2016 23:29   Dg Chest 2 View  Result Date: 10/08/2016 CLINICAL DATA:  Epigastric pain and vomiting.  Chest heaviness. EXAM: CHEST  2 VIEW COMPARISON:  Abdominal radiographs 10/08/2016 FINDINGS: There is a massive air-fluid collection in the lower chest. Findings are compatible with a large hiatal hernia. Upper lungs are clear. Heart is obscured by the large hiatal hernia. Atherosclerotic calcifications at the aortic arch. No large pleural effusions. There are compression deformities in the lower thoracic spine and within the lumbar spine of unknown age. There is a left dual chamber cardiac pacemaker. IMPRESSION: Massive hiatal hernia. Based on the history of vomiting and epigastric pain, a gastric volvulus cannot be excluded. Spinal compression fractures of unknown age. Electronically Signed   By: Markus Daft  M.D.   On: 10/08/2016 18:01   Ct Chest W Contrast  Result Date: 10/08/2016 CLINICAL DATA:  Chest pain that radiates to her back. EXAM: CT CHEST WITH CONTRAST TECHNIQUE: Multidetector CT imaging of the chest was performed during intravenous contrast administration. CONTRAST:  59mL ISOVUE-300 IOPAMIDOL (ISOVUE-300) INJECTION 61% COMPARISON:  Chest x-ray earlier the same day FINDINGS: Cardiovascular: Heart mildly enlarged with displacement due to large hiatal hernia. There is abdominal aortic atherosclerosis without aneurysm. No evidence for thoracic aortic aneurysm. There is no dissection of the thoracic aorta. Opacification of the pulmonary arteries is suboptimal, but no large central pulmonary embolus is seen in the main pulmonary arteries or lobar pulmonary arteries. Segmental and subsegmental pulmonary arteries not reliably evaluated on this study. Mediastinum/Nodes: Very large hiatal hernia contains nearly the entire stomach which is distended with fluid and gas. No mediastinal lymphadenopathy. There is no hilar lymphadenopathy. There is no axillary lymphadenopathy. Lungs/Pleura: Compressive atelectasis noted in the lower lobes bilaterally. No pulmonary edema. No focal airspace consolidation or pleural effusion. Upper Abdomen: Not well visualized. Musculoskeletal: Compression fracture lower thoracic vertebral body noted, likely nonacute. IMPRESSION: 1. Very large hiatal hernia has been incompletely visualized. The degree of distention with gas and fluid raises the question of outlet obstruction and configuration suggests organoaxial volvulus. Abdomen and pelvis CT to include the lower chest could be used to further evaluate, as clinically warranted. 2. The distended stomach generates mass-effect on the heart. 3. Abdominal aortic atherosclerosis. Electronically Signed   By: Misty Stanley M.D.   On: 10/08/2016 19:07   Dg Abd 2 Views  Result Date: 10/08/2016 CLINICAL DATA:  Epigastric pain and vomiting.  EXAM: ABDOMEN - 2 VIEW COMPARISON:  Chest radiograph 10/08/2016 FINDINGS: There is a large air-fluid level in the lower chest. Findings are suggestive for a large hiatal hernia and possibly a gastric volvulus. No evidence for free intraperitoneal air. Bowel gas pattern in the lower abdomen and pelvis is within normal limits. Evidence for an old fracture involving the right superior pubic ramus. Atherosclerotic calcifications in the abdominal aorta and iliac arteries. Dual-chamber cardiac pacer leads are noted. IMPRESSION: Large air-fluid level in the lower chest compatible with a large hiatal hernia. Based on the history, a gastric volvulus cannot be excluded. This may be better characterized with CT. These results were called by telephone at the time of interpretation on 10/08/2016 at 5:57 pm to Dr. Aaron Edelman  [place] , who verbally acknowledged these results. Electronically Signed   By: Markus Daft M.D.   On: 10/08/2016 17:59  "  I personally reviewed the radiologic studies    ED Course:  Differential includes all life-threatening causes for chest pain. This includes but is not exclusive to acute coronary syndrome, aortic dissection, pulmonary embolism, cardiac tamponade, community-acquired pneumonia, pericarditis, musculoskeletal chest wall pain, etc. Given the patient's current clinical presentation and objective findings I felt her pain was most likely gastrointestinal in its source with a significant hiatal hernia had that does not show any evidence of volvulus at this time. Repeat exam of her abdomen shows mainly left-sided abdominal pain but no signs of acute cholecystitis. The patient had serial troponins which were negative. She does have a history of being on anticoagulation therapy. Repeat exam still shows persistent pain and persistent nausea. The patient's case will be reviewed with the hospitalist team for observation and possible GI evaluation for a hiatal hernia. Clinical Course       Assessment: * Acute chest pain likely secondary to large hiatal hernia   Final Clinical Impression:  Final diagnoses:  Abdominal pain     Plan:  Inpatient management            Daymon Larsen, MD 10/08/16 2352

## 2016-10-08 NOTE — ED Notes (Signed)
Pt's brief was changed. 

## 2016-10-08 NOTE — ED Notes (Signed)
Pt had small episode of emesis that resulted in brown clear liquid.

## 2016-10-09 ENCOUNTER — Observation Stay
Admit: 2016-10-09 | Discharge: 2016-10-09 | Disposition: A | Discharge status: Home / Self Care | Payer: PPO | Attending: Internal Medicine | Admitting: Internal Medicine

## 2016-10-09 ENCOUNTER — Telehealth: Payer: Self-pay

## 2016-10-09 ENCOUNTER — Encounter: Payer: Self-pay | Admitting: *Deleted

## 2016-10-09 DIAGNOSIS — R079 Chest pain, unspecified: Secondary | ICD-10-CM | POA: Diagnosis not present

## 2016-10-09 DIAGNOSIS — K449 Diaphragmatic hernia without obstruction or gangrene: Secondary | ICD-10-CM | POA: Diagnosis not present

## 2016-10-09 DIAGNOSIS — I4891 Unspecified atrial fibrillation: Secondary | ICD-10-CM | POA: Diagnosis not present

## 2016-10-09 DIAGNOSIS — K219 Gastro-esophageal reflux disease without esophagitis: Secondary | ICD-10-CM | POA: Diagnosis not present

## 2016-10-09 DIAGNOSIS — I10 Essential (primary) hypertension: Secondary | ICD-10-CM | POA: Diagnosis not present

## 2016-10-09 DIAGNOSIS — I208 Other forms of angina pectoris: Secondary | ICD-10-CM | POA: Diagnosis not present

## 2016-10-09 LAB — CBC
HCT: 34.5 % — ABNORMAL LOW (ref 35.0–47.0)
HEMOGLOBIN: 11.8 g/dL — AB (ref 12.0–16.0)
MCH: 31.4 pg (ref 26.0–34.0)
MCHC: 34.3 g/dL (ref 32.0–36.0)
MCV: 91.6 fL (ref 80.0–100.0)
Platelets: 284 10*3/uL (ref 150–440)
RBC: 3.77 MIL/uL — ABNORMAL LOW (ref 3.80–5.20)
RDW: 13.7 % (ref 11.5–14.5)
WBC: 8.6 10*3/uL (ref 3.6–11.0)

## 2016-10-09 LAB — TROPONIN I
TROPONIN I: 0.04 ng/mL — AB (ref <0.03)
Troponin I: <0.03 ng/mL (ref <0.03)
Troponin I: 0.03 ng/mL (ref <0.03)

## 2016-10-09 LAB — BASIC METABOLIC PANEL
ANION GAP: 7 (ref 5–15)
BUN: 19 mg/dL (ref 6–20)
CALCIUM: 8.8 mg/dL — AB (ref 8.9–10.3)
CO2: 27 mmol/L (ref 22–32)
Chloride: 95 mmol/L — ABNORMAL LOW (ref 101–111)
Creatinine, Ser: 1.04 mg/dL — ABNORMAL HIGH (ref 0.44–1.00)
GFR calc Af Amer: 52 mL/min — ABNORMAL LOW (ref >60)
GFR calc non Af Amer: 45 mL/min — ABNORMAL LOW (ref >60)
GLUCOSE: 137 mg/dL — AB (ref 65–99)
Potassium: 4.6 mmol/L (ref 3.5–5.1)
Sodium: 129 mmol/L — ABNORMAL LOW (ref 135–145)

## 2016-10-09 MED ORDER — HYDROCODONE-ACETAMINOPHEN 7.5-325 MG PO TABS
0.5000 | ORAL_TABLET | Freq: Four times a day (QID) | ORAL | Status: DC | PRN
  Administered 2016-10-09 (×2): 0.5 via ORAL
  Filled 2016-10-09 (×2): qty 1

## 2016-10-09 MED ORDER — OXYBUTYNIN CHLORIDE 5 MG PO TABS
5.0000 mg | ORAL_TABLET | Freq: Two times a day (BID) | ORAL | Status: DC
  Administered 2016-10-09 – 2016-10-10 (×3): 5 mg via ORAL
  Filled 2016-10-09 (×4): qty 1

## 2016-10-09 MED ORDER — POLYETHYLENE GLYCOL 3350 17 G PO PACK: 17.0000 g | PACK | Freq: Every day | ORAL | Status: DC | PRN

## 2016-10-09 MED ORDER — METOPROLOL SUCCINATE ER 50 MG PO TB24
50.0000 mg | ORAL_TABLET | Freq: Every day | ORAL | Status: DC
  Administered 2016-10-10: 50 mg via ORAL
  Filled 2016-10-09: qty 1

## 2016-10-09 MED ORDER — PANTOPRAZOLE SODIUM 40 MG PO TBEC
40.0000 mg | DELAYED_RELEASE_TABLET | Freq: Every day | ORAL | Status: DC
  Administered 2016-10-09 – 2016-10-10 (×2): 40 mg via ORAL
  Filled 2016-10-09 (×2): qty 1

## 2016-10-09 MED ORDER — ONDANSETRON HCL 4 MG/2ML IJ SOLN
4.0000 mg | Freq: Four times a day (QID) | INTRAMUSCULAR | Status: DC | PRN
  Administered 2016-10-09: 4 mg via INTRAVENOUS
  Filled 2016-10-09: qty 2

## 2016-10-09 MED ORDER — HYDROMORPHONE HCL 1 MG/ML IJ SOLN
0.5000 mg | Freq: Four times a day (QID) | INTRAMUSCULAR | Status: DC | PRN
  Administered 2016-10-09: 0.5 mg via INTRAVENOUS
  Filled 2016-10-09: qty 1

## 2016-10-09 MED ORDER — SODIUM CHLORIDE 0.9% FLUSH
3.0000 mL | Freq: Two times a day (BID) | INTRAVENOUS | Status: DC
  Administered 2016-10-09 – 2016-10-10 (×4): 3 mL via INTRAVENOUS

## 2016-10-09 MED ORDER — APIXABAN 2.5 MG PO TABS
2.5000 mg | ORAL_TABLET | Freq: Two times a day (BID) | ORAL | Status: DC
  Administered 2016-10-09 – 2016-10-10 (×3): 2.5 mg via ORAL
  Filled 2016-10-09 (×3): qty 1

## 2016-10-09 MED ORDER — TRAZODONE HCL 50 MG PO TABS
50.0000 mg | ORAL_TABLET | Freq: Every day | ORAL | Status: DC
  Administered 2016-10-09: 50 mg via ORAL
  Filled 2016-10-09: qty 1

## 2016-10-09 MED ORDER — ACETAMINOPHEN 650 MG RE SUPP: 650.0000 mg | Freq: Four times a day (QID) | RECTAL | Status: DC | PRN

## 2016-10-09 MED ORDER — ONDANSETRON HCL 4 MG PO TABS: 4.0000 mg | ORAL_TABLET | Freq: Four times a day (QID) | ORAL | Status: DC | PRN

## 2016-10-09 MED ORDER — ACETAMINOPHEN 325 MG PO TABS: 650.0000 mg | ORAL_TABLET | Freq: Four times a day (QID) | ORAL | Status: DC | PRN

## 2016-10-09 NOTE — Care Management Obs Status (Signed)
[facility] NOTIFICATION   Patient Details  Name: Darlene Yoder MRN: [id number] Date of Birth: 1922-04-21   Medicare Observation Status Notification Given:  Yes    Katrina Stack, RN 10/09/2016, 11:58 AM

## 2016-10-09 NOTE — Progress Notes (Signed)
[place] [facility] [place] NAME: Darlene Yoder    MR#:  [id number]  DATE OF BIRTH:  22-Dec-1921  SUBJECTIVE:seen at bedside.admitted  Abdominal pain,nausea,vomiting,and chest pain.negative troponin.no chest pain today.  CHIEF COMPLAINT:   Chief Complaint  Patient presents with  . Chest Pain    REVIEW OF SYSTEMS:   ROS CONSTITUTIONAL: No fever, fatigue or weakness.  EYES: No blurred or double vision.  EARS, NOSE, AND THROAT: No tinnitus or ear pain.  RESPIRATORY: No cough, shortness of breath, wheezing or hemoptysis.  CARDIOVASCULAR: No chest pain, orthopnea, edema.  GASTROINTESTINAL: No nausea, vomiting, diarrhea or abdominal pain.  GENITOURINARY: No dysuria, hematuria.  ENDOCRINE: No polyuria, nocturia,  HEMATOLOGY: No anemia, easy bruising or bleeding SKIN: No rash or lesion. MUSCULOSKELETAL: No joint pain or arthritis.   NEUROLOGIC: No tingling, numbness, weakness.  PSYCHIATRY: No anxiety or depression.   DRUG ALLERGIES:   Allergies  Allergen Reactions  . Tetanus Toxoids     VITALS:  Blood pressure (!) 105/58, pulse 70, temperature 98.2 F (36.8 C), temperature source Oral, resp. rate 18, height 5' (1.524 m), weight 53.8 kg (118 lb 8 oz), SpO2 94 %.  PHYSICAL EXAMINATION:  GENERAL:  82 y.o.-year-old patient lying in the bed with no acute distress.  EYES: Pupils equal, round, reactive to light and accommodation. No scleral icterus. Extraocular muscles intact.  HEENT: Head atraumatic, normocephalic. Oropharynx and nasopharynx clear.  NECK:  Supple, no jugular venous distention. No thyroid enlargement, no tenderness.  LUNGS: Normal breath sounds bilaterally, no wheezing, rales,rhonchi or crepitation. No use of accessory muscles of respiration.  CARDIOVASCULAR: S1, S2 normal. No murmurs, rubs, or gallops.  ABDOMEN: Soft, nontender, nondistended. Bowel sounds present. No organomegaly or mass.  EXTREMITIES: No pedal edema,  cyanosis, or clubbing.  NEUROLOGIC: Cranial nerves II through XII are intact. Muscle strength 5/5 in all extremities. Sensation intact. Gait not checked.  PSYCHIATRIC: The patient is alert and oriented x 3.  SKIN: No obvious rash, lesion, or ulcer.    LABORATORY PANEL:   CBC  Recent Labs Lab 10/09/16 0318  WBC 8.6  HGB 11.8*  HCT 34.5*  PLT 284   ------------------------------------------------------------------------------------------------------------------  Chemistries   Recent Labs Lab 10/08/16 1643 10/09/16 0318  NA 132* 129*  K 4.2 4.6  CL 98* 95*  CO2 23 27  GLUCOSE 110* 137*  BUN 16 19  CREATININE 1.02* 1.04*  CALCIUM 9.7 8.8*  AST 19  --   ALT 9*  --   ALKPHOS 55  --   BILITOT 0.7  --    ------------------------------------------------------------------------------------------------------------------  Cardiac Enzymes  Recent Labs Lab 10/09/16 1515  TROPONINI 0.04*   ------------------------------------------------------------------------------------------------------------------  RADIOLOGY:  Ct Abdomen Pelvis Wo Contrast  Result Date: 10/08/2016 CLINICAL DATA:  Acute onset of generalized chest pain and pressure, radiating to the back. Atrial fibrillation. Initial encounter. EXAM: CT ABDOMEN AND PELVIS WITHOUT CONTRAST TECHNIQUE: Multidetector CT imaging of the abdomen and pelvis was performed following the standard protocol without IV contrast. COMPARISON:  Abdominal radiograph performed earlier today at 5:27 p.m. FINDINGS: Lower chest: Trace bilateral pleural effusions noted, left greater than right, with left basilar atelectasis, reflecting the patient's large hiatal hernia. The patient's large hiatal hernia contains most of the stomach, and is filled with contrast and air. Diffuse coronary artery calcifications are seen. Pacemaker leads are partially imaged, with a pacemaker noted at the left chest wall. Hepatobiliary: The liver is unremarkable in  appearance. Stones are noted dependently within  the gallbladder. The gallbladder is otherwise grossly unremarkable. The common bile duct is dilated to 1.7 cm in diameter, raising concern for distal obstruction. Pancreas: The pancreas is diffusely atrophic and not well characterized. There is no evidence for pancreatitis. Spleen: The spleen is grossly unremarkable in appearance. Adrenals/Urinary Tract: The adrenal glands are grossly unremarkable. Mild bilateral renal atrophy is noted, with prominent vascular calcifications noted at the renal hila bilaterally. There is no evidence of hydronephrosis. No definite renal or ureteral stones are identified, though evaluation for renal stones is limited given vascular calcifications. Minimal nonspecific left-sided perinephric stranding is noted. Stomach/Bowel: The stomach is unremarkable in appearance. The small bowel is within normal limits. The patient is status post appendectomy. Mild diverticulosis is noted along the descending and sigmoid colon, without evidence of diverticulitis. Vascular/Lymphatic: Scattered calcification is seen along the abdominal aorta and its branches. The abdominal aorta is otherwise grossly unremarkable. The inferior vena cava is grossly unremarkable. No retroperitoneal lymphadenopathy is seen. No pelvic sidewall lymphadenopathy is identified. Reproductive: The bladder is largely filled with contrast, and is unremarkable in appearance. The prostate remains normal in size. Other: No additional soft tissue abnormalities are seen. Musculoskeletal: No acute osseous abnormalities are identified. There are chronic compression deformities involving vertebral bodies T10, L2 and L5, with underlying facet disease. There is mild chronic deformity involving the right superior and inferior pubic rami. The visualized musculature is unremarkable in appearance. IMPRESSION: 1. Cholelithiasis, with dilatation of the common bile duct to 1.7 cm in diameter, raising  concern for distal obstruction. Would correlate with LFTs and the patient's symptoms, and consider further evaluation as deemed clinically appropriate. 2. Very large hiatal hernia, containing most of the stomach, filled with contrast and air. 3. Trace bilateral pleural effusions, left greater than right, with left basilar atelectasis. 4. Mild bilateral renal atrophy noted, with associated chronic vascular calcifications. 5. Mild diverticulosis along the descending and sigmoid colon, without evidence of diverticulitis. 6. Scattered aortic atherosclerosis. 7. Chronic compression deformities of vertebral bodies T10, L2 and L5. Electronically Signed   By: Garald Balding M.D.   On: 10/08/2016 23:29   Dg Chest 2 View  Result Date: 10/08/2016 CLINICAL DATA:  Epigastric pain and vomiting.  Chest heaviness. EXAM: CHEST  2 VIEW COMPARISON:  Abdominal radiographs 10/08/2016 FINDINGS: There is a massive air-fluid collection in the lower chest. Findings are compatible with a large hiatal hernia. Upper lungs are clear. Heart is obscured by the large hiatal hernia. Atherosclerotic calcifications at the aortic arch. No large pleural effusions. There are compression deformities in the lower thoracic spine and within the lumbar spine of unknown age. There is a left dual chamber cardiac pacemaker. IMPRESSION: Massive hiatal hernia. Based on the history of vomiting and epigastric pain, a gastric volvulus cannot be excluded. Spinal compression fractures of unknown age. Electronically Signed   By: Markus Daft M.D.   On: 10/08/2016 18:01   Ct Chest W Contrast  Result Date: 10/08/2016 CLINICAL DATA:  Chest pain that radiates to her back. EXAM: CT CHEST WITH CONTRAST TECHNIQUE: Multidetector CT imaging of the chest was performed during intravenous contrast administration. CONTRAST:  98mL ISOVUE-300 IOPAMIDOL (ISOVUE-300) INJECTION 61% COMPARISON:  Chest x-ray earlier the same day FINDINGS: Cardiovascular: Heart mildly enlarged with  displacement due to large hiatal hernia. There is abdominal aortic atherosclerosis without aneurysm. No evidence for thoracic aortic aneurysm. There is no dissection of the thoracic aorta. Opacification of the pulmonary arteries is suboptimal, but no large central pulmonary embolus is seen  in the main pulmonary arteries or lobar pulmonary arteries. Segmental and subsegmental pulmonary arteries not reliably evaluated on this study. Mediastinum/Nodes: Very large hiatal hernia contains nearly the entire stomach which is distended with fluid and gas. No mediastinal lymphadenopathy. There is no hilar lymphadenopathy. There is no axillary lymphadenopathy. Lungs/Pleura: Compressive atelectasis noted in the lower lobes bilaterally. No pulmonary edema. No focal airspace consolidation or pleural effusion. Upper Abdomen: Not well visualized. Musculoskeletal: Compression fracture lower thoracic vertebral body noted, likely nonacute. IMPRESSION: 1. Very large hiatal hernia has been incompletely visualized. The degree of distention with gas and fluid raises the question of outlet obstruction and configuration suggests organoaxial volvulus. Abdomen and pelvis CT to include the lower chest could be used to further evaluate, as clinically warranted. 2. The distended stomach generates mass-effect on the heart. 3. Abdominal aortic atherosclerosis. Electronically Signed   By: Misty Stanley M.D.   On: 10/08/2016 19:07   Dg Abd 2 Views  Result Date: 10/08/2016 CLINICAL DATA:  Epigastric pain and vomiting. EXAM: ABDOMEN - 2 VIEW COMPARISON:  Chest radiograph 10/08/2016 FINDINGS: There is a large air-fluid level in the lower chest. Findings are suggestive for a large hiatal hernia and possibly a gastric volvulus. No evidence for free intraperitoneal air. Bowel gas pattern in the lower abdomen and pelvis is within normal limits. Evidence for an old fracture involving the right superior pubic ramus. Atherosclerotic calcifications in the  abdominal aorta and iliac arteries. Dual-chamber cardiac pacer leads are noted. IMPRESSION: Large air-fluid level in the lower chest compatible with a large hiatal hernia. Based on the history, a gastric volvulus cannot be excluded. This may be better characterized with CT. These results were called by telephone at the time of interpretation on 10/08/2016 at 5:57 pm to Dr. Meade Maw , who verbally acknowledged these results. Electronically Signed   By: Markus Daft M.D.   On: 10/08/2016 17:59    EKG:   Orders placed or performed in visit on 08/19/16  . EKG 12-Lead    ASSESSMENT AND PLAN:  1.chest pain due to GERD;due to large hiatal hernia.;negative troponin's.Echo done.witing for results. Seen by surgery for hiatal hernia ,spoke with Dr.woodham.start diet,continue nausea meds ,pain meds and PPI.started diet ,if tolerates discharge home am 2.Large hiatal hernia;seen by surgery,may be  Better served at [facility],send referral to [facility]. 3.chronic afib;rate controlled;continue Eliquis,metoprolol, D/w Niece likely discharge home.in Am,    All the records are reviewed and case discussed with Care Management/Social Workerr. Management plans discussed with the patient, family and they are in agreement.  CODE STATUS:full  TOTAL TIME TAKING CARE OF THIS PATIENT: 35 minutes.   POSSIBLE D/C IN 1-2 DAYS, DEPENDING ON CLINICAL CONDITION.   Epifanio Lesches M.D on 10/09/2016 at 5:51 PM  Between 7am to 6pm - Pager - [phone]  After 6pm go to [email or web address] - password [email or web address] [facility]  Office  [phone]  CC: Primary care physician; Tommi Rumps, MD   Note: This dictation was prepared with Dragon dictation along with smaller phrase technology. Any transcriptional errors that result from this process are unintentional.

## 2016-10-09 NOTE — ED Notes (Signed)
Admitting MD at bedside.

## 2016-10-09 NOTE — Care Management (Signed)
Patient placed in observation for chest pain.  Troponins negative thus far.  Surgery consult is pending.  It is thought that the chest pain is due to large hiatial hernia.   Daughter says that patient may have to be seen at [facility] and is concerned whether [facility] is in network with [employer]. Contacted agency and had to leave a voicemail message asking for return call

## 2016-10-09 NOTE — ED Notes (Signed)
Caffie Damme, niece [phone] (home) [phone] (cell)

## 2016-10-09 NOTE — Progress Notes (Signed)
  Echocardiogram 2D Echocardiogram has been performed.  Jennette Dubin 10/09/2016, 3:41 PM

## 2016-10-09 NOTE — Progress Notes (Signed)
Pt requesting something for sleep. Pt takes Trazadone 50mg  daily at bedtime, would like that. MD paged, Dr. Marcille Blanco to put orders in. No further complaints, will continue to monitor. Conley Simmonds, RN, BSN

## 2016-10-09 NOTE — Telephone Encounter (Signed)
Patient has been seen by Dr. Adonis Huguenin in hospital and has large hiatal hernia that is symptomatic. Surgeon feels that she would be best served Diplomatic Services operational officer at [facility] that specializes in this type of surgery as patient has comorbidities. The H&P from admission can be used for referral note.  Please send referral to [facility] at this time.

## 2016-10-09 NOTE — Progress Notes (Signed)
Arrival Method: via stretcher with ED RN, Kendal & pt's niece Mental Orientation: A&O Telemetry: [id number], verified by Sabra Heck, RN & Thayer Headings, NT Skin: Intact, verified by Corine Shelter, RN IV: 18g left forearm Pain: complaining of abdominal pain radiating up to chest, neck & back. Rating it at a 7 out of 10. Will give PRN pain medication. Tubes: none Safety Measures: Safety Fall Prevention Plan has been given, discussed & signed, non skid socks in place, bed alarm activated. 2A Orientation: Patient has been orientated to the room, unit & staff.  Family: Has been informed of plan of care.  Orders have been reviewed & implemented. Will continue to monitor the patient. Call light has been placed within reach.  Georgian Co, RN

## 2016-10-09 NOTE — H&P (Signed)
[facility] [place] NAME: Darlene Yoder    MR#:  [id number]  DATE OF BIRTH:  June 24, 1922  DATE OF ADMISSION:  10/08/2016  PRIMARY CARE PHYSICIAN: Tommi Rumps, MD   REQUESTING/REFERRING PHYSICIAN: Marcelene Butte, MD  CHIEF COMPLAINT:   Chief Complaint  Patient presents with  . Chest Pain    HISTORY OF PRESENT ILLNESS:  Darlene Yoder  is a 82 y.o. female who presents with Chest pain. Patient has a known history of a significant hiatal hernia, and this was seen again on imaging in the ED today. Her cardiac workup so far in the ED is negative. She required significant pain medicine and antiemetics in the ED. Hospitals were called for observation due to her persistent pain and nausea.  PAST MEDICAL HISTORY:   Past Medical History:  Diagnosis Date  . Arthritis   . Atrial fibrillation ([place])   . Carcinoma of the skin, basal cell   . CHF (congestive heart failure) ([place])   . Chickenpox   . CKD (chronic kidney disease)   . GERD (gastroesophageal reflux disease)   . Hypertension   . Low sodium levels   . Pacemaker   . Urge incontinence   . UTI (lower urinary tract infection)     PAST SURGICAL HISTORY:   Past Surgical History:  Procedure Laterality Date  . ABDOMINAL HYSTERECTOMY    . APPENDECTOMY    . BASAL CELL CARCINOMA EXCISION Left    cheek  . broken pelvis    . BUNIONECTOMY Bilateral   . CARDIAC CATHETERIZATION    . CORONARY ANGIOGRAM  2015   [place]   . CORONARY ANGIOGRAM  2015  . HEMORRHOID SURGERY    . PACEMAKER INSERTION  12/25/2008   [employer] pacemaker DRRF 2210 Serial [id number]  . PACEMAKER PLACEMENT  2005  . SKIN SURGERY     removal of a mass on cheek  . SMALL INTESTINE SURGERY  2016  . TONSILLECTOMY      SOCIAL HISTORY:   Social History  Substance Use Topics  . Smoking status: Never Smoker  . Smokeless tobacco: Never Used  . Alcohol use No    FAMILY HISTORY:   Family History  Problem Relation Age of  Onset  . Heart disease Father   . Stroke Father   . Hypertension Father     DRUG ALLERGIES:   Allergies  Allergen Reactions  . Tetanus Toxoids     MEDICATIONS AT HOME:   Prior to Admission medications   Medication Sig Start Date End Date Taking? Authorizing Provider  Calcium 600-200 MG-UNIT tablet Take 1 tablet by mouth daily.   Yes Historical Provider, MD  ELIQUIS 2.5 MG TABS tablet TAKE ONE TABLET BY MOUTH TWICE DAILY 06/30/16  Yes Leone Haven, MD  HYDROcodone-acetaminophen [facility]) 7.5-325 MG tablet Limit one half to one tab by mouth per day or 2-3 times per day if tolerated 08/04/16  Yes Mohammed Kindle, MD  metoprolol succinate (TOPROL-XL) 50 MG 24 hr tablet TAKE 1 TABLET (50 MG TOTAL) BY MOUTH DAILY. TAKE WITH OR IMMEDIATELY FOLLOWING A MEAL. 05/01/16  Yes Leone Haven, MD  Multiple Vitamins-Minerals (CENTRUM SILVER PO) Take by mouth.   Yes Historical Provider, MD  oxybutynin (DITROPAN) 5 MG tablet Take 1 tablet (5 mg total) by mouth 2 (two) times daily. 01/08/16  Yes Leone Haven, MD  polyethylene glycol powder (GLYCOLAX/MIRALAX) powder TAKE 17 G BY MOUTH 2 (TWO) TIMES DAILY AS NEEDED. 07/21/16  Yes Leone Haven, MD  traZODone (DESYREL) 50 MG tablet TAKE 1 TABLET (50 MG TOTAL) BY MOUTH AT BEDTIME. 06/30/16  Yes Leone Haven, MD    REVIEW OF SYSTEMS:  Review of Systems  Constitutional: Negative for chills, fever, malaise/fatigue and weight loss.  HENT: Negative for ear pain, hearing loss and tinnitus.   Eyes: Negative for blurred vision, double vision, pain and redness.  Respiratory: Negative for cough, hemoptysis and shortness of breath.   Cardiovascular: Positive for chest pain. Negative for palpitations, orthopnea and leg swelling.  Gastrointestinal: Positive for nausea and vomiting. Negative for abdominal pain, constipation and diarrhea.  Genitourinary: Negative for dysuria, frequency and hematuria.  Musculoskeletal: Negative for back pain, joint pain and  neck pain.  Skin:       No acne, rash, or lesions  Neurological: Negative for dizziness, tremors, focal weakness and weakness.  Endo/Heme/Allergies: Negative for polydipsia. Does not bruise/bleed easily.  Psychiatric/Behavioral: Negative for depression. The patient is not nervous/anxious and does not have insomnia.      VITAL SIGNS:   Vitals:   10/08/16 1830 10/08/16 1900 10/08/16 1930 10/08/16 2300  BP: (!) 148/68 (!) 143/64 (!) 145/71 (!) 149/73  Pulse: 70 70 70 70  Resp: 16 17 13 18   Temp:      TempSrc:      SpO2: 98% 99% 97% 96%  Weight:      Height:       Wt Readings from Last 3 Encounters:  10/08/16 56.4 kg (124 lb 4.8 oz)  09/09/16 54.5 kg (120 lb 2 oz)  08/27/16 54.1 kg (119 lb 4 oz)    PHYSICAL EXAMINATION:  Physical Exam  Vitals reviewed. Constitutional: She is oriented to person, place, and time. She appears well-developed and well-nourished. No distress.  HENT:  Head: Normocephalic and atraumatic.  Mouth/Throat: Oropharynx is clear and moist.  Eyes: Conjunctivae and EOM are normal. Pupils are equal, round, and reactive to light. No scleral icterus.  Neck: Normal range of motion. Neck supple. No JVD present. No thyromegaly present.  Cardiovascular: Normal rate, regular rhythm and intact distal pulses.  Exam reveals no gallop and no friction rub.   No murmur heard. Respiratory: Effort normal and breath sounds normal. No respiratory distress. She has no wheezes. She has no rales.  GI: Soft. Bowel sounds are normal. She exhibits no distension. There is tenderness (epigastric).  Musculoskeletal: Normal range of motion. She exhibits no edema.  No arthritis, no gout  Lymphadenopathy:    She has no cervical adenopathy.  Neurological: She is alert and oriented to person, place, and time. No cranial nerve deficit.  No dysarthria, no aphasia  Skin: Skin is warm and dry. No rash noted. No erythema.  Psychiatric: She has a normal mood and affect. Her behavior is normal.  Judgment and thought content normal.    LABORATORY PANEL:   CBC  Recent Labs Lab 10/08/16 1643  WBC 10.3  HGB 13.4  HCT 40.0  PLT 281   ------------------------------------------------------------------------------------------------------------------  Chemistries   Recent Labs Lab 10/08/16 1643  NA 132*  K 4.2  CL 98*  CO2 23  GLUCOSE 110*  BUN 16  CREATININE 1.02*  CALCIUM 9.7  AST 19  ALT 9*  ALKPHOS 55  BILITOT 0.7   ------------------------------------------------------------------------------------------------------------------  Cardiac Enzymes  Recent Labs Lab 10/08/16 1911  TROPONINI <0.03   ------------------------------------------------------------------------------------------------------------------  RADIOLOGY:  Ct Abdomen Pelvis Wo Contrast  Result Date: 10/08/2016 CLINICAL DATA:  Acute onset of generalized chest pain and  pressure, radiating to the back. Atrial fibrillation. Initial encounter. EXAM: CT ABDOMEN AND PELVIS WITHOUT CONTRAST TECHNIQUE: Multidetector CT imaging of the abdomen and pelvis was performed following the standard protocol without IV contrast. COMPARISON:  Abdominal radiograph performed earlier today at 5:27 p.m. FINDINGS: Lower chest: Trace bilateral pleural effusions noted, left greater than right, with left basilar atelectasis, reflecting the patient's large hiatal hernia. The patient's large hiatal hernia contains most of the stomach, and is filled with contrast and air. Diffuse coronary artery calcifications are seen. Pacemaker leads are partially imaged, with a pacemaker noted at the left chest wall. Hepatobiliary: The liver is unremarkable in appearance. Stones are noted dependently within the gallbladder. The gallbladder is otherwise grossly unremarkable. The common bile duct is dilated to 1.7 cm in diameter, raising concern for distal obstruction. Pancreas: The pancreas is diffusely atrophic and not well characterized. There  is no evidence for pancreatitis. Spleen: The spleen is grossly unremarkable in appearance. Adrenals/Urinary Tract: The adrenal glands are grossly unremarkable. Mild bilateral renal atrophy is noted, with prominent vascular calcifications noted at the renal hila bilaterally. There is no evidence of hydronephrosis. No definite renal or ureteral stones are identified, though evaluation for renal stones is limited given vascular calcifications. Minimal nonspecific left-sided perinephric stranding is noted. Stomach/Bowel: The stomach is unremarkable in appearance. The small bowel is within normal limits. The patient is status post appendectomy. Mild diverticulosis is noted along the descending and sigmoid colon, without evidence of diverticulitis. Vascular/Lymphatic: Scattered calcification is seen along the abdominal aorta and its branches. The abdominal aorta is otherwise grossly unremarkable. The inferior vena cava is grossly unremarkable. No retroperitoneal lymphadenopathy is seen. No pelvic sidewall lymphadenopathy is identified. Reproductive: The bladder is largely filled with contrast, and is unremarkable in appearance. The prostate remains normal in size. Other: No additional soft tissue abnormalities are seen. Musculoskeletal: No acute osseous abnormalities are identified. There are chronic compression deformities involving vertebral bodies T10, L2 and L5, with underlying facet disease. There is mild chronic deformity involving the right superior and inferior pubic rami. The visualized musculature is unremarkable in appearance. IMPRESSION: 1. Cholelithiasis, with dilatation of the common bile duct to 1.7 cm in diameter, raising concern for distal obstruction. Would correlate with LFTs and the patient's symptoms, and consider further evaluation as deemed clinically appropriate. 2. Very large hiatal hernia, containing most of the stomach, filled with contrast and air. 3. Trace bilateral pleural effusions, left  greater than right, with left basilar atelectasis. 4. Mild bilateral renal atrophy noted, with associated chronic vascular calcifications. 5. Mild diverticulosis along the descending and sigmoid colon, without evidence of diverticulitis. 6. Scattered aortic atherosclerosis. 7. Chronic compression deformities of vertebral bodies T10, L2 and L5. Electronically Signed   By: Garald Balding M.D.   On: 10/08/2016 23:29   Dg Chest 2 View  Result Date: 10/08/2016 CLINICAL DATA:  Epigastric pain and vomiting.  Chest heaviness. EXAM: CHEST  2 VIEW COMPARISON:  Abdominal radiographs 10/08/2016 FINDINGS: There is a massive air-fluid collection in the lower chest. Findings are compatible with a large hiatal hernia. Upper lungs are clear. Heart is obscured by the large hiatal hernia. Atherosclerotic calcifications at the aortic arch. No large pleural effusions. There are compression deformities in the lower thoracic spine and within the lumbar spine of unknown age. There is a left dual chamber cardiac pacemaker. IMPRESSION: Massive hiatal hernia. Based on the history of vomiting and epigastric pain, a gastric volvulus cannot be excluded. Spinal compression fractures of unknown age. Electronically  Signed   By: Markus Daft M.D.   On: 10/08/2016 18:01   Ct Chest W Contrast  Result Date: 10/08/2016 CLINICAL DATA:  Chest pain that radiates to her back. EXAM: CT CHEST WITH CONTRAST TECHNIQUE: Multidetector CT imaging of the chest was performed during intravenous contrast administration. CONTRAST:  80mL ISOVUE-300 IOPAMIDOL (ISOVUE-300) INJECTION 61% COMPARISON:  Chest x-ray earlier the same day FINDINGS: Cardiovascular: Heart mildly enlarged with displacement due to large hiatal hernia. There is abdominal aortic atherosclerosis without aneurysm. No evidence for thoracic aortic aneurysm. There is no dissection of the thoracic aorta. Opacification of the pulmonary arteries is suboptimal, but no large central pulmonary embolus is  seen in the main pulmonary arteries or lobar pulmonary arteries. Segmental and subsegmental pulmonary arteries not reliably evaluated on this study. Mediastinum/Nodes: Very large hiatal hernia contains nearly the entire stomach which is distended with fluid and gas. No mediastinal lymphadenopathy. There is no hilar lymphadenopathy. There is no axillary lymphadenopathy. Lungs/Pleura: Compressive atelectasis noted in the lower lobes bilaterally. No pulmonary edema. No focal airspace consolidation or pleural effusion. Upper Abdomen: Not well visualized. Musculoskeletal: Compression fracture lower thoracic vertebral body noted, likely nonacute. IMPRESSION: 1. Very large hiatal hernia has been incompletely visualized. The degree of distention with gas and fluid raises the question of outlet obstruction and configuration suggests organoaxial volvulus. Abdomen and pelvis CT to include the lower chest could be used to further evaluate, as clinically warranted. 2. The distended stomach generates mass-effect on the heart. 3. Abdominal aortic atherosclerosis. Electronically Signed   By: Misty Stanley M.D.   On: 10/08/2016 19:07   Dg Abd 2 Views  Result Date: 10/08/2016 CLINICAL DATA:  Epigastric pain and vomiting. EXAM: ABDOMEN - 2 VIEW COMPARISON:  Chest radiograph 10/08/2016 FINDINGS: There is a large air-fluid level in the lower chest. Findings are suggestive for a large hiatal hernia and possibly a gastric volvulus. No evidence for free intraperitoneal air. Bowel gas pattern in the lower abdomen and pelvis is within normal limits. Evidence for an old fracture involving the right superior pubic ramus. Atherosclerotic calcifications in the abdominal aorta and iliac arteries. Dual-chamber cardiac pacer leads are noted. IMPRESSION: Large air-fluid level in the lower chest compatible with a large hiatal hernia. Based on the history, a gastric volvulus cannot be excluded. This may be better characterized with CT. These  results were called by telephone at the time of interpretation on 10/08/2016 at 5:57 pm to Dr. Meade Maw , who verbally acknowledged these results. Electronically Signed   By: Markus Daft M.D.   On: 10/08/2016 17:59    EKG:   Orders placed or performed during the hospital encounter of 10/08/16  . ED EKG within 10 minutes  . ED EKG within 10 minutes    IMPRESSION AND PLAN:  Principal Problem:   Chest pain - less likely cardiac in origin, however we will finish trending her troponins tonight and get an echocardiogram in the morning. She also has a reported history of CHF on her chart with no recent workup, so echocardiogram once her to evaluate the same. However, I do suspect her chest pain is due to her hiatal hernia, see below for treatment. Active Problems:   Hiatal hernia - likely cause of her chest pain as it is very large on imaging here. Surgical consult for further recommendations   Atrial fibrillation ([place]) - continue home meds including anticoagulation   Essential hypertension - continue home meds   GERD (gastroesophageal reflux disease) - PPI  All the records are reviewed and case discussed with ED provider. Management plans discussed with the patient and/or family.  DVT PROPHYLAXIS: Systemic anticoagulation  GI PROPHYLAXIS: PPI  ADMISSION STATUS: Observation  CODE STATUS: Full Code Status History    This patient does not have a recorded code status. Please follow your organizational policy for patients in this situation.      TOTAL TIME TAKING CARE OF THIS PATIENT: 40 minutes.    Kenderick Kobler FIELDING 10/09/2016, 12:55 AM  [facility]  Office  [phone]  CC: Primary care physician; Tommi Rumps, MD

## 2016-10-09 NOTE — Consult Note (Signed)
Patient ID: Darlene Yoder, female   DOB: 01-21-1922, 82 y.o.   MRN: [id number]  CC: Chest pain  HPI Darlene Yoder is a 82 y.o. female who is currently admitted to the medicine service for workup of chest pain. Surgery consultation is requested by Dr. Jannifer Franklin for evaluation of a large hiatal hernia. Upon conversation with the patient she is known she's had a hiatal hernia for at least the last 4 years. Per her niece she is becoming progressively more symptomatic more frequently for this hiatal hernia. Patient states she is always been able to eat and drink but has had some occasional nausea and vomiting. Usually she just belches and the pressure is released. Last night she developed a bandlike chest pain that went across her chest which prompted her to come to the emergency room. Her cardiac workup has been negative but her hiatal hernia appears to progress based on her history. Currently denies any fevers, chills, shortness of breath, nausea, vomiting, diarrhea, constipation.  HPI  Past Medical History:  Diagnosis Date  . Arthritis   . Atrial fibrillation ([place])   . Carcinoma of the skin, basal cell   . CHF (congestive heart failure) ([place])   . Chickenpox   . CKD (chronic kidney disease)   . GERD (gastroesophageal reflux disease)   . Hypertension   . Low sodium levels   . Pacemaker   . Urge incontinence   . UTI (lower urinary tract infection)     Past Surgical History:  Procedure Laterality Date  . ABDOMINAL HYSTERECTOMY    . APPENDECTOMY    . BASAL CELL CARCINOMA EXCISION Left    cheek  . broken pelvis    . BUNIONECTOMY Bilateral   . CARDIAC CATHETERIZATION    . CORONARY ANGIOGRAM  2015   [place]   . CORONARY ANGIOGRAM  2015  . HEMORRHOID SURGERY    . PACEMAKER INSERTION  12/25/2008   [employer] pacemaker DRRF 2210 Serial [id number]  . PACEMAKER PLACEMENT  2005  . SKIN SURGERY     removal of a mass on cheek  . SMALL INTESTINE SURGERY  2016  . TONSILLECTOMY      Family History   Problem Relation Age of Onset  . Heart disease Father   . Stroke Father   . Hypertension Father     Social History Social History  Substance Use Topics  . Smoking status: Never Smoker  . Smokeless tobacco: Never Used  . Alcohol use No    Allergies  Allergen Reactions  . Tetanus Toxoids     Current Facility-Administered Medications  Medication Dose Route Frequency Provider Last Rate Last Dose  . acetaminophen (TYLENOL) tablet 650 mg  650 mg Oral Q6H PRN Lance Coon, MD       Or  . acetaminophen (TYLENOL) suppository 650 mg  650 mg Rectal Q6H PRN Lance Coon, MD      . apixaban [facility]) tablet 2.5 mg  2.5 mg Oral BID Lance Coon, MD   2.5 mg at 10/09/16 1012  . HYDROcodone-acetaminophen (NORCO) 7.5-325 MG per tablet 0.5 tablet  0.5 tablet Oral Q6H PRN Lance Coon, MD   0.5 tablet at 10/09/16 1021  . HYDROmorphone (DILAUDID) injection 0.5 mg  0.5 mg Intravenous Q6H PRN Lance Coon, MD   0.5 mg at 10/09/16 0257  . metoprolol succinate (TOPROL-XL) 24 hr tablet 50 mg  50 mg Oral Daily Lance Coon, MD      . ondansetron [facility]) tablet 4 mg  4 mg Oral Q6H  PRN Lance Coon, MD       Or  . ondansetron [facility]) injection 4 mg  4 mg Intravenous Q6H PRN Lance Coon, MD   4 mg at 10/09/16 0257  . oxybutynin (DITROPAN) tablet 5 mg  5 mg Oral BID Lance Coon, MD   5 mg at 10/09/16 1012  . pantoprazole (PROTONIX) EC tablet 40 mg  40 mg Oral Daily Lance Coon, MD   40 mg at 10/09/16 1012  . sodium chloride flush (NS) 0.9 % injection 3 mL  3 mL Intravenous Q12H Lance Coon, MD   3 mL at 10/09/16 1013  . traZODone (DESYREL) tablet 50 mg  50 mg Oral QHS Harrie Foreman, MD         Review of Systems A Multi-point review of systems was asked and was negative except for the findings documented in the history of present illness  Physical Exam Blood pressure (!) 105/58, pulse 70, temperature 98.2 F (36.8 C), temperature source Oral, resp. rate 18, height 5' (1.524 m), weight 53.8 kg  (118 lb 8 oz), SpO2 94 %. CONSTITUTIONAL: No acute distress. EYES: Pupils are equal, round, and reactive to light, Sclera are non-icteric. EARS, NOSE, MOUTH AND THROAT: The oropharynx is clear. The oral mucosa is pink and moist. Hearing is intact to voice. LYMPH NODES:  Lymph nodes in the neck are normal. RESPIRATORY:  Lungs are clear but with gastric sounds present within the chest. There is normal respiratory effort, with equal breath sounds bilaterally, and without pathologic use of accessory muscles. CARDIOVASCULAR: Heart is regular without murmurs, gallops, or rubs. GI: The abdomen is slender, soft, nontender, and nondistended. There are no palpable masses. There is no hepatosplenomegaly. There are normal bowel sounds in all quadrants. GU: Rectal deferred.   MUSCULOSKELETAL: Normal muscle strength and tone. No cyanosis or edema.   SKIN: Turgor is good and there are no pathologic skin lesions or ulcers. NEUROLOGIC: Motor and sensation is grossly normal. Cranial nerves are grossly intact. PSYCH:  Oriented to person, place and time. Affect is normal.  Data Reviewed Images and labs reviewed. Labs are significant for hyponatremia of 129 but otherwise unremarkable. CT scan reviewed which does show a large hiatal hernia with the majority of the stomach within the chest. There is no evidence of volvulus or obstruction on the scan. I have personally reviewed the patient's imaging, laboratory findings and medical records.    Assessment    Large hiatal hernia    Plan    82 year old female with a large increasingly symptomatic hiatal hernia. Had a long conversation with the patient and her niece about the problem as well as the surgical care for it. After this discussion it was determined that she would likely be better cared for at a tertiary care center. They voiced understanding and agreement with this plan. Outpatient referral to [facility] was started today by my clinic staff. Discussed  that should this become emergent with evidence of volvulus and gastric outlet obstruction without the ability to transfer her I would take care of her here but only in the setting of emergency without available transfer.     Time spent with the patient was 45 minutes, with more than 50% of the time spent in face-to-face education, counseling and care coordination.     Clayburn Pert, MD FACS General Surgeon 10/09/2016, 11:54 AM

## 2016-10-10 DIAGNOSIS — R079 Chest pain, unspecified: Secondary | ICD-10-CM | POA: Diagnosis not present

## 2016-10-10 DIAGNOSIS — K449 Diaphragmatic hernia without obstruction or gangrene: Secondary | ICD-10-CM | POA: Diagnosis not present

## 2016-10-10 DIAGNOSIS — I4891 Unspecified atrial fibrillation: Secondary | ICD-10-CM | POA: Diagnosis not present

## 2016-10-10 LAB — ECHOCARDIOGRAM COMPLETE
Height: 60 in
WEIGHTICAEL: 1896 [oz_av]

## 2016-10-10 MED ORDER — ONDANSETRON HCL 4 MG PO TABS: 4.0000 mg | ORAL_TABLET | Freq: Four times a day (QID) | ORAL | 0 refills | Status: AC | PRN

## 2016-10-10 MED ORDER — PANTOPRAZOLE SODIUM 40 MG PO TBEC: 40.0000 mg | DELAYED_RELEASE_TABLET | Freq: Every day | ORAL | 0 refills | Status: DC

## 2016-10-10 NOTE — Care Management (Signed)
Received return call from [place].  [facility] is not in network with [place].  If patient is going to require a service THAT CAN ONLY BE PROVIDED AT THAT FACILITY, Health Team Contract department can discuss a contract at in network rates with [facility]. Otherwise, would need to pursue in network facility if wish to remain with in network.  At present, [facility] network facilities are the only facilities with contracts with [place].  Informed patient's daughter

## 2016-10-10 NOTE — Progress Notes (Signed)
Patient given discharge teaching and paperwork regarding medications, diet, follow-up appointments and activity. Patient understanding verbalized. No complaints at this time. IV and telemetry discontinued prior to leaving. Skin assessment as previously charted and vitals are stable; on room air. Patient being discharged to home. Caregiver/family present during discharge teaching. No further needs by Care Management. Prescriptions sent to [facility] by MD.

## 2016-10-10 NOTE — Progress Notes (Signed)
[facility] NAME: Darlene Yoder    MR#:  [id number]  DATE OF BIRTH:  [birth year]  SUBJECTIVE:d/c home today.  CHIEF COMPLAINT:   Chief Complaint  Patient presents with  . Chest Pain    REVIEW OF SYSTEMS:   ROS CONSTITUTIONAL: No fever, fatigue or weakness.  EYES: No blurred or double vision.  EARS, NOSE, AND THROAT: No tinnitus or ear pain.  RESPIRATORY: No cough, shortness of breath, wheezing or hemoptysis.  CARDIOVASCULAR: No chest pain, orthopnea, edema.  GASTROINTESTINAL: No nausea, vomiting, diarrhea or abdominal pain.  GENITOURINARY: No dysuria, hematuria.  ENDOCRINE: No polyuria, nocturia,  HEMATOLOGY: No anemia, easy bruising or bleeding SKIN: No rash or lesion. MUSCULOSKELETAL: No joint pain or arthritis.   NEUROLOGIC: No tingling, numbness, weakness.  PSYCHIATRY: No anxiety or depression.   DRUG ALLERGIES:   Allergies  Allergen Reactions  . Tetanus Toxoids     VITALS:  Blood pressure (!) 140/51, pulse 70, temperature 97.6 F (36.4 C), resp. rate 17, height 5' (1.524 m), weight 53 kg (116 lb 12.8 oz), SpO2 96 %.  PHYSICAL EXAMINATION:  GENERAL:  82 y.o.-year-old patient lying in the bed with no acute distress.  EYES: Pupils equal, round, reactive to light and accommodation. No scleral icterus. Extraocular muscles intact.  HEENT: Head atraumatic, normocephalic. Oropharynx and nasopharynx clear.  NECK:  Supple, no jugular venous distention. No thyroid enlargement, no tenderness.  LUNGS: Normal breath sounds bilaterally, no wheezing, rales,rhonchi or crepitation. No use of accessory muscles of respiration.  CARDIOVASCULAR: S1, S2 normal. No murmurs, rubs, or gallops.  ABDOMEN: Soft, nontender, nondistended. Bowel sounds present. No organomegaly or mass.  EXTREMITIES: No pedal edema, cyanosis, or clubbing.  NEUROLOGIC: Cranial nerves II through XII are intact. Muscle strength 5/5 in all extremities.  Sensation intact. Gait not checked.  PSYCHIATRIC: The patient is alert and oriented x 3.  SKIN: No obvious rash, lesion, or ulcer.    LABORATORY PANEL:   CBC  Recent Labs Lab 10/09/16 0318  WBC 8.6  HGB 11.8*  HCT 34.5*  PLT 284   ------------------------------------------------------------------------------------------------------------------  Chemistries   Recent Labs Lab 10/08/16 1643 10/09/16 0318  NA 132* 129*  K 4.2 4.6  CL 98* 95*  CO2 23 27  GLUCOSE 110* 137*  BUN 16 19  CREATININE 1.02* 1.04*  CALCIUM 9.7 8.8*  AST 19  --   ALT 9*  --   ALKPHOS 55  --   BILITOT 0.7  --    ------------------------------------------------------------------------------------------------------------------  Cardiac Enzymes  Recent Labs Lab 10/09/16 1515  TROPONINI 0.04*   ------------------------------------------------------------------------------------------------------------------  RADIOLOGY:  Ct Abdomen Pelvis Wo Contrast  Result Date: 10/08/2016 CLINICAL DATA:  Acute onset of generalized chest pain and pressure, radiating to the back. Atrial fibrillation. Initial encounter. EXAM: CT ABDOMEN AND PELVIS WITHOUT CONTRAST TECHNIQUE: Multidetector CT imaging of the abdomen and pelvis was performed following the standard protocol without IV contrast. COMPARISON:  Abdominal radiograph performed earlier today at 5:27 p.m. FINDINGS: Lower chest: Trace bilateral pleural effusions noted, left greater than right, with left basilar atelectasis, reflecting the patient's large hiatal hernia. The patient's large hiatal hernia contains most of the stomach, and is filled with contrast and air. Diffuse coronary artery calcifications are seen. Pacemaker leads are partially imaged, with a pacemaker noted at the left chest wall. Hepatobiliary: The liver is unremarkable in appearance. Stones are noted dependently within the gallbladder. The gallbladder is otherwise grossly unremarkable. The  common bile duct  is dilated to 1.7 cm in diameter, raising concern for distal obstruction. Pancreas: The pancreas is diffusely atrophic and not well characterized. There is no evidence for pancreatitis. Spleen: The spleen is grossly unremarkable in appearance. Adrenals/Urinary Tract: The adrenal glands are grossly unremarkable. Mild bilateral renal atrophy is noted, with prominent vascular calcifications noted at the renal hila bilaterally. There is no evidence of hydronephrosis. No definite renal or ureteral stones are identified, though evaluation for renal stones is limited given vascular calcifications. Minimal nonspecific left-sided perinephric stranding is noted. Stomach/Bowel: The stomach is unremarkable in appearance. The small bowel is within normal limits. The patient is status post appendectomy. Mild diverticulosis is noted along the descending and sigmoid colon, without evidence of diverticulitis. Vascular/Lymphatic: Scattered calcification is seen along the abdominal aorta and its branches. The abdominal aorta is otherwise grossly unremarkable. The inferior vena cava is grossly unremarkable. No retroperitoneal lymphadenopathy is seen. No pelvic sidewall lymphadenopathy is identified. Reproductive: The bladder is largely filled with contrast, and is unremarkable in appearance. The prostate remains normal in size. Other: No additional soft tissue abnormalities are seen. Musculoskeletal: No acute osseous abnormalities are identified. There are chronic compression deformities involving vertebral bodies T10, L2 and L5, with underlying facet disease. There is mild chronic deformity involving the right superior and inferior pubic rami. The visualized musculature is unremarkable in appearance. IMPRESSION: 1. Cholelithiasis, with dilatation of the common bile duct to 1.7 cm in diameter, raising concern for distal obstruction. Would correlate with LFTs and the patient's symptoms, and consider further evaluation  as deemed clinically appropriate. 2. Very large hiatal hernia, containing most of the stomach, filled with contrast and air. 3. Trace bilateral pleural effusions, left greater than right, with left basilar atelectasis. 4. Mild bilateral renal atrophy noted, with associated chronic vascular calcifications. 5. Mild diverticulosis along the descending and sigmoid colon, without evidence of diverticulitis. 6. Scattered aortic atherosclerosis. 7. Chronic compression deformities of vertebral bodies T10, L2 and L5. Electronically Signed   By: Garald Balding M.D.   On: 10/08/2016 23:29   Dg Chest 2 View  Result Date: 10/08/2016 CLINICAL DATA:  Epigastric pain and vomiting.  Chest heaviness. EXAM: CHEST  2 VIEW COMPARISON:  Abdominal radiographs 10/08/2016 FINDINGS: There is a massive air-fluid collection in the lower chest. Findings are compatible with a large hiatal hernia. Upper lungs are clear. Heart is obscured by the large hiatal hernia. Atherosclerotic calcifications at the aortic arch. No large pleural effusions. There are compression deformities in the lower thoracic spine and within the lumbar spine of unknown age. There is a left dual chamber cardiac pacemaker. IMPRESSION: Massive hiatal hernia. Based on the history of vomiting and epigastric pain, a gastric volvulus cannot be excluded. Spinal compression fractures of unknown age. Electronically Signed   By: Markus Daft M.D.   On: 10/08/2016 18:01   Ct Chest W Contrast  Result Date: 10/08/2016 CLINICAL DATA:  Chest pain that radiates to her back. EXAM: CT CHEST WITH CONTRAST TECHNIQUE: Multidetector CT imaging of the chest was performed during intravenous contrast administration. CONTRAST:  22mL ISOVUE-300 IOPAMIDOL (ISOVUE-300) INJECTION 61% COMPARISON:  Chest x-ray earlier the same day FINDINGS: Cardiovascular: Heart mildly enlarged with displacement due to large hiatal hernia. There is abdominal aortic atherosclerosis without aneurysm. No evidence for  thoracic aortic aneurysm. There is no dissection of the thoracic aorta. Opacification of the pulmonary arteries is suboptimal, but no large central pulmonary embolus is seen in the main pulmonary arteries or lobar pulmonary arteries. Segmental and subsegmental  pulmonary arteries not reliably evaluated on this study. Mediastinum/Nodes: Very large hiatal hernia contains nearly the entire stomach which is distended with fluid and gas. No mediastinal lymphadenopathy. There is no hilar lymphadenopathy. There is no axillary lymphadenopathy. Lungs/Pleura: Compressive atelectasis noted in the lower lobes bilaterally. No pulmonary edema. No focal airspace consolidation or pleural effusion. Upper Abdomen: Not well visualized. Musculoskeletal: Compression fracture lower thoracic vertebral body noted, likely nonacute. IMPRESSION: 1. Very large hiatal hernia has been incompletely visualized. The degree of distention with gas and fluid raises the question of outlet obstruction and configuration suggests organoaxial volvulus. Abdomen and pelvis CT to include the lower chest could be used to further evaluate, as clinically warranted. 2. The distended stomach generates mass-effect on the heart. 3. Abdominal aortic atherosclerosis. Electronically Signed   By: Misty Stanley M.D.   On: 10/08/2016 19:07   Dg Abd 2 Views  Result Date: 10/08/2016 CLINICAL DATA:  Epigastric pain and vomiting. EXAM: ABDOMEN - 2 VIEW COMPARISON:  Chest radiograph 10/08/2016 FINDINGS: There is a large air-fluid level in the lower chest. Findings are suggestive for a large hiatal hernia and possibly a gastric volvulus. No evidence for free intraperitoneal air. Bowel gas pattern in the lower abdomen and pelvis is within normal limits. Evidence for an old fracture involving the right superior pubic ramus. Atherosclerotic calcifications in the abdominal aorta and iliac arteries. Dual-chamber cardiac pacer leads are noted. IMPRESSION: Large air-fluid level in  the lower chest compatible with a large hiatal hernia. Based on the history, a gastric volvulus cannot be excluded. This may be better characterized with CT. These results were called by telephone at the time of interpretation on 10/08/2016 at 5:57 pm to Dr. Meade Maw , who verbally acknowledged these results. Electronically Signed   By: Markus Daft M.D.   On: 10/08/2016 17:59    EKG:   Orders placed or performed in visit on 08/19/16  . EKG 12-Lead    ASSESSMENT AND PLAN:  1.chest pain due to GERD;due to large hiatal hernia.;negative troponin's.Echo done.EF more than 65% Seen by surgery for hiatal hernia ,spoke with Dr.woodham.start diet,continue nausea meds ,pain meds and PPI.started diet , 2.Large hiatal hernia;seen by surgery,may be  Better served at [facility],send referral to [facility]. 3.chronic afib;rate controlled;continue Eliquis,metoprolol, D/w Darlene Yoder D/c home today,d/w with Darlene Yoder in detail.    All the records are reviewed and case discussed with Care Management/Social Workerr. Management plans discussed with the patient, family and they are in agreement.  CODE STATUS:full  TOTAL TIME TAKING CARE OF THIS PATIENT: 35 minutes.   D/c home today  Darlene Yoder M.D on 10/10/2016 at 2:17 PM  Between 7am to 6pm - Pager - [phone]  After 6pm go to [email or web address] - password [email or web address] [facility]  Office  [phone]  CC: Primary care physician; Tommi Rumps, MD   Note: This dictation was prepared with Dragon dictation along with smaller phrase technology. Any transcriptional errors that result from this process are unintentional.

## 2016-10-10 NOTE — Telephone Encounter (Signed)
I have faxed clinic notes and referral to [facility] @ [phone]. I have spoke with Lexine Baton, the receptionist, and she states that the patient should receive a call for the appointment by 10/15/16. She has advised me to call [phone] to follow up on appointment status.

## 2016-10-14 NOTE — Discharge Summary (Signed)
Darlene Yoder, is a 82 y.o. female  DOB May 30, 1922  MRN [id number].  Admission date:  10/08/2016  Admitting Physician  Lance Coon, MD  Discharge Date:  10/10/2016   Primary MD  Tommi Rumps, MD  Recommendations for primary care physician for things to follow:   Follow-up with primary doctor in 1 week Outpatient referral to [facility] for surgical consult for hiatal hernia.   Admission Diagnosis  Other chest pain [R07.89] Abdominal pain [R10.9]   Discharge Diagnosis  Other chest pain [R07.89] Abdominal pain [R10.9]   Principal Problem:   Chest pain Active Problems:   Atrial fibrillation (HCC)   Essential hypertension   GERD (gastroesophageal reflux disease)      Past Medical History:  Diagnosis Date  . Arthritis   . Atrial fibrillation ([place])   . Carcinoma of the skin, basal cell   . CHF (congestive heart failure) ([place])   . Chickenpox   . CKD (chronic kidney disease)   . GERD (gastroesophageal reflux disease)   . Hypertension   . Low sodium levels   . Pacemaker   . Urge incontinence   . UTI (lower urinary tract infection)     Past Surgical History:  Procedure Laterality Date  . ABDOMINAL HYSTERECTOMY    . APPENDECTOMY    . BASAL CELL CARCINOMA EXCISION Left    cheek  . broken pelvis    . BUNIONECTOMY Bilateral   . CARDIAC CATHETERIZATION    . CORONARY ANGIOGRAM  2015   [place]   . CORONARY ANGIOGRAM  2015  . HEMORRHOID SURGERY    . PACEMAKER INSERTION  12/25/2008   [employer] pacemaker DRRF 2210 Serial [id number]  . PACEMAKER PLACEMENT  2005  . SKIN SURGERY     removal of a mass on cheek  . SMALL INTESTINE SURGERY  2016  . TONSILLECTOMY         History of present illness and  Hospital Course:     Kindly see H&P for history of present illness and admission details, please review  complete Labs, Consult reports and Test reports for all details in brief  HPI  from the history and physical done on the day of admission Darlene Yoder  is a 82 y.o. female who presents with Chest pain. Patient has a known history of a significant hiatal hernia, and this was seen again on imaging in the ED today. Her cardiac workup so far in the ED is negative. She required significant pain medicine and antiemetics in the ED. Hospitals were called for observation due to her persistent pain and nausea.   Hospital Course  Chest pain ;; patient's troponins are negative for 3 times. Echocardiogram showed EF more than 65%. Chest pain is secondary to large hiatal hernia. Seen by surgery. Started on PPIs, pain medicine, nausea medicine, DR.Adonis Huguenin  said patient can be discharged home, long conversation with patient, her niece about the problem problem, patient can see [facility] surgery as an outpatient for symptomatic hiatal hernia. Discussed  with patient's niece in detail about using the nausea medicine, PPIs, soft diet.     Atrial fibrillation ([place]) - continue home meds including anticoagulation.    Essential hypertension - continue home meds    GERD (gastroesophageal reflux disease) - PPI     Discharge Condition: stable   Follow UP  Follow-up Information    Tommi Rumps, MD Follow up in 1 week(s).   Specialty:  Family Medicine Why:  Thursday, November 30 at 1130am, ccs Contact  information: [place] [address] [phone]             Discharge Instructions  and  Discharge Medications        Medication List    TAKE these medications   Calcium 600-200 MG-UNIT tablet Take 1 tablet by mouth daily.   CENTRUM SILVER PO Take by mouth.   ELIQUIS 2.5 MG Tabs tablet Generic drug:  apixaban TAKE ONE TABLET BY MOUTH TWICE DAILY   HYDROcodone-acetaminophen 7.5-325 MG tablet Commonly known as:  NORCO Limit one half to one tab by mouth  per day or 2-3 times per day if tolerated   metoprolol succinate 50 MG 24 hr tablet Commonly known as:  TOPROL-XL TAKE 1 TABLET (50 MG TOTAL) BY MOUTH DAILY. TAKE WITH OR IMMEDIATELY FOLLOWING A MEAL.   ondansetron 4 MG tablet Commonly known as:  ZOFRAN Take 1 tablet (4 mg total) by mouth every 6 (six) hours as needed for nausea.   oxybutynin 5 MG tablet Commonly known as:  DITROPAN Take 1 tablet (5 mg total) by mouth 2 (two) times daily.   pantoprazole 40 MG tablet Commonly known as:  PROTONIX Take 1 tablet (40 mg total) by mouth daily.   polyethylene glycol powder powder Commonly known as:  GLYCOLAX/MIRALAX TAKE 17 G BY MOUTH 2 (TWO) TIMES DAILY AS NEEDED.   traZODone 50 MG tablet Commonly known as:  DESYREL TAKE 1 TABLET (50 MG TOTAL) BY MOUTH AT BEDTIME.         Diet and Activity recommendation: See Discharge Instructions above   Consults obtained - surgery   Major procedures and Radiology Reports - PLEASE review detailed and final reports for all details, in brief -     Ct Abdomen Pelvis Wo Contrast  Result Date: 10/08/2016 CLINICAL DATA:  Acute onset of generalized chest pain and pressure, radiating to the back. Atrial fibrillation. Initial encounter. EXAM: CT ABDOMEN AND PELVIS WITHOUT CONTRAST TECHNIQUE: Multidetector CT imaging of the abdomen and pelvis was performed following the standard protocol without IV contrast. COMPARISON:  Abdominal radiograph performed earlier today at 5:27 p.m. FINDINGS: Lower chest: Trace bilateral pleural effusions noted, left greater than right, with left basilar atelectasis, reflecting the patient's large hiatal hernia. The patient's large hiatal hernia contains most of the stomach, and is filled with contrast and air. Diffuse coronary artery calcifications are seen. Pacemaker leads are partially imaged, with a pacemaker noted at the left chest wall. Hepatobiliary: The liver is unremarkable in appearance. Stones are noted  dependently within the gallbladder. The gallbladder is otherwise grossly unremarkable. The common bile duct is dilated to 1.7 cm in diameter, raising concern for distal obstruction. Pancreas: The pancreas is diffusely atrophic and not well characterized. There is no evidence for pancreatitis. Spleen: The spleen is grossly unremarkable in appearance. Adrenals/Urinary Tract: The adrenal glands are grossly unremarkable. Mild bilateral renal atrophy is noted, with prominent vascular calcifications noted at the renal hila bilaterally. There is no evidence of hydronephrosis. No definite renal or ureteral stones are identified, though evaluation for renal stones is limited given vascular calcifications. Minimal nonspecific left-sided perinephric stranding is noted. Stomach/Bowel: The stomach is unremarkable in appearance. The small bowel is within normal limits. The patient is status post appendectomy. Mild diverticulosis is noted along the descending and sigmoid colon, without evidence of diverticulitis. Vascular/Lymphatic: Scattered calcification is seen along the abdominal aorta and its branches. The abdominal aorta is otherwise grossly unremarkable. The inferior vena cava is grossly unremarkable. No retroperitoneal  lymphadenopathy is seen. No pelvic sidewall lymphadenopathy is identified. Reproductive: The bladder is largely filled with contrast, and is unremarkable in appearance. The prostate remains normal in size. Other: No additional soft tissue abnormalities are seen. Musculoskeletal: No acute osseous abnormalities are identified. There are chronic compression deformities involving vertebral bodies T10, L2 and L5, with underlying facet disease. There is mild chronic deformity involving the right superior and inferior pubic rami. The visualized musculature is unremarkable in appearance. IMPRESSION: 1. Cholelithiasis, with dilatation of the common bile duct to 1.7 cm in diameter, raising concern for distal  obstruction. Would correlate with LFTs and the patient's symptoms, and consider further evaluation as deemed clinically appropriate. 2. Very large hiatal hernia, containing most of the stomach, filled with contrast and air. 3. Trace bilateral pleural effusions, left greater than right, with left basilar atelectasis. 4. Mild bilateral renal atrophy noted, with associated chronic vascular calcifications. 5. Mild diverticulosis along the descending and sigmoid colon, without evidence of diverticulitis. 6. Scattered aortic atherosclerosis. 7. Chronic compression deformities of vertebral bodies T10, L2 and L5. Electronically Signed   By: Garald Balding M.D.   On: 10/08/2016 23:29   Dg Chest 2 View  Result Date: 10/08/2016 CLINICAL DATA:  Epigastric pain and vomiting.  Chest heaviness. EXAM: CHEST  2 VIEW COMPARISON:  Abdominal radiographs 10/08/2016 FINDINGS: There is a massive air-fluid collection in the lower chest. Findings are compatible with a large hiatal hernia. Upper lungs are clear. Heart is obscured by the large hiatal hernia. Atherosclerotic calcifications at the aortic arch. No large pleural effusions. There are compression deformities in the lower thoracic spine and within the lumbar spine of unknown age. There is a left dual chamber cardiac pacemaker. IMPRESSION: Massive hiatal hernia. Based on the history of vomiting and epigastric pain, a gastric volvulus cannot be excluded. Spinal compression fractures of unknown age. Electronically Signed   By: Markus Daft M.D.   On: 10/08/2016 18:01   Ct Chest W Contrast  Result Date: 10/08/2016 CLINICAL DATA:  Chest pain that radiates to her back. EXAM: CT CHEST WITH CONTRAST TECHNIQUE: Multidetector CT imaging of the chest was performed during intravenous contrast administration. CONTRAST:  21mL ISOVUE-300 IOPAMIDOL (ISOVUE-300) INJECTION 61% COMPARISON:  Chest x-ray earlier the same day FINDINGS: Cardiovascular: Heart mildly enlarged with displacement due  to large hiatal hernia. There is abdominal aortic atherosclerosis without aneurysm. No evidence for thoracic aortic aneurysm. There is no dissection of the thoracic aorta. Opacification of the pulmonary arteries is suboptimal, but no large central pulmonary embolus is seen in the main pulmonary arteries or lobar pulmonary arteries. Segmental and subsegmental pulmonary arteries not reliably evaluated on this study. Mediastinum/Nodes: Very large hiatal hernia contains nearly the entire stomach which is distended with fluid and gas. No mediastinal lymphadenopathy. There is no hilar lymphadenopathy. There is no axillary lymphadenopathy. Lungs/Pleura: Compressive atelectasis noted in the lower lobes bilaterally. No pulmonary edema. No focal airspace consolidation or pleural effusion. Upper Abdomen: Not well visualized. Musculoskeletal: Compression fracture lower thoracic vertebral body noted, likely nonacute. IMPRESSION: 1. Very large hiatal hernia has been incompletely visualized. The degree of distention with gas and fluid raises the question of outlet obstruction and configuration suggests organoaxial volvulus. Abdomen and pelvis CT to include the lower chest could be used to further evaluate, as clinically warranted. 2. The distended stomach generates mass-effect on the heart. 3. Abdominal aortic atherosclerosis. Electronically Signed   By: Misty Stanley M.D.   On: 10/08/2016 19:07   Dg Abd 2 Views  Result Date: 10/08/2016 CLINICAL DATA:  Epigastric pain and vomiting. EXAM: ABDOMEN - 2 VIEW COMPARISON:  Chest radiograph 10/08/2016 FINDINGS: There is a large air-fluid level in the lower chest. Findings are suggestive for a large hiatal hernia and possibly a gastric volvulus. No evidence for free intraperitoneal air. Bowel gas pattern in the lower abdomen and pelvis is within normal limits. Evidence for an old fracture involving the right superior pubic ramus. Atherosclerotic calcifications in the abdominal aorta  and iliac arteries. Dual-chamber cardiac pacer leads are noted. IMPRESSION: Large air-fluid level in the lower chest compatible with a large hiatal hernia. Based on the history, a gastric volvulus cannot be excluded. This may be better characterized with CT. These results were called by telephone at the time of interpretation on 10/08/2016 at 5:57 pm to Dr. Meade Maw , who verbally acknowledged these results. Electronically Signed   By: Markus Daft M.D.   On: 10/08/2016 17:59    Micro Results     No results found for this or any previous visit (from the past 240 hour(s)).     Today   Subjective:   Ludene Horgen today has no headache,no chest abdominal pain,no new weakness tingling or numbness, feels much better wants to go home today.   Objective:   Blood pressure (!) 140/51, pulse 70, temperature 97.6 F (36.4 C), resp. rate 17, height 5' (1.524 m), weight 53 kg (116 lb 12.8 oz), SpO2 96 %.  No intake or output data in the 24 hours ending 10/14/16 1043  Exam Awake Alert, Oriented x 3, No new F.N deficits, Normal affect [place].AT,PERRAL Supple Neck,No JVD, No cervical lymphadenopathy appriciated.  Symmetrical Chest wall movement, Good air movement bilaterally, CTAB RRR,No Gallops,Rubs or new Murmurs, No Parasternal Heave +ve B.Sounds, Abd Soft, Non tender, No organomegaly appriciated, No rebound -guarding or rigidity. No Cyanosis, Clubbing or edema, No new Rash or bruise  Data Review   CBC w Diff:  Lab Results  Component Value Date   WBC 8.6 10/09/2016   HGB 11.8 (L) 10/09/2016   HCT 34.5 (L) 10/09/2016   PLT 284 10/09/2016    CMP:  Lab Results  Component Value Date   NA 129 (L) 10/09/2016   K 4.6 10/09/2016   CL 95 (L) 10/09/2016   CO2 27 10/09/2016   BUN 19 10/09/2016   CREATININE 1.04 (H) 10/09/2016   PROT 6.7 10/08/2016   ALBUMIN 3.8 10/08/2016   BILITOT 0.7 10/08/2016   ALKPHOS 55 10/08/2016   AST 19 10/08/2016   ALT 9 (L) 10/08/2016  .   Total Time  in preparing paper work, data evaluation and todays exam - 58 minutes  Nesa Distel M.D on 10/10/2016 at 10:43 AM    Note: This dictation was prepared with Dragon dictation along with smaller phrase technology. Any transcriptional errors that result from this process are unintentional.

## 2016-10-19 ENCOUNTER — Other Ambulatory Visit: Payer: Self-pay | Admitting: Family Medicine

## 2016-10-20 ENCOUNTER — Ambulatory Visit (INDEPENDENT_AMBULATORY_CARE_PROVIDER_SITE_OTHER): Payer: PPO

## 2016-10-20 ENCOUNTER — Encounter: Payer: Self-pay | Admitting: Family

## 2016-10-20 ENCOUNTER — Ambulatory Visit (INDEPENDENT_AMBULATORY_CARE_PROVIDER_SITE_OTHER): Payer: PPO | Admitting: Family

## 2016-10-20 VITALS — BP 120/70 | HR 71 | Temp 98.2°F | Ht 60.0 in | Wt 118.8 lb

## 2016-10-20 DIAGNOSIS — J209 Acute bronchitis, unspecified: Secondary | ICD-10-CM | POA: Diagnosis not present

## 2016-10-20 DIAGNOSIS — R05 Cough: Secondary | ICD-10-CM | POA: Diagnosis not present

## 2016-10-20 MED ORDER — BENZONATATE 100 MG PO CAPS: 100.0000 mg | ORAL_CAPSULE | Freq: Two times a day (BID) | ORAL | 0 refills | Status: DC | PRN

## 2016-10-20 NOTE — Progress Notes (Signed)
Pre visit review using our clinic review tool, if applicable. No additional management support is needed unless otherwise documented below in the visit note. 

## 2016-10-20 NOTE — Patient Instructions (Addendum)
Suspect viral in nature.     Increase intake of clear fluids. Congestion is best treated by hydration, when mucus is wetter, it is thinner, less sticky, and easier to expel from the body, either through coughing up drainage, or by blowing your nose.   Get plenty of rest.   Use saline nasal drops and blow your nose frequently. Run a humidifier at night and elevate the head of the bed. Vicks Vapor rub will help with congestion and cough. Steam showers and sinus massage for congestion.   Use Acetaminophen or Ibuprofen as needed for fever or pain. Avoid second hand smoke. Even the smallest exposure will worsen symptoms.   Over the counter medications you can try include Delsym for cough, a decongestant for congestion, and Mucinex or Robitussin as an expectorant. Be sure to just get the plain Mucinex or Robitussin that just has one medication (Guaifenesen). We don't recommend the combination products. Note, be sure to drink two glasses of water with each dose of Mucinex as the medication will not work well without adequate hydration.   You can also try a teaspoon of honey to see if this will help reduce cough. Throat lozenges can sometimes be beneficial as well.    This illness will typically last 7 - 10 days.   Please follow up with our clinic if you develop a fever greater than 101 F, symptoms worsen, or do not resolve in the next week.

## 2016-10-20 NOTE — Progress Notes (Signed)
Subjective:    Patient ID: Darlene Yoder, female    DOB: 09-17-1922, 82 y.o.   MRN: [id number]  CC: Darlene Yoder is a 82 y.o. female who presents today for an acute visit.    HPI: CC: productive cough, nasal congestion x  One day, unchanged. Endorses wheezing, post nasal drip. No SOB, fever, chills. Has tried flonase with little relief.   No lung disease. H/o PNA 50 yrs ago.      HISTORY:  Past Medical History:  Diagnosis Date  . Arthritis   . Atrial fibrillation ([place])   . Carcinoma of the skin, basal cell   . CHF (congestive heart failure) ([place])   . Chickenpox   . CKD (chronic kidney disease)   . GERD (gastroesophageal reflux disease)   . Hypertension   . Low sodium levels   . Pacemaker   . Urge incontinence   . UTI (lower urinary tract infection)    Past Surgical History:  Procedure Laterality Date  . ABDOMINAL HYSTERECTOMY    . APPENDECTOMY    . BASAL CELL CARCINOMA EXCISION Left    cheek  . broken pelvis    . BUNIONECTOMY Bilateral   . CARDIAC CATHETERIZATION    . CORONARY ANGIOGRAM  2015   [place]   . CORONARY ANGIOGRAM  2015  . HEMORRHOID SURGERY    . PACEMAKER INSERTION  12/25/2008   [employer] pacemaker DRRF 2210 Serial [id number]  . PACEMAKER PLACEMENT  2005  . SKIN SURGERY     removal of a mass on cheek  . SMALL INTESTINE SURGERY  2016  . TONSILLECTOMY     Family History  Problem Relation Age of Onset  . Heart disease Father   . Stroke Father   . Hypertension Father     Allergies: Tetanus toxoids Current Outpatient Prescriptions on File Prior to Visit  Medication Sig Dispense Refill  . Calcium 600-200 MG-UNIT tablet Take 1 tablet by mouth daily.    Marland Kitchen ELIQUIS 2.5 MG TABS tablet TAKE ONE TABLET BY MOUTH TWICE DAILY 60 tablet 5  . HYDROcodone-acetaminophen (NORCO) 7.5-325 MG tablet Limit one half to one tab by mouth per day or 2-3 times per day if tolerated 60 tablet 0  . metoprolol succinate (TOPROL-XL) 50 MG 24 hr tablet TAKE 1 TABLET (50 MG TOTAL) BY  MOUTH DAILY. TAKE WITH OR IMMEDIATELY FOLLOWING A MEAL. 90 tablet 0  . Multiple Vitamins-Minerals (CENTRUM SILVER PO) Take by mouth.    . ondansetron (ZOFRAN) 4 MG tablet Take 1 tablet (4 mg total) by mouth every 6 (six) hours as needed for nausea. 20 tablet 0  . oxybutynin (DITROPAN) 5 MG tablet Take 1 tablet (5 mg total) by mouth 2 (two) times daily. 60 tablet 11  . pantoprazole (PROTONIX) 40 MG tablet Take 1 tablet (40 mg total) by mouth daily. 30 tablet 0  . polyethylene glycol powder (GLYCOLAX/MIRALAX) powder TAKE 17 G BY MOUTH 2 (TWO) TIMES DAILY AS NEEDED. 527 g 1  . traZODone (DESYREL) 50 MG tablet TAKE 1 TABLET (50 MG TOTAL) BY MOUTH AT BEDTIME. 90 tablet 1   No current facility-administered medications on file prior to visit.     Social History  Substance Use Topics  . Smoking status: Never Smoker  . Smokeless tobacco: Never Used  . Alcohol use No    Review of Systems  Constitutional: Negative for chills and fever.  HENT: Positive for congestion, postnasal drip and rhinorrhea. Negative for sinus pain and sinus pressure.  Eyes: Negative for visual disturbance.  Respiratory: Positive for wheezing. Negative for cough and shortness of breath.   Cardiovascular: Negative for chest pain and palpitations.  Gastrointestinal: Negative for nausea and vomiting.  Neurological: Negative for dizziness and headaches.      Objective:    BP 120/70   Pulse 71   Temp 98.2 F (36.8 C) (Oral)   Ht 5' (1.524 m)   Wt 118 lb 12.8 oz (53.9 kg)   SpO2 98%   BMI 23.20 kg/m    Physical Exam  Constitutional: She appears well-developed and well-nourished.  HENT:  Head: Normocephalic and atraumatic.  Right Ear: Hearing, tympanic membrane, external ear and ear canal normal. No drainage, swelling or tenderness. No foreign bodies. Tympanic membrane is not erythematous and not bulging. No middle ear effusion. No decreased hearing is noted.  Left Ear: Hearing, tympanic membrane, external ear and  ear canal normal. No drainage, swelling or tenderness. No foreign bodies. Tympanic membrane is not erythematous and not bulging.  No middle ear effusion. No decreased hearing is noted.  Nose: Rhinorrhea present. Right sinus exhibits no maxillary sinus tenderness and no frontal sinus tenderness. Left sinus exhibits no maxillary sinus tenderness and no frontal sinus tenderness.  Mouth/Throat: Uvula is midline, oropharynx is clear and moist and mucous membranes are normal. No oropharyngeal exudate, posterior oropharyngeal edema, posterior oropharyngeal erythema or tonsillar abscesses.  Eyes: Conjunctivae are normal.  Cardiovascular: Regular rhythm, normal heart sounds and normal pulses.   Pulmonary/Chest: Effort normal and breath sounds normal. She has no wheezes. She has no rhonchi. She has no rales.  Lymphadenopathy:       Head (right side): No submental, no submandibular, no tonsillar, no preauricular, no posterior auricular and no occipital adenopathy present.       Head (left side): No submental, no submandibular, no tonsillar, no preauricular, no posterior auricular and no occipital adenopathy present.    She has no cervical adenopathy.  Neurological: She is alert.  Skin: Skin is warm and dry.  Psychiatric: She has a normal mood and affect. Her speech is normal and behavior is normal. Thought content normal.  Vitals reviewed.      Assessment & Plan:   1. Acute bronchitis, unspecified organism Afebrile. SaO2 within normal limits. Working diagnosis of viral bronchitis 1 day. Patient and I jointly agreed conservative management the next couple days, if not better patient has a follow-up appointment with PCP in 3 days and can be reevaluated. Pending chest x-ray to ensure no underlying bacterial pneumonia. - DG Chest 2 View - benzonatate (TESSALON) 100 MG capsule; Take 1 capsule (100 mg total) by mouth 2 (two) times daily as needed for cough.  Dispense: 20 capsule; Refill: 0    I am having  Darlene Yoder start on benzonatate. I am also having her maintain her oxybutynin, ELIQUIS, traZODone, Calcium, Multiple Vitamins-Minerals (CENTRUM SILVER PO), polyethylene glycol powder, HYDROcodone-acetaminophen, ondansetron, pantoprazole, and metoprolol succinate.   Meds ordered this encounter  Medications  . benzonatate (TESSALON) 100 MG capsule    Sig: Take 1 capsule (100 mg total) by mouth 2 (two) times daily as needed for cough.    Dispense:  20 capsule    Refill:  0    Order Specific Question:   Supervising Provider    Answer:   Crecencio Mc [[id number]]    Return precautions given.   Risks, benefits, and alternatives of the medications and treatment plan prescribed today were discussed, and patient expressed understanding.   Education regarding  symptom management and diagnosis given to patient on AVS.  Continue to follow with Tommi Rumps, MD for routine health maintenance.   Carolin Guernsey and I agreed with plan.   Mable Paris, FNP

## 2016-10-23 ENCOUNTER — Ambulatory Visit (INDEPENDENT_AMBULATORY_CARE_PROVIDER_SITE_OTHER): Payer: PPO | Admitting: Family Medicine

## 2016-10-23 ENCOUNTER — Other Ambulatory Visit: Payer: Self-pay | Admitting: Family Medicine

## 2016-10-23 ENCOUNTER — Encounter: Payer: Self-pay | Admitting: Family Medicine

## 2016-10-23 VITALS — BP 116/62 | HR 69 | Temp 98.1°F | Wt 118.0 lb

## 2016-10-23 DIAGNOSIS — K449 Diaphragmatic hernia without obstruction or gangrene: Secondary | ICD-10-CM | POA: Diagnosis not present

## 2016-10-23 DIAGNOSIS — E871 Hypo-osmolality and hyponatremia: Secondary | ICD-10-CM

## 2016-10-23 DIAGNOSIS — J209 Acute bronchitis, unspecified: Secondary | ICD-10-CM

## 2016-10-23 LAB — BASIC METABOLIC PANEL
BUN: 27 mg/dL — ABNORMAL HIGH (ref 6–23)
CALCIUM: 9.5 mg/dL (ref 8.4–10.5)
CO2: 23 meq/L (ref 19–32)
Chloride: 93 mEq/L — ABNORMAL LOW (ref 96–112)
Creatinine, Ser: 1.28 mg/dL — ABNORMAL HIGH (ref 0.40–1.20)
GFR: 41.42 mL/min — ABNORMAL LOW (ref >60.00)
GLUCOSE: 86 mg/dL (ref 70–99)
POTASSIUM: 4.4 meq/L (ref 3.5–5.1)
SODIUM: 129 meq/L — AB (ref 135–145)

## 2016-10-23 LAB — CBC
HCT: 36.2 % (ref 36.0–46.0)
HEMOGLOBIN: 12.2 g/dL (ref 12.0–15.0)
MCHC: 33.7 g/dL (ref 30.0–36.0)
MCV: 91.4 fl (ref 78.0–100.0)
PLATELETS: 275 10*3/uL (ref 150.0–400.0)
RBC: 3.95 Mil/uL (ref 3.87–5.11)
RDW: 13.8 % (ref 11.5–15.5)
WBC: 9.3 10*3/uL (ref 4.0–10.5)

## 2016-10-23 LAB — POCT INFLUENZA A/B
Influenza A, POC: NEGATIVE
Influenza B, POC: NEGATIVE

## 2016-10-23 NOTE — Assessment & Plan Note (Signed)
Low on most recent check in the hospital. [facility] recheck today.

## 2016-10-23 NOTE — Patient Instructions (Signed)
Nice to see you. Please continue to eat small bites of food and eat slower than previously.  Please continue the Tessalon for cough. Please continue to stay well hydrated. Monitor your symptoms. If you develop recurrent pain, nausea, vomiting, decreased bowel movements, or you develop shortness of breath, cough productive of blood, fevers, or any new or changing symptoms please seek medical attention immediately.

## 2016-10-23 NOTE — Progress Notes (Signed)
  Tommi Rumps, MD Phone: [phone]  Darlene Yoder is a 82 y.o. female who presents today for hospital follow-up.  Patient was admitted to the hospital and evaluated for chest pain. She had cardiac workup that was unremarkable for cause. It was determined that her discomfort was related to a hiatal hernia. She was evaluated by surgery who advised her to see a surgeon at [facility] to consider management of this. She notes since discharge she's been eating slower and smaller bites of food and this has been helpful. She has Zofran to take for any nausea though she has not had any nausea. She's been having good bowel movements.  She was seen several days ago and diagnosed with bronchitis. At that time she had some wheezing and postnasal drip. She notes continued cough productive of yellow mucus. Some rhinorrhea. She notes overall her symptoms have been improving. Had some mild shortness of breath with coughing several days ago. Had a little chill. Also notes several days ago did have some mildly blood streaked mucus on cough. None since then. Overall improving.  PMH: nonsmoker.   ROS see history of present illness  Objective  Physical Exam Vitals:   10/23/16 1127  BP: 116/62  Pulse: 69  Temp: 98.1 F (36.7 C)    BP Readings from Last 3 Encounters:  10/23/16 116/62  10/20/16 120/70  10/10/16 (!) 140/51   Wt Readings from Last 3 Encounters:  10/23/16 118 lb (53.5 kg)  10/20/16 118 lb 12.8 oz (53.9 kg)  10/10/16 116 lb 12.8 oz (53 kg)    Physical Exam  Constitutional: No distress.  HENT:  Head: Normocephalic and atraumatic.  Mouth/Throat: Oropharynx is clear and moist. No oropharyngeal exudate.  Eyes: Conjunctivae are normal. Pupils are equal, round, and reactive to light.  Cardiovascular: Normal rate, regular rhythm and normal heart sounds.   Pulmonary/Chest: Effort normal. No respiratory distress. She has no wheezes. She has no rales.  Mildly coarse breath sounds bilaterally    Abdominal: Soft. She exhibits no distension. There is no tenderness.  Neurological: She is alert.  Skin: Skin is warm and dry. She is not diaphoretic.     Assessment/Plan: Please see individual problem list.  Hyponatremia Low on most recent check in the hospital. [facility] recheck today.  Acute bronchitis Slowly improving. Oxygen in the normal range. Rapid flu negative. She'll continue Tessalon. Continue to monitor. Suspect viral bronchitis. Given return precautions.  Hiatal hernia Suspect this is the cause of the discomfort she had leading to hospitalization. Had negative cardiac workup with this. She has an appointment with the surgeon at [facility] to evaluate for surgical options in January. She's asymptomatic at this time. Is having good bowel movements. Discussed continuing to monitor. If she has any recurrence of symptoms she will be evaluated immediately. Given return precautions.   Orders Placed This Encounter  Procedures  . Basic metabolic panel  . CBC  . POCT Influenza A/B    Tommi Rumps, MD [place]

## 2016-10-23 NOTE — Assessment & Plan Note (Addendum)
Slowly improving. Oxygen in the normal range. Rapid flu negative. She'll continue Tessalon. Continue to monitor. Suspect viral bronchitis. Given return precautions.

## 2016-10-23 NOTE — Progress Notes (Signed)
Pre visit review using our clinic review tool, if applicable. No additional management support is needed unless otherwise documented below in the visit note. 

## 2016-10-23 NOTE — Assessment & Plan Note (Signed)
Suspect this is the cause of the discomfort she had leading to hospitalization. Had negative cardiac workup with this. She has an appointment with the surgeon at [facility] to evaluate for surgical options in January. She's asymptomatic at this time. Is having good bowel movements. Discussed continuing to monitor. If she has any recurrence of symptoms she will be evaluated immediately. Given return precautions.

## 2016-10-28 ENCOUNTER — Other Ambulatory Visit: Payer: PPO

## 2016-10-28 DIAGNOSIS — M47812 Spondylosis without myelopathy or radiculopathy, cervical region: Secondary | ICD-10-CM | POA: Diagnosis not present

## 2016-10-28 DIAGNOSIS — M5412 Radiculopathy, cervical region: Secondary | ICD-10-CM | POA: Diagnosis not present

## 2016-10-28 DIAGNOSIS — M5416 Radiculopathy, lumbar region: Secondary | ICD-10-CM | POA: Diagnosis not present

## 2016-10-28 DIAGNOSIS — M47817 Spondylosis without myelopathy or radiculopathy, lumbosacral region: Secondary | ICD-10-CM | POA: Diagnosis not present

## 2016-10-30 ENCOUNTER — Other Ambulatory Visit (INDEPENDENT_AMBULATORY_CARE_PROVIDER_SITE_OTHER): Payer: PPO

## 2016-10-30 DIAGNOSIS — E871 Hypo-osmolality and hyponatremia: Secondary | ICD-10-CM

## 2016-10-30 LAB — BASIC METABOLIC PANEL
BUN: 10 mg/dL (ref 6–23)
CHLORIDE: 96 meq/L (ref 96–112)
CO2: 27 meq/L (ref 19–32)
Calcium: 9.5 mg/dL (ref 8.4–10.5)
Creatinine, Ser: 0.91 mg/dL (ref 0.40–1.20)
GFR: 61.41 mL/min (ref >60.00)
Glucose, Bld: 103 mg/dL — ABNORMAL HIGH (ref 70–99)
POTASSIUM: 4.6 meq/L (ref 3.5–5.1)
SODIUM: 133 meq/L — AB (ref 135–145)

## 2016-11-03 ENCOUNTER — Other Ambulatory Visit: Payer: Self-pay | Admitting: Family Medicine

## 2016-11-06 ENCOUNTER — Other Ambulatory Visit: Payer: Self-pay | Admitting: Family Medicine

## 2016-11-18 ENCOUNTER — Ambulatory Visit (INDEPENDENT_AMBULATORY_CARE_PROVIDER_SITE_OTHER): Payer: PPO | Admitting: *Deleted

## 2016-11-18 DIAGNOSIS — Z950 Presence of cardiac pacemaker: Secondary | ICD-10-CM

## 2016-11-18 LAB — CUP PACEART INCLINIC DEVICE CHECK
Battery Voltage: 2.63 V
Date Time Interrogation Session: 20171226144249
Implantable Lead Implant Date: 20100201
Implantable Lead Implant Date: 20100201
Implantable Lead Location: 753859
Implantable Pulse Generator Implant Date: 20100201
Lead Channel Pacing Threshold Amplitude: 0.75 V
Lead Channel Pacing Threshold Pulse Width: 0.5 ms
Lead Channel Setting Pacing Amplitude: 2.5 V
Lead Channel Setting Sensing Sensitivity: 1.5 mV
MDC IDC LEAD LOCATION: 753860
MDC IDC MSMT LEADCHNL RA IMPEDANCE VALUE: 400 Ohm
MDC IDC MSMT LEADCHNL RA SENSING INTR AMPL: 1.3 mV
MDC IDC MSMT LEADCHNL RV IMPEDANCE VALUE: 587.5 Ohm
MDC IDC PG SERIAL: 2198848
MDC IDC SET LEADCHNL RV PACING AMPLITUDE: 2.5 V
MDC IDC SET LEADCHNL RV PACING PULSEWIDTH: 0.5 ms
MDC IDC STAT BRADY RA PERCENT PACED: 2.8 %
MDC IDC STAT BRADY RV PERCENT PACED: 99.81 %
Pulse Gen Model: 2210

## 2016-11-18 NOTE — Progress Notes (Signed)
Pacemaker check in clinic. Normal device function. Thresholds, sensing, impedances consistent with previous measurements. Device programmed to maximize longevity. AT/AF burden >99%, +Eliquis. 1 high ventricular rate noted--likely NSVT, duration 8bts. 1 atrial noise reversion, patient was having a CXR at the time per daughter, noise not reproducible with isometrics. Device programmed at appropriate safety margins. Histogram distribution blunted, but appropriate for patient activity level. Patient education completed. Estimated ERI in <3 months (0-3 months). ROV with SK/B on 12/16/16 to discuss generator change.

## 2016-11-25 DIAGNOSIS — I4891 Unspecified atrial fibrillation: Secondary | ICD-10-CM | POA: Diagnosis not present

## 2016-11-25 DIAGNOSIS — K449 Diaphragmatic hernia without obstruction or gangrene: Secondary | ICD-10-CM | POA: Diagnosis not present

## 2016-11-25 DIAGNOSIS — Z79899 Other long term (current) drug therapy: Secondary | ICD-10-CM | POA: Diagnosis not present

## 2016-11-25 DIAGNOSIS — Z950 Presence of cardiac pacemaker: Secondary | ICD-10-CM | POA: Diagnosis not present

## 2016-11-25 DIAGNOSIS — Z7901 Long term (current) use of anticoagulants: Secondary | ICD-10-CM | POA: Diagnosis not present

## 2016-11-25 HISTORY — DX: Diaphragmatic hernia without obstruction or gangrene: K44.9

## 2016-12-01 ENCOUNTER — Other Ambulatory Visit: Payer: Self-pay | Admitting: Pain Medicine

## 2016-12-01 DIAGNOSIS — M5416 Radiculopathy, lumbar region: Secondary | ICD-10-CM | POA: Diagnosis not present

## 2016-12-01 DIAGNOSIS — M47812 Spondylosis without myelopathy or radiculopathy, cervical region: Secondary | ICD-10-CM | POA: Diagnosis not present

## 2016-12-01 DIAGNOSIS — M47817 Spondylosis without myelopathy or radiculopathy, lumbosacral region: Secondary | ICD-10-CM | POA: Diagnosis not present

## 2016-12-01 DIAGNOSIS — M5412 Radiculopathy, cervical region: Secondary | ICD-10-CM | POA: Diagnosis not present

## 2016-12-08 ENCOUNTER — Other Ambulatory Visit: Payer: Self-pay | Admitting: Family Medicine

## 2016-12-11 ENCOUNTER — Ambulatory Visit: Payer: PPO | Admitting: Family Medicine

## 2016-12-16 ENCOUNTER — Ambulatory Visit (INDEPENDENT_AMBULATORY_CARE_PROVIDER_SITE_OTHER): Payer: PPO | Admitting: Internal Medicine

## 2016-12-16 ENCOUNTER — Encounter: Payer: Self-pay | Admitting: Internal Medicine

## 2016-12-16 VITALS — BP 100/80 | HR 70 | Ht 61.0 in | Wt 118.5 lb

## 2016-12-16 DIAGNOSIS — Z950 Presence of cardiac pacemaker: Secondary | ICD-10-CM | POA: Diagnosis not present

## 2016-12-16 DIAGNOSIS — I4819 Other persistent atrial fibrillation: Secondary | ICD-10-CM

## 2016-12-16 DIAGNOSIS — Z01812 Encounter for preprocedural laboratory examination: Secondary | ICD-10-CM | POA: Diagnosis not present

## 2016-12-16 DIAGNOSIS — I442 Atrioventricular block, complete: Secondary | ICD-10-CM | POA: Diagnosis not present

## 2016-12-16 DIAGNOSIS — I481 Persistent atrial fibrillation: Secondary | ICD-10-CM

## 2016-12-16 LAB — CUP PACEART INCLINIC DEVICE CHECK
Brady Statistic RA Percent Paced: 3.4 %
Implantable Lead Implant Date: 20100201
Implantable Pulse Generator Implant Date: 20100201
Lead Channel Impedance Value: 412.5 Ohm
Lead Channel Impedance Value: 600 Ohm
Lead Channel Setting Pacing Amplitude: 2.5 V
Lead Channel Setting Pacing Pulse Width: 0.5 ms
MDC IDC LEAD IMPLANT DT: 20100201
MDC IDC LEAD LOCATION: 753859
MDC IDC LEAD LOCATION: 753860
MDC IDC MSMT BATTERY VOLTAGE: 2.62 V
MDC IDC SESS DTM: 20180123153725
MDC IDC SET LEADCHNL RV PACING AMPLITUDE: 2.5 V
MDC IDC SET LEADCHNL RV SENSING SENSITIVITY: 1.5 mV
MDC IDC STAT BRADY RV PERCENT PACED: 99.96 %
Pulse Gen Serial Number: 2198848

## 2016-12-16 NOTE — Progress Notes (Signed)
Patient Care Team: Leone Haven, MD as PCP - General (Family Medicine)   HPI  Darlene Yoder is a 83 y.o. female Seen in follow-up for pacemaker implantation remotely. Her device is rapidly approaching ERI She is mostly wheel chair bound but mentally active and sharp   She has a history of persistent atrial fibrillation for which she is anticoagulated with apixoban.  Thromboembolic risk profile notable for age-11 gender-1 hypertension-1  Records and Results Reviewed   Past Medical History:  Diagnosis Date  . Arthritis   . Atrial fibrillation ([place])   . Carcinoma of the skin, basal cell   . CHF (congestive heart failure) ([place])   . Chickenpox   . CKD (chronic kidney disease)   . GERD (gastroesophageal reflux disease)   . Hypertension   . Low sodium levels   . Pacemaker   . Urge incontinence   . UTI (lower urinary tract infection)     Past Surgical History:  Procedure Laterality Date  . ABDOMINAL HYSTERECTOMY    . APPENDECTOMY    . BASAL CELL CARCINOMA EXCISION Left    cheek  . broken pelvis    . BUNIONECTOMY Bilateral   . CARDIAC CATHETERIZATION    . CORONARY ANGIOGRAM  2015   [place]   . CORONARY ANGIOGRAM  2015  . HEMORRHOID SURGERY    . PACEMAKER INSERTION  12/25/2008   [employer] JUDE pacemaker DRRF 2210 Serial [id number]  . PACEMAKER PLACEMENT  2005  . SKIN SURGERY     removal of a mass on cheek  . SMALL INTESTINE SURGERY  2016  . TONSILLECTOMY      Current Outpatient Prescriptions  Medication Sig Dispense Refill  . Calcium 600-200 MG-UNIT tablet Take 1 tablet by mouth daily.    Marland Kitchen ELIQUIS 2.5 MG TABS tablet TAKE ONE TABLET BY MOUTH TWICE DAILY 60 tablet 5  . HYDROcodone-acetaminophen (NORCO) 7.5-325 MG tablet Limit one half to one tab by mouth per day or 2-3 times per day if tolerated 60 tablet 0  . metoprolol succinate (TOPROL-XL) 50 MG 24 hr tablet TAKE 1 TABLET (50 MG TOTAL) BY MOUTH DAILY. TAKE WITH OR IMMEDIATELY FOLLOWING A MEAL. 90 tablet 0  .  Multiple Vitamins-Minerals (CENTRUM SILVER PO) Take by mouth.    . ondansetron (ZOFRAN) 4 MG tablet Take 1 tablet (4 mg total) by mouth every 6 (six) hours as needed for nausea. 20 tablet 0  . oxybutynin (DITROPAN) 5 MG tablet Take 1 tablet (5 mg total) by mouth 2 (two) times daily. 60 tablet 11  . pantoprazole (PROTONIX) 40 MG tablet TAKE 1 TABLET EVERY DAY 30 tablet 0  . polyethylene glycol powder (GLYCOLAX/MIRALAX) powder TAKE 17 G BY MOUTH 2 (TWO) TIMES DAILY AS NEEDED. 527 g 1  . traZODone (DESYREL) 50 MG tablet TAKE 1 TABLET (50 MG TOTAL) BY MOUTH AT BEDTIME. 90 tablet 1   No current facility-administered medications for this visit.     Allergies  Allergen Reactions  . Tetanus Toxoids       Review of Systems negative except from HPI and PMH  Physical Exam BP 100/80 (BP Location: Left Arm, Patient Position: Sitting, Cuff Size: Normal)   Pulse 70   Ht 5\' 1"  (1.549 m)   Wt 118 lb 8 oz (53.8 kg)   BMI 22.39 kg/m  Well developed and Cachectic age appearing Caucasian female sitting in a wheelchair in no acute distress HENT normal E scleral and icterus clear Neck Supple JVP flat;  carotids brisk and full Clear to ausculation Device pocket well healed; without hematoma or erythema.  There is no tethering there is significant migration of about 4-5 inches.  Regular rate and rhythm, no murmurs gallops or rub Soft with active bowel sounds No clubbing cyanosis  Edema Alert and oriented, grossly normal motor and sensory function Skin Warm and Dry  ECG demonstrates atrial fibrillation with ventricular pacing  Assessment and  Plan   Complete heart block  Persistent Atrial fibrillation  Pacemaker St Judes  Hyponatremia  Device is nearly at ERI. Generator replacement will be necessary.  We would also anticipate device repositioning to a more cephalad location with care not to affect to much the hairpin turn noted in the anterior axillary region  We have reviewed the  benefits and risks of generator replacement.  These include but are not limited to lead fracture and infection.  The patient understands, agrees and is willing to proceed.  We will hold apixoban for 2 doses     Current medicines are reviewed at length with the patient today .  The patient does not  have concerns regarding medicines.

## 2016-12-16 NOTE — Patient Instructions (Signed)
Medication Instructions: - Your physician recommends that you continue on your current medications as directed. Please refer to the Current Medication list given to you today.  Labwork: - Your physician recommends that you have lab work at the [facility] lab: any day between 12/31/16- 01/12/17  (enter the [place] Entrance & check in at the first desk on the right, please take the orders with you that you received today)  Procedures/Testing: - Your physician has recommended that you have a pacemaker generator (battery) change out: Wednesday 01/14/17 at 10:00 am - you will need to arrive at [facility] at 8:00 am.   Follow-Up: - Your physician recommends that you schedule a follow-up appointment in: about 14 days (from 2/21) with the [facility] for a wound check.    Any Additional Special Instructions Will Be Listed Below (If Applicable).     If you need a refill on your cardiac medications before your next appointment, please call your pharmacy.

## 2016-12-17 ENCOUNTER — Encounter: Payer: Self-pay | Admitting: *Deleted

## 2016-12-24 ENCOUNTER — Other Ambulatory Visit: Payer: Self-pay | Admitting: Family Medicine

## 2016-12-24 ENCOUNTER — Ambulatory Visit (INDEPENDENT_AMBULATORY_CARE_PROVIDER_SITE_OTHER): Payer: PPO | Admitting: Family Medicine

## 2016-12-24 ENCOUNTER — Encounter: Payer: Self-pay | Admitting: Family Medicine

## 2016-12-24 VITALS — BP 158/82 | HR 69 | Temp 98.3°F | Wt 113.8 lb

## 2016-12-24 DIAGNOSIS — G479 Sleep disorder, unspecified: Secondary | ICD-10-CM

## 2016-12-24 DIAGNOSIS — I10 Essential (primary) hypertension: Secondary | ICD-10-CM

## 2016-12-24 DIAGNOSIS — R8290 Unspecified abnormal findings in urine: Secondary | ICD-10-CM | POA: Diagnosis not present

## 2016-12-24 DIAGNOSIS — I4819 Other persistent atrial fibrillation: Secondary | ICD-10-CM

## 2016-12-24 DIAGNOSIS — I481 Persistent atrial fibrillation: Secondary | ICD-10-CM | POA: Diagnosis not present

## 2016-12-24 DIAGNOSIS — K449 Diaphragmatic hernia without obstruction or gangrene: Secondary | ICD-10-CM

## 2016-12-24 DIAGNOSIS — R300 Dysuria: Secondary | ICD-10-CM

## 2016-12-24 LAB — POCT URINALYSIS DIPSTICK
BILIRUBIN UA: NEGATIVE
GLUCOSE UA: NEGATIVE
KETONES UA: NEGATIVE
Nitrite, UA: POSITIVE
PH UA: 5.5
Protein, UA: 30
Spec Grav, UA: 1.01
Urobilinogen, UA: 0.2

## 2016-12-24 LAB — URINALYSIS, MICROSCOPIC ONLY

## 2016-12-24 MED ORDER — TRAZODONE HCL 50 MG PO TABS: 50.0000 mg | ORAL_TABLET | Freq: Every day | ORAL | 1 refills | Status: DC

## 2016-12-24 MED ORDER — CEPHALEXIN 500 MG PO CAPS: 500.0000 mg | ORAL_CAPSULE | Freq: Two times a day (BID) | ORAL | 0 refills | Status: DC

## 2016-12-24 NOTE — Assessment & Plan Note (Signed)
Asymptomatic. Rate controlled. Continue current medications. Keep follow-up with cardiology for pacemaker battery replacement.

## 2016-12-24 NOTE — Assessment & Plan Note (Signed)
Evaluated by surgery at [facility]. They are deferring surgery at this time. She'll continue to monitor.

## 2016-12-24 NOTE — Assessment & Plan Note (Signed)
Slight elevation today though was normal last week and has been well controlled. She'll continue to monitor this.

## 2016-12-24 NOTE — Patient Instructions (Addendum)
Nice to see you. Please continue to monitor your blood pressure. It is slightly elevated today though has been well-controlled. You have a UTI. We will treat with Keflex. If you develop abdominal pain, fevers, or any new or changing symptoms please seek medical attention.

## 2016-12-24 NOTE — Progress Notes (Signed)
  Tommi Rumps, MD Phone: [phone]  Darlene Yoder is a 83 y.o. female who presents today for follow-up.  HYPERTENSION  Disease Monitoring  Home BP Monitoring not checking, normal last week at cardiology Chest pain- no    Dyspnea- no Medications  Compliance-  Taking metoprolol.  Edema- stable  A. fib: No palpitations. Taking Eliquis with no bleeding issues. She does have a pacemaker for complete heart block and she is preparing to have the battery replaced through [facility] cardiology.  Patient notes 2 nights ago she developed urinary frequency, dysuria, and urgency. Notes frequency is not as bad though continues to have dysuria and urgency. No fevers or abdominal pain or vaginal discharge. She does use a topical cream externally to help with the burning.  Patient also saw the surgeon at [facility] for her hiatal hernia and they did not recommend surgery.  PMH: nonsmoker.   ROS see history of present illness  Objective  Physical Exam Vitals:   12/24/16 1354 12/24/16 1418  BP: (!) 164/84 (!) 158/82  Pulse: 69   Temp: 98.3 F (36.8 C)     BP Readings from Last 3 Encounters:  12/24/16 (!) 158/82  12/16/16 100/80  10/23/16 116/62   Wt Readings from Last 3 Encounters:  12/24/16 113 lb 12.8 oz (51.6 kg)  12/16/16 118 lb 8 oz (53.8 kg)  10/23/16 118 lb (53.5 kg)    Physical Exam  Constitutional: No distress.  Cardiovascular: Normal rate, regular rhythm and normal heart sounds.   Pulmonary/Chest: Effort normal and breath sounds normal.  Abdominal: Soft. Bowel sounds are normal. She exhibits no distension. There is no tenderness. There is no rebound and no guarding.  Musculoskeletal:  Compression stockings in place with minimal swelling  Neurological: She is alert. Gait normal.  Skin: She is not diaphoretic.     Assessment/Plan: Please see individual problem list.  Atrial fibrillation ([place]) Asymptomatic. Rate controlled. Continue current medications. Keep follow-up with  cardiology for pacemaker battery replacement.  Essential hypertension Slight elevation today though was normal last week and has been well controlled. She'll continue to monitor this.  Hiatal hernia Evaluated by surgery at [facility]. They are deferring surgery at this time. She'll continue to monitor.  Dysuria Symptoms and UA most consistent with UTI. We'll send urine for culture and microscopy. We'll treat with Keflex. She is given return precautions.  Sleeping difficulty Doing well on trazodone. No excessive drowsiness. Refill given.   Orders Placed This Encounter  Procedures  . Urine Culture  . Urine Microscopic Only  . POCT Urinalysis Dipstick    Meds ordered this encounter  Medications  . cephALEXin (KEFLEX) 500 MG capsule    Sig: Take 1 capsule (500 mg total) by mouth 2 (two) times daily.    Dispense:  14 capsule    Refill:  0  . traZODone (DESYREL) 50 MG tablet    Sig: Take 1 tablet (50 mg total) by mouth at bedtime.    Dispense:  90 tablet    Refill:  1    Tommi Rumps, MD [place]

## 2016-12-24 NOTE — Assessment & Plan Note (Signed)
Doing well on trazodone. No excessive drowsiness. Refill given.

## 2016-12-24 NOTE — Assessment & Plan Note (Signed)
Symptoms and UA most consistent with UTI. We'll send urine for culture and microscopy. We'll treat with Keflex. She is given return precautions.

## 2016-12-24 NOTE — Telephone Encounter (Signed)
Last OV 10/23/16 last filled 06/30/16 90 1rf, patient is coming in today

## 2016-12-24 NOTE — Progress Notes (Signed)
Pre visit review using our clinic review tool, if applicable. No additional management support is needed unless otherwise documented below in the visit note. 

## 2016-12-27 LAB — URINE CULTURE

## 2016-12-29 DIAGNOSIS — M5416 Radiculopathy, lumbar region: Secondary | ICD-10-CM | POA: Diagnosis not present

## 2016-12-29 DIAGNOSIS — M5412 Radiculopathy, cervical region: Secondary | ICD-10-CM | POA: Diagnosis not present

## 2016-12-29 DIAGNOSIS — M47812 Spondylosis without myelopathy or radiculopathy, cervical region: Secondary | ICD-10-CM | POA: Diagnosis not present

## 2016-12-29 DIAGNOSIS — M47817 Spondylosis without myelopathy or radiculopathy, lumbosacral region: Secondary | ICD-10-CM | POA: Diagnosis not present

## 2017-01-06 ENCOUNTER — Other Ambulatory Visit
Admission: RE | Admit: 2017-01-06 | Discharge: 2017-01-06 | Disposition: A | Discharge status: Home / Self Care | Payer: PPO | Source: Ambulatory Visit | Attending: Internal Medicine | Admitting: Internal Medicine

## 2017-01-06 ENCOUNTER — Encounter: Payer: PPO | Admitting: Internal Medicine

## 2017-01-06 DIAGNOSIS — I442 Atrioventricular block, complete: Secondary | ICD-10-CM | POA: Diagnosis not present

## 2017-01-06 DIAGNOSIS — Z01812 Encounter for preprocedural laboratory examination: Secondary | ICD-10-CM | POA: Diagnosis not present

## 2017-01-06 DIAGNOSIS — I481 Persistent atrial fibrillation: Secondary | ICD-10-CM | POA: Insufficient documentation

## 2017-01-06 DIAGNOSIS — I4819 Other persistent atrial fibrillation: Secondary | ICD-10-CM

## 2017-01-06 LAB — CBC WITH DIFFERENTIAL/PLATELET
Basophils Absolute: 0 10*3/uL (ref 0–0.1)
Basophils Relative: 1 %
Eosinophils Absolute: 0.1 10*3/uL (ref 0–0.7)
Eosinophils Relative: 2 %
HEMATOCRIT: 38 % (ref 35.0–47.0)
HEMOGLOBIN: 12.8 g/dL (ref 12.0–16.0)
LYMPHS PCT: 37 %
Lymphs Abs: 2.5 10*3/uL (ref 1.0–3.6)
MCH: 31 pg (ref 26.0–34.0)
MCHC: 33.7 g/dL (ref 32.0–36.0)
MCV: 92.1 fL (ref 80.0–100.0)
Monocytes Absolute: 0.6 10*3/uL (ref 0.2–0.9)
Monocytes Relative: 9 %
NEUTROS PCT: 51 %
Neutro Abs: 3.5 10*3/uL (ref 1.4–6.5)
Platelets: 273 10*3/uL (ref 150–440)
RBC: 4.12 MIL/uL (ref 3.80–5.20)
RDW: 13.7 % (ref 11.5–14.5)
WBC: 6.8 10*3/uL (ref 3.6–11.0)

## 2017-01-06 LAB — BASIC METABOLIC PANEL
Anion gap: 9 (ref 5–15)
BUN: 10 mg/dL (ref 6–20)
CHLORIDE: 97 mmol/L — AB (ref 101–111)
CO2: 26 mmol/L (ref 22–32)
Calcium: 9 mg/dL (ref 8.9–10.3)
Creatinine, Ser: 0.82 mg/dL (ref 0.44–1.00)
GFR calc non Af Amer: >60 mL/min (ref >60)
Glucose, Bld: 100 mg/dL — ABNORMAL HIGH (ref 65–99)
POTASSIUM: 4 mmol/L (ref 3.5–5.1)
SODIUM: 132 mmol/L — AB (ref 135–145)

## 2017-01-08 ENCOUNTER — Other Ambulatory Visit: Payer: Self-pay | Admitting: Family Medicine

## 2017-01-14 ENCOUNTER — Encounter (HOSPITAL_COMMUNITY)
Admission: RE | Disposition: A | Discharge status: Home / Self Care | Payer: Self-pay | Source: Ambulatory Visit | Attending: Internal Medicine

## 2017-01-14 ENCOUNTER — Ambulatory Visit (HOSPITAL_COMMUNITY)
Admission: RE | Admit: 2017-01-14 | Discharge: 2017-01-14 | Disposition: A | Discharge status: Home / Self Care | Payer: PPO | Source: Ambulatory Visit | Attending: Internal Medicine | Admitting: Internal Medicine

## 2017-01-14 ENCOUNTER — Encounter (HOSPITAL_COMMUNITY): Payer: Self-pay | Admitting: Internal Medicine

## 2017-01-14 DIAGNOSIS — Z7901 Long term (current) use of anticoagulants: Secondary | ICD-10-CM | POA: Insufficient documentation

## 2017-01-14 DIAGNOSIS — E871 Hypo-osmolality and hyponatremia: Secondary | ICD-10-CM | POA: Insufficient documentation

## 2017-01-14 DIAGNOSIS — I509 Heart failure, unspecified: Secondary | ICD-10-CM | POA: Diagnosis not present

## 2017-01-14 DIAGNOSIS — N189 Chronic kidney disease, unspecified: Secondary | ICD-10-CM | POA: Insufficient documentation

## 2017-01-14 DIAGNOSIS — Z950 Presence of cardiac pacemaker: Secondary | ICD-10-CM | POA: Diagnosis present

## 2017-01-14 DIAGNOSIS — M1990 Unspecified osteoarthritis, unspecified site: Secondary | ICD-10-CM | POA: Diagnosis not present

## 2017-01-14 DIAGNOSIS — N3941 Urge incontinence: Secondary | ICD-10-CM | POA: Insufficient documentation

## 2017-01-14 DIAGNOSIS — I481 Persistent atrial fibrillation: Secondary | ICD-10-CM | POA: Diagnosis not present

## 2017-01-14 DIAGNOSIS — Z45010 Encounter for checking and testing of cardiac pacemaker pulse generator [battery]: Secondary | ICD-10-CM | POA: Diagnosis not present

## 2017-01-14 DIAGNOSIS — I442 Atrioventricular block, complete: Secondary | ICD-10-CM | POA: Diagnosis not present

## 2017-01-14 DIAGNOSIS — I130 Hypertensive heart and chronic kidney disease with heart failure and stage 1 through stage 4 chronic kidney disease, or unspecified chronic kidney disease: Secondary | ICD-10-CM | POA: Insufficient documentation

## 2017-01-14 DIAGNOSIS — K219 Gastro-esophageal reflux disease without esophagitis: Secondary | ICD-10-CM | POA: Insufficient documentation

## 2017-01-14 HISTORY — PX: PPM GENERATOR CHANGEOUT: EP1233

## 2017-01-14 LAB — SURGICAL PCR SCREEN
MRSA, PCR: NEGATIVE
Staphylococcus aureus: POSITIVE — AB

## 2017-01-14 SURGERY — PPM GENERATOR CHANGEOUT

## 2017-01-14 MED ORDER — MUPIROCIN 2 % EX OINT
1.0000 "application " | TOPICAL_OINTMENT | Freq: Once | CUTANEOUS | Status: AC
  Administered 2017-01-14: 1 via TOPICAL

## 2017-01-14 MED ORDER — SODIUM CHLORIDE 0.9 % IR SOLN
80.0000 mg | Status: AC
  Administered 2017-01-14: 80 mg

## 2017-01-14 MED ORDER — MUPIROCIN 2 % EX OINT
TOPICAL_OINTMENT | CUTANEOUS | Status: AC
  Administered 2017-01-14: 1 via TOPICAL
  Filled 2017-01-14: qty 22

## 2017-01-14 MED ORDER — FENTANYL CITRATE (PF) 100 MCG/2ML IJ SOLN
INTRAMUSCULAR | Status: AC
  Filled 2017-01-14: qty 2

## 2017-01-14 MED ORDER — LIDOCAINE HCL (PF) 1 % IJ SOLN
INTRAMUSCULAR | Status: DC | PRN
  Administered 2017-01-14: 35 mL via INTRADERMAL

## 2017-01-14 MED ORDER — MIDAZOLAM HCL 5 MG/5ML IJ SOLN
INTRAMUSCULAR | Status: AC
  Filled 2017-01-14: qty 5

## 2017-01-14 MED ORDER — SODIUM CHLORIDE 0.9 % IR SOLN
Status: AC
  Filled 2017-01-14: qty 2

## 2017-01-14 MED ORDER — LIDOCAINE HCL (PF) 1 % IJ SOLN
INTRAMUSCULAR | Status: AC
  Filled 2017-01-14: qty 30

## 2017-01-14 MED ORDER — ACETAMINOPHEN 325 MG PO TABS
325.0000 mg | ORAL_TABLET | ORAL | Status: DC | PRN
  Filled 2017-01-14: qty 2

## 2017-01-14 MED ORDER — SODIUM CHLORIDE 0.9 % IV SOLN
INTRAVENOUS | Status: DC
  Administered 2017-01-14: 09:00:00 via INTRAVENOUS

## 2017-01-14 MED ORDER — SODIUM CHLORIDE 0.9 % IV SOLN: INTRAVENOUS | Status: AC

## 2017-01-14 MED ORDER — CHLORHEXIDINE GLUCONATE 4 % EX LIQD: 60.0000 mL | Freq: Once | CUTANEOUS | Status: DC

## 2017-01-14 MED ORDER — CEFAZOLIN SODIUM-DEXTROSE 2-4 GM/100ML-% IV SOLN
INTRAVENOUS | Status: AC
  Filled 2017-01-14: qty 100

## 2017-01-14 MED ORDER — CEFAZOLIN SODIUM-DEXTROSE 2-4 GM/100ML-% IV SOLN
2.0000 g | INTRAVENOUS | Status: AC
  Administered 2017-01-14: 2 g via INTRAVENOUS

## 2017-01-14 MED ORDER — ONDANSETRON HCL 4 MG/2ML IJ SOLN
INTRAMUSCULAR | Status: AC
  Administered 2017-01-14: 4 mg via INTRAVENOUS
  Filled 2017-01-14: qty 2

## 2017-01-14 MED ORDER — ONDANSETRON HCL 4 MG/2ML IJ SOLN
4.0000 mg | Freq: Four times a day (QID) | INTRAMUSCULAR | Status: DC | PRN
  Administered 2017-01-14: 4 mg via INTRAVENOUS

## 2017-01-14 SURGICAL SUPPLY — 8 items
CABLE SURGICAL S-101-97-12 (CABLE) ×3 IMPLANT
DEVICE DISSECT PLASMABLAD 3.0S (MISCELLANEOUS) ×1 IMPLANT
HEMOSTAT SURGICEL 2X4 FIBR (HEMOSTASIS) ×3 IMPLANT
PACEMAKER ASSURITY DR-RF (Pacemaker) ×3 IMPLANT
PAD DEFIB LIFELINK (PAD) ×3 IMPLANT
PLASMABLADE 3.0S (MISCELLANEOUS) ×3
TOOL LEAD REMOVAL 4080 (MISCELLANEOUS) ×3 IMPLANT
TRAY PACEMAKER INSERTION (PACKS) ×3 IMPLANT

## 2017-01-14 NOTE — H&P (View-Only) (Signed)
Patient Care Team: Leone Haven, MD as PCP - General (Family Medicine)   HPI  Darlene Yoder is a 83 y.o. female Seen in follow-up for pacemaker implantation remotely. Her device is rapidly approaching ERI She is mostly wheel chair bound but mentally active and sharp   She has a history of persistent atrial fibrillation for which she is anticoagulated with apixoban.  Thromboembolic risk profile notable for age-38 gender-1 hypertension-1  Records and Results Reviewed   Past Medical History:  Diagnosis Date  . Arthritis   . Atrial fibrillation ([place])   . Carcinoma of the skin, basal cell   . CHF (congestive heart failure) ([place])   . Chickenpox   . CKD (chronic kidney disease)   . GERD (gastroesophageal reflux disease)   . Hypertension   . Low sodium levels   . Pacemaker   . Urge incontinence   . UTI (lower urinary tract infection)     Past Surgical History:  Procedure Laterality Date  . ABDOMINAL HYSTERECTOMY    . APPENDECTOMY    . BASAL CELL CARCINOMA EXCISION Left    cheek  . broken pelvis    . BUNIONECTOMY Bilateral   . CARDIAC CATHETERIZATION    . CORONARY ANGIOGRAM  2015   [place]   . CORONARY ANGIOGRAM  2015  . HEMORRHOID SURGERY    . PACEMAKER INSERTION  12/25/2008   [employer] JUDE pacemaker DRRF 2210 Serial [id number]  . PACEMAKER PLACEMENT  2005  . SKIN SURGERY     removal of a mass on cheek  . SMALL INTESTINE SURGERY  2016  . TONSILLECTOMY      Current Outpatient Prescriptions  Medication Sig Dispense Refill  . Calcium 600-200 MG-UNIT tablet Take 1 tablet by mouth daily.    Marland Kitchen ELIQUIS 2.5 MG TABS tablet TAKE ONE TABLET BY MOUTH TWICE DAILY 60 tablet 5  . HYDROcodone-acetaminophen (NORCO) 7.5-325 MG tablet Limit one half to one tab by mouth per day or 2-3 times per day if tolerated 60 tablet 0  . metoprolol succinate (TOPROL-XL) 50 MG 24 hr tablet TAKE 1 TABLET (50 MG TOTAL) BY MOUTH DAILY. TAKE WITH OR IMMEDIATELY FOLLOWING A MEAL. 90 tablet 0  .  Multiple Vitamins-Minerals (CENTRUM SILVER PO) Take by mouth.    . ondansetron (ZOFRAN) 4 MG tablet Take 1 tablet (4 mg total) by mouth every 6 (six) hours as needed for nausea. 20 tablet 0  . oxybutynin (DITROPAN) 5 MG tablet Take 1 tablet (5 mg total) by mouth 2 (two) times daily. 60 tablet 11  . pantoprazole (PROTONIX) 40 MG tablet TAKE 1 TABLET EVERY DAY 30 tablet 0  . polyethylene glycol powder (GLYCOLAX/MIRALAX) powder TAKE 17 G BY MOUTH 2 (TWO) TIMES DAILY AS NEEDED. 527 g 1  . traZODone (DESYREL) 50 MG tablet TAKE 1 TABLET (50 MG TOTAL) BY MOUTH AT BEDTIME. 90 tablet 1   No current facility-administered medications for this visit.     Allergies  Allergen Reactions  . Tetanus Toxoids       Review of Systems negative except from HPI and PMH  Physical Exam BP 100/80 (BP Location: Left Arm, Patient Position: Sitting, Cuff Size: Normal)   Pulse 70   Ht 5\' 1"  (1.549 m)   Wt 118 lb 8 oz (53.8 kg)   BMI 22.39 kg/m  Well developed and Cachectic age appearing Caucasian female sitting in a wheelchair in no acute distress HENT normal E scleral and icterus clear Neck Supple JVP flat;  carotids brisk and full Clear to ausculation Device pocket well healed; without hematoma or erythema.  There is no tethering there is significant migration of about 4-5 inches.  Regular rate and rhythm, no murmurs gallops or rub Soft with active bowel sounds No clubbing cyanosis  Edema Alert and oriented, grossly normal motor and sensory function Skin Warm and Dry  ECG demonstrates atrial fibrillation with ventricular pacing  Assessment and  Plan   Complete heart block  Persistent Atrial fibrillation  Pacemaker St Judes  Hyponatremia  Device is nearly at ERI. Generator replacement will be necessary.  We would also anticipate device repositioning to a more cephalad location with care not to affect to much the hairpin turn noted in the anterior axillary region  We have reviewed the  benefits and risks of generator replacement.  These include but are not limited to lead fracture and infection.  The patient understands, agrees and is willing to proceed.  We will hold apixoban for 2 doses     Current medicines are reviewed at length with the patient today .  The patient does not  have concerns regarding medicines.

## 2017-01-14 NOTE — Discharge Instructions (Signed)
Pacemaker Battery Change, Care After Refer to this sheet in the next few weeks. These instructions provide you with information on caring for yourself after your procedure. Your health care provider may also give you more specific instructions. Your treatment has been planned according to current medical practices, but problems sometimes occur. Call your health care provider if you have any problems or questions after your procedure. WHAT TO EXPECT AFTER THE PROCEDURE After your procedure, it is typical to have the following sensations:  Soreness at the pacemaker site. HOME CARE INSTRUCTIONS   Keep the incision clean and dry.  Unless advised otherwise, you may shower beginning 48 hours after your procedure.  For the first week after the replacement, avoid stretching motions that pull at the incision site, and avoid heavy exercise with the arm that is on the same side as the incision.  Take medicines only as directed by your health care provider.  Keep all follow-up visits as directed by your health care provider. SEEK MEDICAL CARE IF:   You have pain at the incision site that is not relieved by over-the-counter or prescription medicine.  There is drainage or pus from the incision site.  There is swelling larger than a lime at the incision site.  You develop red streaking that extends above or below the incision site.  You feel brief, intermittent palpitations, light-headedness, or any symptoms that you feel might be related to your heart. SEEK IMMEDIATE MEDICAL CARE IF:   You experience chest pain that is different than the pain at the pacemaker site.  You experience shortness of breath.  You have palpitations or irregular heartbeat.  You have light-headedness that does not go away quickly.  You faint.  You have pain that gets worse and is not relieved by medicine. This information is not intended to replace advice given to you by your health care provider. Make sure you  discuss any questions you have with your health care provider. Document Released: 08/31/2013 Document Revised: 12/01/2014 Document Reviewed: 08/31/2013 Elsevier Interactive Patient Education  2017 Elsevier Inc. Mupirocin nasal ointment What is this medicine? MUPIROCIN CALCIUM (myoo PEER oh sin KAL see um) is an antibiotic. It is used inside the nose to treat infections that are caused by certain bacteria. This helps prevent the spread of infection to patients and health care workers during outbreaks at institutions. This medicine may be used for other purposes; ask your health care provider or pharmacist if you have questions. COMMON BRAND NAME(S): Bactroban What should I tell my health care provider before I take this medicine? They need to know if you have any of these conditions: -an unusual or allergic reaction to mupirocin, other medicines, foods, dyes, or preservatives -pregnant or trying to get pregnant -breast-feeding How should I use this medicine? This medicine is only for use inside the nose. Follow the directions on the prescription label. Wash your hands before and after use. Squeeze half the contents of a single-use tube into one nostril, then squeeze the other half into the other nostril. Press the sides of your nose together and gently massage after application to spread the ointment throughout the nostrils. Do not use your medicine more often than directed. Finish the full course of medicine prescribed by your doctor or health care professional even if you think your condition is better. Talk to your pediatrician regarding the use of this medicine in children. Special care may be needed. Overdosage: If you think you have taken too much of this medicine contact  a poison control center or emergency room at once. NOTE: This medicine is only for you. Do not share this medicine with others. What if I miss a dose? If you miss a dose, take it as soon as you can. If it is almost time for  your next dose, take only that dose. Do not take double or extra doses. What may interact with this medicine? Interactions are not expected. Do not use any other nose products without telling your doctor or health care professional. This list may not describe all possible interactions. Give your health care provider a list of all the medicines, herbs, non-prescription drugs, or dietary supplements you use. Also tell them if you smoke, drink alcohol, or use illegal drugs. Some items may interact with your medicine. What should I watch for while using this medicine? If your nose is severely irritated, burning or stinging from use of this medicine, stop using it and contact your doctor or health care professional. Do not get this medicine in your eyes. If you do, rinse out with plenty of cool tap water. What side effects may I notice from receiving this medicine? Side effects that you should report to your doctor or health care professional as soon as possible: -severe irritation, burning, stinging, or pain Side effects that usually do not require medical attention (report to your doctor or health care professional if they continue or are bothersome): -altered taste -cough -headache -skin itching -sore throat -stuffy or runny nose This list may not describe all possible side effects. Call your doctor for medical advice about side effects. You may report side effects to FDA at 1-800-FDA-1088. Where should I keep my medicine? Keep out of the reach of children. Store at room temperature between 15 and 30 degrees C (59 and 86 degrees F). Do not refrigerate. One tube of ointment is for single use in both nostrils. Throw away after use. NOTE: This sheet is a summary. It may not cover all possible information. If you have questions about this medicine, talk to your doctor, pharmacist, or health care provider.  2017 Elsevier/Gold Standard (2008-05-29 14:36:10)

## 2017-01-14 NOTE — Interval H&P Note (Signed)
History and Physical Interval Note:  01/14/2017 8:52 AM  Darlene Yoder  has presented today for surgery, with the diagnosis of hb - eri  The various methods of treatment have been discussed with the patient and family. After consideration of risks, benefits and other options for treatment, the patient has consented to  Procedure(s): PPM Generator Changeout (N/A) as a surgical intervention .  The patient's history has been reviewed, patient examined, no change in status, stable for surgery.  I have reviewed the patient's chart and labs.  Questions were answered to the patient's satisfaction.     Virl Axe  Questions answered Will move device cephalad

## 2017-01-15 MED FILL — Fentanyl Citrate Preservative Free (PF) Inj 100 MCG/2ML: INTRAMUSCULAR | Qty: 2 | Status: AC

## 2017-01-15 MED FILL — Midazolam HCl Inj 5 MG/5ML (Base Equivalent): INTRAMUSCULAR | Qty: 5 | Status: AC

## 2017-01-19 ENCOUNTER — Other Ambulatory Visit: Payer: Self-pay | Admitting: Family Medicine

## 2017-01-20 ENCOUNTER — Other Ambulatory Visit: Payer: Self-pay | Admitting: Family Medicine

## 2017-01-26 DIAGNOSIS — M47812 Spondylosis without myelopathy or radiculopathy, cervical region: Secondary | ICD-10-CM | POA: Diagnosis not present

## 2017-01-26 DIAGNOSIS — M5416 Radiculopathy, lumbar region: Secondary | ICD-10-CM | POA: Diagnosis not present

## 2017-01-26 DIAGNOSIS — M5412 Radiculopathy, cervical region: Secondary | ICD-10-CM | POA: Diagnosis not present

## 2017-01-26 DIAGNOSIS — M47817 Spondylosis without myelopathy or radiculopathy, lumbosacral region: Secondary | ICD-10-CM | POA: Diagnosis not present

## 2017-01-28 ENCOUNTER — Ambulatory Visit (INDEPENDENT_AMBULATORY_CARE_PROVIDER_SITE_OTHER): Payer: PPO | Admitting: *Deleted

## 2017-01-28 DIAGNOSIS — I4891 Unspecified atrial fibrillation: Secondary | ICD-10-CM

## 2017-01-28 NOTE — Progress Notes (Signed)
Wound check appointment. Dermabond removed. Wound without redness or edema. Incision edges approximated, wound well healed. Normal device function. Thresholds, sensing, and impedances consistent with implant measurements. Device programmed at chronic values s/p gen change. Histogram distribution appropriate for patient and level of activity. AT/AF burden >99% + eliquis. ROV with SK scheduled 03/2017

## 2017-02-09 DIAGNOSIS — E871 Hypo-osmolality and hyponatremia: Secondary | ICD-10-CM | POA: Diagnosis not present

## 2017-02-13 DIAGNOSIS — E871 Hypo-osmolality and hyponatremia: Secondary | ICD-10-CM | POA: Diagnosis not present

## 2017-02-19 ENCOUNTER — Other Ambulatory Visit: Payer: Self-pay | Admitting: Family Medicine

## 2017-02-24 DIAGNOSIS — G894 Chronic pain syndrome: Secondary | ICD-10-CM | POA: Diagnosis not present

## 2017-02-24 DIAGNOSIS — M5416 Radiculopathy, lumbar region: Secondary | ICD-10-CM | POA: Diagnosis not present

## 2017-02-24 DIAGNOSIS — M5137 Other intervertebral disc degeneration, lumbosacral region: Secondary | ICD-10-CM | POA: Diagnosis not present

## 2017-02-24 DIAGNOSIS — M5136 Other intervertebral disc degeneration, lumbar region: Secondary | ICD-10-CM | POA: Diagnosis not present

## 2017-02-24 DIAGNOSIS — M5412 Radiculopathy, cervical region: Secondary | ICD-10-CM | POA: Diagnosis not present

## 2017-02-24 DIAGNOSIS — M545 Low back pain: Secondary | ICD-10-CM | POA: Diagnosis not present

## 2017-02-24 DIAGNOSIS — Z79891 Long term (current) use of opiate analgesic: Secondary | ICD-10-CM | POA: Diagnosis not present

## 2017-02-24 DIAGNOSIS — G580 Intercostal neuropathy: Secondary | ICD-10-CM | POA: Diagnosis not present

## 2017-02-24 DIAGNOSIS — M47812 Spondylosis without myelopathy or radiculopathy, cervical region: Secondary | ICD-10-CM | POA: Diagnosis not present

## 2017-02-24 DIAGNOSIS — M5134 Other intervertebral disc degeneration, thoracic region: Secondary | ICD-10-CM | POA: Diagnosis not present

## 2017-02-24 DIAGNOSIS — M5441 Lumbago with sciatica, right side: Secondary | ICD-10-CM | POA: Diagnosis not present

## 2017-02-24 DIAGNOSIS — M47817 Spondylosis without myelopathy or radiculopathy, lumbosacral region: Secondary | ICD-10-CM | POA: Diagnosis not present

## 2017-02-25 ENCOUNTER — Ambulatory Visit (INDEPENDENT_AMBULATORY_CARE_PROVIDER_SITE_OTHER): Payer: PPO

## 2017-02-25 VITALS — BP 136/78 | HR 76 | Temp 98.1°F | Resp 14 | Ht 60.0 in | Wt 117.4 lb

## 2017-02-25 DIAGNOSIS — Z0000 Encounter for general adult medical examination without abnormal findings: Secondary | ICD-10-CM

## 2017-02-25 NOTE — Progress Notes (Signed)
Subjective:   Darlene Yoder is a 83 y.o. female who presents for Medicare Annual (Subsequent) preventive examination.  Review of Systems:  No ROS.  Medicare Wellness Visit. Cardiac Risk Factors include: advanced age (>65men, >21 women)     Objective:     Vitals: BP 136/78 (BP Location: Left Arm, Patient Position: Sitting, Cuff Size: Normal)   Pulse 76   Temp 98.1 F (36.7 C) (Oral)   Resp 14   Ht 5' (1.524 m)   Wt 117 lb 6.4 oz (53.3 kg)   SpO2 97%   BMI 22.93 kg/m   Body mass index is 22.93 kg/m.   Tobacco History  Smoking Status  . Never Smoker  Smokeless Tobacco  . Never Used     Counseling given: Not Answered   Past Medical History:  Diagnosis Date  . Arthritis   . Atrial fibrillation ([place])   . Carcinoma of the skin, basal cell   . CHF (congestive heart failure) ([place])   . Chickenpox   . CKD (chronic kidney disease)   . GERD (gastroesophageal reflux disease)   . Hypertension   . Low sodium levels   . Pacemaker   . Urge incontinence   . UTI (lower urinary tract infection)    Past Surgical History:  Procedure Laterality Date  . ABDOMINAL HYSTERECTOMY    . APPENDECTOMY    . BASAL CELL CARCINOMA EXCISION Left    cheek  . broken pelvis    . BUNIONECTOMY Bilateral   . CARDIAC CATHETERIZATION    . CORONARY ANGIOGRAM  2015   [place]   . CORONARY ANGIOGRAM  2015  . HEMORRHOID SURGERY    . PACEMAKER INSERTION  12/25/2008   [employer] pacemaker DRRF 2210 Serial [id number]  . PACEMAKER PLACEMENT  2005  . PPM GENERATOR CHANGEOUT N/A 01/14/2017   Procedure: PPM Generator Changeout;  Surgeon: Deboraha Sprang, MD;  Location: [facility];  Service: Cardiovascular;  Laterality: N/A;  . SKIN SURGERY     removal of a mass on cheek  . SMALL INTESTINE SURGERY  2016  . TONSILLECTOMY     Family History  Problem Relation Age of Onset  . Heart disease Father   . Stroke Father   . Hypertension Father    History  Sexual Activity  . Sexual activity: No     Outpatient Encounter Prescriptions as of 02/25/2017  Medication Sig  . beta carotene w/minerals (OCUVITE) tablet Take 1 tablet by mouth daily.  . Calcium 600-200 MG-UNIT tablet Take 1 tablet by mouth daily.  Marland Kitchen ELIQUIS 2.5 MG TABS tablet TAKE ONE TABLET BY MOUTH TWICE DAILY  . HYDROcodone-acetaminophen (NORCO) 7.5-325 MG tablet Limit one half to one tab by mouth per day or 2-3 times per day if tolerated  . metoprolol succinate (TOPROL-XL) 50 MG 24 hr tablet TAKE 1 TABLET (50 MG TOTAL) BY MOUTH DAILY. TAKE WITH OR IMMEDIATELY FOLLOWING A MEAL.  . Multiple Vitamins-Minerals (CENTRUM SILVER PO) Take 1 tablet by mouth daily.   . ondansetron (ZOFRAN) 4 MG tablet Take 1 tablet (4 mg total) by mouth every 6 (six) hours as needed for nausea.  Marland Kitchen oxybutynin (DITROPAN) 5 MG tablet TAKE 1 TABLET (5 MG TOTAL) BY MOUTH 2 (TWO) TIMES DAILY.  . pantoprazole (PROTONIX) 40 MG tablet TAKE 1 TABLET EVERY DAY  . polyethylene glycol powder (GLYCOLAX/MIRALAX) powder TAKE 17 G BY MOUTH 2 (TWO) TIMES DAILY AS NEEDED. (Patient taking differently: TAKE 17 G BY MOUTH EVERY MORNING)  .  Polyvinyl Alcohol-Povidone (REFRESH OP) Apply 1 drop to eye 2 (two) times daily as needed (dry eyes).  . sodium chloride (OCEAN) 0.65 % SOLN nasal spray Place 1 spray into both nostrils 2 (two) times daily as needed for congestion.  . traZODone (DESYREL) 50 MG tablet Take 1 tablet (50 mg total) by mouth at bedtime.   No facility-administered encounter medications on file as of 02/25/2017.     Activities of Daily Living In your present state of health, do you have any difficulty performing the following activities: 02/25/2017 01/14/2017  Hearing? Y N  Vision? N N  Difficulty concentrating or making decisions? N N  Walking or climbing stairs? Y Y  Dressing or bathing? Y Y  Doing errands, shopping? Y -  Conservation officer, nature and eating ? Y -  Using the Toilet? N -  In the past six months, have you accidently leaked urine? N -  Do you have  problems with loss of bowel control? N -  Managing your Medications? N -  Managing your Finances? Y -  Housekeeping or managing your Housekeeping? Y -  Some recent data might be hidden    Patient Care Team: Leone Haven, MD as PCP - General (Family Medicine)    Assessment:    This is a routine wellness examination for Analya. The goal of the wellness visit is to assist the patient how to close the gaps in care and create a preventative care plan for the patient.   Taking calcium as appropriate/Osteoporosis risk reviewed.  Medications reviewed; taking without issues or barriers.  Safety issues reviewed; smoke detectors in the home. No firearms in the home.  Wears seatbelts when driving or riding with others. Patient does wear sunscreen or protective clothing when in direct sunlight. No violence in the home.  Patient is alert, normal appearance, oriented to person/place/and time. Correctly identified the president of the [place], recall of 2/3 words, and performing simple calculations.  Patient displays appropriate judgement and can read correct time from watch face.  No new identified risk were noted.  No failures at ADL's or IADL's. Ambulates with walker inside the home and wheelchair outside of the home. Family assists as needed with bathing, meal prep, home/financial management  BMI- discussed the importance of a healthy diet, water intake and exercise. Educational material provided.   Diet: Soft foods  Daily fluid intake: Fluid restriction due to low sodium.  HTN- followed by PCP.  Dental- wears dentures.  Next appointment scheduled 03/10/17 Dr.Philips.  Eye- Visual acuity not assessed per patient preference since they have regular follow up with the ophthalmologist.  Wears corrective lenses.  Sleep patterns- Sleeps 5 hours at night.  Wakes feeling rested.  Naps  TDAP, Prevnar 13/PNA 23 vaccine, Dexa Scan deferred per patient preference.  She is uncertain of last  administration dates due to the recent move.  Educational material provided.  She plans to follow up once dates have been resolved.  Patient Concerns: None at this time. Follow up with PCP as needed.  Exercise Activities and Dietary recommendations Current Exercise Habits: Home exercise routine, Type of exercise: walking, Intensity: Mild  Goals    . Increase physical activity          Stay active. Walk and do chair exercises, as demonstrated.  Increase activity as tolerated.      Fall Risk Fall Risk  02/25/2017 10/20/2016 06/02/2016 05/06/2016 04/08/2016  Falls in the past year? No No No No No  Number falls in past  yr: - - - - -  Injury with Fall? - - - - -  Risk Factor Category  - - - - -  Risk for fall due to : - - - - -  Follow up - - - - -   Depression Screen PHQ 2/9 Scores 02/25/2017 10/20/2016 06/02/2016 05/06/2016  PHQ - 2 Score 0 0 0 0  Exception Documentation - - - -     Cognitive Function     6CIT Screen 02/25/2017  What Year? 0 points  What month? 0 points  What time? 0 points  Count back from 20 0 points  Months in reverse 0 points  Repeat phrase 0 points  Total Score 0    Immunization History  Administered Date(s) Administered  . Influenza, High Dose Seasonal PF 08/27/2016   Screening Tests Health Maintenance  Topic Date Due  . DEXA SCAN  07/03/1989  . PNA vac Low Risk Adult (2 of 2 - PPSV23) 09/22/2016  . TETANUS/TDAP  06/05/2017 (Originally 07/04/1941)  . INFLUENZA VACCINE  06/24/2017      Plan:    End of life planning; Advance aging; Advanced directives discussed. Copy of current HCPOA/Living Will on file.    Medicare Attestation I have personally reviewed: The patient's medical and social history Their use of alcohol, tobacco or illicit drugs Their current medications and supplements The patient's functional ability including ADLs,fall risks, home safety risks, cognitive, and hearing and visual impairment Diet and physical activities Evidence  for depression   The patient's weight, height, BMI, and visual acuity have been recorded in the chart.  I have made referrals and provided education to the patient based on review of the above and I have provided the patient with a written personalized care plan for preventive services.    During the course of the visit the patient was educated and counseled about the following appropriate screening and preventive services:   Vaccines to include Pneumoccal, Influenza, Hepatitis B, Td, Zostavax, HCV  Colorectal cancer screening  Bone density screening  Glaucoma screening-annual eye exam  Mammography/PAP-aged out/UTD  Nutrition counseling   Patient Instructions (the written plan) was given to the patient.   Varney Biles, LPN  01/24/1223

## 2017-02-25 NOTE — Patient Instructions (Addendum)
  Ms. Virrueta , Thank you for taking time to come for your Medicare Wellness Visit. I appreciate your ongoing commitment to your health goals. Please review the following plan we discussed and let me know if I can assist you in the future.   Follow up with Dr. Caryl Bis as needed.    Have a great day!  These are the goals we discussed: Goals    . Increase physical activity          Stay active. Walk and do chair exercises, as demonstrated.  Increase activity as tolerated.       This is a list of the screening recommended for you and due dates:  Health Maintenance  Topic Date Due  . DEXA scan (bone density measurement)  07/03/1989  . Pneumonia vaccines (2 of 2 - PPSV23) 09/22/2016  . Tetanus Vaccine  06/05/2017*  . Flu Shot  06/24/2017  *Topic was postponed. The date shown is not the original due date.

## 2017-03-06 NOTE — Progress Notes (Signed)
I have reviewed the above note and agree.  Sheronda Parran, M.D.  

## 2017-03-11 ENCOUNTER — Other Ambulatory Visit: Payer: Self-pay | Admitting: Family Medicine

## 2017-03-13 ENCOUNTER — Other Ambulatory Visit: Payer: Self-pay | Admitting: Internal Medicine

## 2017-03-20 ENCOUNTER — Encounter: Payer: Self-pay | Admitting: Family Medicine

## 2017-03-20 ENCOUNTER — Ambulatory Visit (INDEPENDENT_AMBULATORY_CARE_PROVIDER_SITE_OTHER): Payer: PPO | Admitting: Family Medicine

## 2017-03-20 DIAGNOSIS — R194 Change in bowel habit: Secondary | ICD-10-CM | POA: Diagnosis not present

## 2017-03-20 DIAGNOSIS — I4819 Other persistent atrial fibrillation: Secondary | ICD-10-CM

## 2017-03-20 DIAGNOSIS — E871 Hypo-osmolality and hyponatremia: Secondary | ICD-10-CM | POA: Diagnosis not present

## 2017-03-20 DIAGNOSIS — I10 Essential (primary) hypertension: Secondary | ICD-10-CM | POA: Diagnosis not present

## 2017-03-20 DIAGNOSIS — L853 Xerosis cutis: Secondary | ICD-10-CM

## 2017-03-20 DIAGNOSIS — I481 Persistent atrial fibrillation: Secondary | ICD-10-CM | POA: Diagnosis not present

## 2017-03-20 DIAGNOSIS — B0229 Other postherpetic nervous system involvement: Secondary | ICD-10-CM | POA: Diagnosis not present

## 2017-03-20 NOTE — Assessment & Plan Note (Signed)
Dry skin on posterior heels have been improving. Discussed continuing moisturizing cream on her feet. Advised to monitor for any signs of infection.

## 2017-03-20 NOTE — Progress Notes (Signed)
  Tommi Rumps, MD Phone: [phone]  Darlene Yoder is a 83 y.o. female who presents today for follow-up.  Patient reports she had her pacemaker replaced since we last saw each other. Notes it is healing well. Notes no palpitations.  Hypertension: Notes it's running in the 130s over 70s at home. No chest pain, shortness breath, or edema. She's on metoprolol.  Has had low sodiums in the past. She is fluid restricting herself. No nausea or vomiting. No dizziness.  Notes her right heels have been dry and do crack at times. It does cause some discomfort. This has improved as they have been using lotion on her heels consistently.  Patient notes a history of shingles in her right chest. When she had this many years ago she would get a "zing" in that area that would last very briefly go away on its own. She notes occasionally she will get a similar sensation in that area. Has not developed any rash. Does not take any medication for this. It is not persistent.  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 She notes no abdominal pain. She has a daily bowel movement. She does note since the time of the surgery her stools have been thinner than they had previously. About the size of your finger.  PMH: nonsmoker.   ROS see history of present illness  Objective  Physical Exam Vitals:   03/20/17 1457  BP: (!) 162/84  Pulse: 71  Temp: 98.5 F (36.9 C)    BP Readings from Last 3 Encounters:  03/20/17 (!) 162/84  02/25/17 136/78  01/14/17 135/73   Wt Readings from Last 3 Encounters:  03/20/17 115 lb 12.8 oz (52.5 kg)  02/25/17 117 lb 6.4 oz (53.3 kg)  01/14/17 115 lb (52.2 kg)    Physical Exam  Constitutional: No distress.  Cardiovascular: Normal rate, regular rhythm and normal heart sounds.   Well-healing pacemaker site, no rash on right upper chest  Pulmonary/Chest: Effort normal and breath sounds  normal.  Abdominal: Soft. She exhibits no distension. There is no tenderness. There is no rebound and no guarding.  Neurological: She is alert.  Skin: She is not diaphoretic.     Assessment/Plan: Please see individual problem list.  Essential hypertension Blood pressure is at goal at home. Slightly elevated today. She'll continue to monitor.  Atrial fibrillation (HCC) Asymptomatic. Pacemaker recently replaced. She'll monitor.  Dry skin Dry skin on posterior heels have been improving. Discussed continuing moisturizing cream on her feet. Advised to monitor for any signs of infection.  Hyponatremia Sodium has been relatively stable recently. She'll continue to monitor for symptoms.  Postherpetic neuralgia Suspect the "zing" she occasionally gets is related to postherpetic neuralgia given that it occurs in the same place as her prior shingles. Discussed that  given that it is infrequent the benefits of the medication would not outweigh the risks to treat this. She'll continue to monitor.   Bowel habit changes Patient reports smaller size stools than previously. This followed having surgery on her bowels. Discussed that typically we have people see GI for evaluation to ensure that there is no significant cause to this. She declines this and would like to continue to monitor.   Tommi Rumps, MD [place]

## 2017-03-20 NOTE — Assessment & Plan Note (Signed)
Asymptomatic. Pacemaker recently replaced. She'll monitor.

## 2017-03-20 NOTE — Assessment & Plan Note (Signed)
Suspect the "zing" she occasionally gets is related to postherpetic neuralgia given that it occurs in the same place as her prior shingles. Discussed that  given that it is infrequent the benefits of the medication would not outweigh the risks to treat this. She'll continue to monitor.

## 2017-03-20 NOTE — Assessment & Plan Note (Signed)
Patient reports smaller size stools than previously. This followed having surgery on her bowels. Discussed that typically we have people see GI for evaluation to ensure that there is no significant cause to this. She declines this and would like to continue to monitor.

## 2017-03-20 NOTE — Patient Instructions (Signed)
Nice to see you. Please monitor your heels. You should use lotion on them. If you develop any further cracks or signs of infection please let us know. Please monitor your stools and if they change or he develop any abdominal pain, vomiting, blood in her stool, or new symptoms please get evaluated. Please monitor the zing you get. If he developed rash in this area please let us know. If it becomes more bothersome please let us know as well.

## 2017-03-20 NOTE — Assessment & Plan Note (Signed)
Sodium has been relatively stable recently. She'll continue to monitor for symptoms.

## 2017-03-20 NOTE — Assessment & Plan Note (Signed)
Blood pressure is at goal at home. Slightly elevated today. She'll continue to monitor.

## 2017-03-23 ENCOUNTER — Other Ambulatory Visit: Payer: Self-pay | Admitting: Family Medicine

## 2017-03-23 DIAGNOSIS — M5416 Radiculopathy, lumbar region: Secondary | ICD-10-CM | POA: Diagnosis not present

## 2017-03-23 DIAGNOSIS — M47817 Spondylosis without myelopathy or radiculopathy, lumbosacral region: Secondary | ICD-10-CM | POA: Diagnosis not present

## 2017-03-23 DIAGNOSIS — M5134 Other intervertebral disc degeneration, thoracic region: Secondary | ICD-10-CM | POA: Diagnosis not present

## 2017-03-23 DIAGNOSIS — Z79891 Long term (current) use of opiate analgesic: Secondary | ICD-10-CM | POA: Diagnosis not present

## 2017-03-23 DIAGNOSIS — G580 Intercostal neuropathy: Secondary | ICD-10-CM | POA: Diagnosis not present

## 2017-03-23 DIAGNOSIS — M545 Low back pain: Secondary | ICD-10-CM | POA: Diagnosis not present

## 2017-03-23 DIAGNOSIS — M5136 Other intervertebral disc degeneration, lumbar region: Secondary | ICD-10-CM | POA: Diagnosis not present

## 2017-03-23 DIAGNOSIS — M5441 Lumbago with sciatica, right side: Secondary | ICD-10-CM | POA: Diagnosis not present

## 2017-03-23 DIAGNOSIS — M5137 Other intervertebral disc degeneration, lumbosacral region: Secondary | ICD-10-CM | POA: Diagnosis not present

## 2017-03-23 DIAGNOSIS — M47812 Spondylosis without myelopathy or radiculopathy, cervical region: Secondary | ICD-10-CM | POA: Diagnosis not present

## 2017-03-23 DIAGNOSIS — G894 Chronic pain syndrome: Secondary | ICD-10-CM | POA: Diagnosis not present

## 2017-03-23 DIAGNOSIS — M5412 Radiculopathy, cervical region: Secondary | ICD-10-CM | POA: Diagnosis not present

## 2017-03-24 ENCOUNTER — Telehealth: Payer: Self-pay | Admitting: *Deleted

## 2017-03-24 NOTE — Telephone Encounter (Signed)
It should be okay for her to take this medication. It may make her drowsy so she should be aware of that.

## 2017-03-24 NOTE — Telephone Encounter (Signed)
Patients niece notified

## 2017-03-24 NOTE — Telephone Encounter (Signed)
Patients niece would like to know if it is ok for patient to take the gabapentin with all of her other medications.

## 2017-03-24 NOTE — Telephone Encounter (Signed)
Noted. Please see if there was anything they needed from [place] regarding this. Thanks.

## 2017-03-24 NOTE — Telephone Encounter (Signed)
Patient was advised Dr. Primus Bravo (pain management)  to take gabapentin for her mouth pain. He had concerns that pt could possibly have nerve pain from the changes in her gums and mouth. Patient has an appt. with the dentist on today. The dentist has applied a third liner in her dentures to help with pain wile eating, this has not worked.  Please Contact [name] (niece) [phone]

## 2017-03-24 NOTE — Telephone Encounter (Signed)
Please advise 

## 2017-04-10 DIAGNOSIS — R1030 Lower abdominal pain, unspecified: Secondary | ICD-10-CM | POA: Diagnosis not present

## 2017-04-10 DIAGNOSIS — R112 Nausea with vomiting, unspecified: Secondary | ICD-10-CM | POA: Diagnosis not present

## 2017-04-11 ENCOUNTER — Emergency Department
Admission: EM | Admit: 2017-04-11 | Discharge: 2017-04-11 | Disposition: A | Discharge status: Home / Self Care | Payer: PPO | Attending: Emergency Medicine | Admitting: Emergency Medicine

## 2017-04-11 ENCOUNTER — Emergency Department: Payer: PPO

## 2017-04-11 ENCOUNTER — Encounter: Payer: Self-pay | Admitting: Emergency Medicine

## 2017-04-11 DIAGNOSIS — Z79899 Other long term (current) drug therapy: Secondary | ICD-10-CM | POA: Diagnosis not present

## 2017-04-11 DIAGNOSIS — R1010 Upper abdominal pain, unspecified: Secondary | ICD-10-CM | POA: Diagnosis not present

## 2017-04-11 DIAGNOSIS — I130 Hypertensive heart and chronic kidney disease with heart failure and stage 1 through stage 4 chronic kidney disease, or unspecified chronic kidney disease: Secondary | ICD-10-CM | POA: Insufficient documentation

## 2017-04-11 DIAGNOSIS — K449 Diaphragmatic hernia without obstruction or gangrene: Secondary | ICD-10-CM

## 2017-04-11 DIAGNOSIS — Z7901 Long term (current) use of anticoagulants: Secondary | ICD-10-CM | POA: Diagnosis not present

## 2017-04-11 DIAGNOSIS — Z950 Presence of cardiac pacemaker: Secondary | ICD-10-CM | POA: Insufficient documentation

## 2017-04-11 DIAGNOSIS — R1013 Epigastric pain: Secondary | ICD-10-CM | POA: Diagnosis not present

## 2017-04-11 DIAGNOSIS — N189 Chronic kidney disease, unspecified: Secondary | ICD-10-CM | POA: Diagnosis not present

## 2017-04-11 DIAGNOSIS — I509 Heart failure, unspecified: Secondary | ICD-10-CM | POA: Diagnosis not present

## 2017-04-11 LAB — COMPREHENSIVE METABOLIC PANEL
ALK PHOS: 59 U/L (ref 38–126)
ALT: 7 U/L — ABNORMAL LOW (ref 14–54)
ANION GAP: 10 (ref 5–15)
AST: 21 U/L (ref 15–41)
Albumin: 4 g/dL (ref 3.5–5.0)
BILIRUBIN TOTAL: 0.4 mg/dL (ref 0.3–1.2)
BUN: 13 mg/dL (ref 6–20)
CALCIUM: 9.7 mg/dL (ref 8.9–10.3)
CO2: 27 mmol/L (ref 22–32)
Chloride: 99 mmol/L — ABNORMAL LOW (ref 101–111)
Creatinine, Ser: 0.93 mg/dL (ref 0.44–1.00)
GFR calc non Af Amer: 52 mL/min — ABNORMAL LOW (ref >60)
GFR, EST AFRICAN AMERICAN: 60 mL/min — AB (ref >60)
Glucose, Bld: 168 mg/dL — ABNORMAL HIGH (ref 65–99)
Potassium: 4.1 mmol/L (ref 3.5–5.1)
SODIUM: 136 mmol/L (ref 135–145)
TOTAL PROTEIN: 7.5 g/dL (ref 6.5–8.1)

## 2017-04-11 LAB — CBC
HCT: 38.4 % (ref 35.0–47.0)
HEMOGLOBIN: 13 g/dL (ref 12.0–16.0)
MCH: 30.2 pg (ref 26.0–34.0)
MCHC: 33.8 g/dL (ref 32.0–36.0)
MCV: 89.4 fL (ref 80.0–100.0)
Platelets: 281 10*3/uL (ref 150–440)
RBC: 4.3 MIL/uL (ref 3.80–5.20)
RDW: 13.7 % (ref 11.5–14.5)
WBC: 9.3 10*3/uL (ref 3.6–11.0)

## 2017-04-11 LAB — TROPONIN I

## 2017-04-11 LAB — LIPASE, BLOOD: Lipase: 18 U/L (ref 11–51)

## 2017-04-11 MED ORDER — HYDROMORPHONE HCL 1 MG/ML IJ SOLN: 0.5000 mg | INTRAMUSCULAR | Status: DC

## 2017-04-11 MED ORDER — MORPHINE SULFATE (PF) 2 MG/ML IV SOLN
INTRAVENOUS | Status: AC
  Administered 2017-04-11: 2 mg via INTRAVENOUS
  Filled 2017-04-11: qty 1

## 2017-04-11 MED ORDER — ONDANSETRON HCL 4 MG/2ML IJ SOLN
4.0000 mg | INTRAMUSCULAR | Status: AC
  Administered 2017-04-11: 4 mg via INTRAVENOUS
  Filled 2017-04-11: qty 2

## 2017-04-11 MED ORDER — MORPHINE SULFATE (PF) 2 MG/ML IV SOLN
2.0000 mg | Freq: Once | INTRAVENOUS | Status: AC
  Administered 2017-04-11: 2 mg via INTRAVENOUS

## 2017-04-11 NOTE — ED Triage Notes (Signed)
Pt to [facility] 24 via EMS from home, report epigastric pain and nausea tonight, hx of hiatal hernia and states feels similar.  Pt give zofran IV en route w/ improvement in nausea.  EMS report pt was on gabapentin for pain which helped, but became lethargic so was discontinued.  [facility]atal [facility]nia followed by [facility].  Pt in NAD at this time, resp equal and unlabored, skin warm and dry.

## 2017-04-11 NOTE — ED Notes (Signed)
Pt c/o dry mouth, pt given glycerin swabs and mouth moisterizer

## 2017-04-11 NOTE — ED Provider Notes (Signed)
[facility] Emergency Department Provider Note  ____________________________________________   First MD Initiated Contact with Patient 04/11/17 [phone]     (approximate)  I have reviewed the triage vital signs and the nursing notes.   HISTORY  Chief Complaint Abdominal Pain    HPI Darlene Yoder is a 83 y.o. female with medical issues as listed below but most notable for a large hiatal hernia that has in the past become incarcerated.  She presents for evaluation of relatively acute onset upper abdominal/epigastric pain with some nausea that started during dinner tonight, approximately 8 hours prior to arrival in the emergency department.  She states that it feels similar to prior episodes with her hernia.  She was admitted a couple of months ago to this hospital and evaluated by surgery and referred to [facility] bariatric surgery.  I see a note in the computer that indicates that the bariatric surgeon at [facility] recommended strongly against surgery based on the patient's age and the morbidity and mortality associated with the surgery.  The family was comfortable at that time with the plan for symptomatic treatment.  The patient states that her symptoms began shortly after eating dinner.  She feels like if she could burp she would feel better.  She tried drinking some ginger ale but it did not help and neither did Alka-Seltzer.  He had nausea but no vomiting.  She has not had any diarrhea or constipation.  She has no lower abdominal pain.  She denies fever/chills, shortness of breath, dysuria, and increased urinary frequency.  When she has this pain she frequently feels it radiates throughout her chest but she knows it is from her hiatal hernia.  In spite of her age, she is very sharp and interactive, talkative and making jokes with me and in no distress during my interview with her.  Past Medical History:  Diagnosis Date  . Arthritis   . Atrial fibrillation ([place])   .  Carcinoma of the skin, basal cell   . CHF (congestive heart failure) ([place])   . Chickenpox   . CKD (chronic kidney disease)   . GERD (gastroesophageal reflux disease)   . Hypertension   . Low sodium levels   . Pacemaker   . Urge incontinence   . UTI (lower urinary tract infection)     Patient Active Problem List   Diagnosis Date Noted  . Postherpetic neuralgia 03/20/2017  . Bowel habit changes 03/20/2017  . Complete heart block ([place]) 01/14/2017  . Dysuria 12/24/2016  . DDD (degenerative disc disease), lumbar 07/03/2016  . Facet syndrome, lumbar ([place]) 07/03/2016  . DDD (degenerative disc disease), thoracic 07/03/2016  . Thoracic facet syndrome ([place]) 07/03/2016  . Scoliosis (and kyphoscoliosis), idiopathic 07/03/2016  . Sacroiliac joint dysfunction 07/03/2016  . DDD (degenerative disc disease), cervical 07/03/2016  . DJD (degenerative joint disease) of knee 07/03/2016  . DJD of shoulder 07/03/2016  . GERD (gastroesophageal reflux disease) 03/09/2016  . Dry skin 03/09/2016  . Hyponatremia 01/08/2016  . Presence of permanent cardiac pacemaker 12/07/2015  . Atrial fibrillation ([place]) 12/05/2015  . History of basal cell carcinoma of skin 12/05/2015  . Chronic pain syndrome 12/05/2015  . Congenital blocked tear duct 12/05/2015  . Essential hypertension 12/05/2015  . Urge incontinence 12/05/2015    Past Surgical History:  Procedure Laterality Date  . ABDOMINAL HYSTERECTOMY    . APPENDECTOMY    . BASAL CELL CARCINOMA EXCISION Left    cheek  . broken pelvis    . BUNIONECTOMY  Bilateral   . CARDIAC CATHETERIZATION    . CORONARY ANGIOGRAM  2015   [place]   . CORONARY ANGIOGRAM  2015  . HEMORRHOID SURGERY    . PACEMAKER INSERTION  12/25/2008   [employer] pacemaker DRRF 2210 Serial [id number]  . PACEMAKER PLACEMENT  2005  . PPM GENERATOR CHANGEOUT N/A 01/14/2017   Procedure: PPM Generator Changeout;  Surgeon: Deboraha Sprang, MD;  Location: [place] [facility];  Service: Cardiovascular;   Laterality: N/A;  . SKIN SURGERY     removal of a mass on cheek  . SMALL INTESTINE SURGERY  2016  . TONSILLECTOMY      Prior to Admission medications   Medication Sig Start Date End Date Taking? Authorizing Provider  beta carotene w/minerals (OCUVITE) tablet Take 1 tablet by mouth daily.    [provider]  Calcium 600-200 MG-UNIT tablet Take 1 tablet by mouth daily.    [provider]  ELIQUIS 2.5 MG TABS tablet TAKE ONE TABLET BY MOUTH TWICE DAILY 01/20/17   Leone Haven, MD  HYDROcodone-acetaminophen [facility]) 7.5-325 MG tablet Limit one half to one tab by mouth per day or 2-3 times per day if tolerated 08/04/16   Mohammed Kindle, MD  metoprolol succinate (TOPROL-XL) 50 MG 24 hr tablet TAKE 1 TABLET (50 MG TOTAL) BY MOUTH DAILY. TAKE WITH OR IMMEDIATELY FOLLOWING A MEAL. 02/19/17   Leone Haven, MD  Multiple Vitamins-Minerals (CENTRUM SILVER PO) Take 1 tablet by mouth daily.     [provider]  ondansetron (ZOFRAN) 4 MG tablet Take 1 tablet (4 mg total) by mouth every 6 (six) hours as needed for nausea. 10/10/16   Epifanio Lesches, MD  oxybutynin (DITROPAN) 5 MG tablet TAKE 1 TABLET (5 MG TOTAL) BY MOUTH 2 (TWO) TIMES DAILY. 01/20/17   Leone Haven, MD  pantoprazole (PROTONIX) 40 MG tablet TAKE 1 TABLET EVERY DAY 12/08/16   Leone Haven, MD  polyethylene glycol powder (GLYCOLAX/MIRALAX) powder TAKE 17 G BY MOUTH 2 (TWO) TIMES DAILY AS NEEDED. 03/11/17   Leone Haven, MD  Polyvinyl Alcohol-Povidone (REFRESH OP) Apply 1 drop to eye 2 (two) times daily as needed (dry eyes).    [provider]  sodium chloride (OCEAN) 0.65 % SOLN nasal spray Place 1 spray into both nostrils 2 (two) times daily as needed for congestion.    [provider]  traZODone (DESYREL) 50 MG tablet TAKE 1 TABLET AT BEDTIME 03/24/17   Leone Haven, MD    Allergies Tetanus toxoids  Family History  Problem Relation Age of Onset  . Heart disease  Father   . Stroke Father   . Hypertension Father     Social History Social History  Substance Use Topics  . Smoking status: Never Smoker  . Smokeless tobacco: Never Used  . Alcohol use No    Review of Systems Constitutional: No fever/chills Eyes: No visual changes. ENT: No sore throat. Cardiovascular: Chest pain radiating from her hiatal hernia and epigastrium Respiratory: Denies shortness of breath. Gastrointestinal: Upper abdominal pain with nausea but no vomiting. Genitourinary: Negative for dysuria. Musculoskeletal: Negative for neck pain.  Negative for back pain. Integumentary: Negative for rash. Neurological: Negative for headaches, focal weakness or numbness.   ____________________________________________   PHYSICAL EXAM:  VITAL SIGNS: ED Triage Vitals  Enc Vitals Group     BP 04/11/17 0106 (!) 180/81     Pulse Rate 04/11/17 0106 81     Resp 04/11/17 0106 17  Temp 04/11/17 0106 97.8 F (36.6 C)     Temp Source 04/11/17 0106 Oral     SpO2 04/11/17 0106 99 %     Weight 04/11/17 0106 105 lb (47.6 kg)     Height 04/11/17 0106 5' (1.524 m)     Head Circumference --      Peak Flow --      Pain Score 04/11/17 0105 10     Pain Loc --      Pain Edu? --      Excl. in [place]? --     Constitutional: Alert and oriented. Elderly but generally Well appearing and in no acute distress. Eyes: Conjunctivae are normal.  Cardiovascular: Normal rate, regular rhythm. Good peripheral circulation. Grossly normal heart sounds. Respiratory: Normal respiratory effort.  No retractions. Lungs CTAB. Gastrointestinal: Soft and nontender. No distention.  Musculoskeletal: No lower extremity tenderness nor edema. No gross deformities of extremities. Neurologic:  Alert and oriented 3.  Normal speech and language. No gross focal neurologic deficits are appreciated.  Skin:  Skin is warm, dry and intact. No rash noted. Psychiatric: Mood and affect are normal. Speech and behavior are  normal.  ____________________________________________   LABS (all labs ordered are listed, but only abnormal results are displayed)  Labs Reviewed  COMPREHENSIVE METABOLIC PANEL - Abnormal; Notable for the following:       Result Value   Chloride 99 (*)    Glucose, Bld 168 (*)    ALT 7 (*)    GFR calc non Af Amer 52 (*)    GFR calc Af Amer 60 (*)    All other components within normal limits  LIPASE, BLOOD  CBC  TROPONIN I  URINALYSIS, COMPLETE (UACMP) WITH MICROSCOPIC   ____________________________________________  EKG  ED ECG REPORT I, Haili Donofrio, the attending physician, personally viewed and interpreted this ECG.  Date: 04/11/2017 EKG Time: 1:07 AM Rate: 70 Rhythm: Ventricular paced rhythm QRS Axis: normal Intervals: Paced rhythm ST/T Wave abnormalities: Non-specific ST segment / T-wave changes, but no evidence of acute ischemia. Conduction Disturbances: none Narrative Interpretation: unremarkable  ____________________________________________  RADIOLOGY   Dg Abdomen Acute W/chest  Result Date: 04/11/2017 CLINICAL DATA:  Epigastric pain and nausea EXAM: DG ABDOMEN ACUTE W/ 1V CHEST COMPARISON:  Chest radiograph 10/20/2016 FINDINGS: There is a large hiatal hernia. Cardiomediastinal silhouette is mildly enlarged. There is aortic arch calcification. No pneumothorax. Bilateral blunting of the costophrenic angles is likely chronic. There is a normal amount of stool in the colon. No dilated small bowel is seen. No free intraperitoneal air. IMPRESSION: Large hiatal hernia. No active cardiopulmonary disease or acute abnormality of the abdomen. Aortic atherosclerosis. Electronically Signed   By: Ulyses Jarred M.D.   On: 04/11/2017 02:07    ____________________________________________   PROCEDURES  Critical Care performed: No   Procedure(s) performed:   Procedures   ____________________________________________   INITIAL IMPRESSION / ASSESSMENT AND PLAN / ED  COURSE  Pertinent labs & imaging results that were available during my care of the patient were reviewed by me and considered in my medical decision making (see chart for details).  The patient is well-appearing and in no acute distress.  She was in some moderate distress upon arrival and received morphine 2 mg IV and she says it made her pain go away.  We discussed her radiographic findings and the lack of any acute or emergent results and her reassuring lab work.  She stated "since my pain is gone I would like to go  home."  I think that is appropriate and we gave her a PO challenge with ginger ale and she had no issues.  She and her niece are comfortable with the plan for outpatient follow-up.  I gave my usual and customary return precautions.         ____________________________________________  FINAL CLINICAL IMPRESSION(S) / ED DIAGNOSES  Final diagnoses:  Pain of upper abdomen  Hiatal hernia     MEDICATIONS GIVEN DURING THIS VISIT:  Medications  morphine 2 MG/ML injection 2 mg (2 mg Intravenous Given 04/11/17 0125)  ondansetron (ZOFRAN) injection 4 mg (4 mg Intravenous Given 04/11/17 0154)     NEW OUTPATIENT MEDICATIONS STARTED DURING THIS VISIT:  New Prescriptions   No medications on file    Modified Medications   No medications on file    Discontinued Medications   No medications on file     Note:  This document was prepared using Dragon voice recognition software and may include unintentional dictation errors.    Hinda Kehr, MD 04/11/17 [phone]

## 2017-04-11 NOTE — Discharge Instructions (Signed)
As we discussed, your pain is most likely the result of your hiatal hernia but fortunately the pain has resolved and you are able to eat and drink.  We recommend that you follow-up with your regular doctor at next available opportunity.    Return to the emergency department if you develop new or worsening symptoms that concern you.

## 2017-04-21 ENCOUNTER — Encounter: Payer: Self-pay | Admitting: Internal Medicine

## 2017-04-21 ENCOUNTER — Ambulatory Visit (INDEPENDENT_AMBULATORY_CARE_PROVIDER_SITE_OTHER): Payer: PPO | Admitting: Internal Medicine

## 2017-04-21 VITALS — BP 140/80 | HR 70 | Ht 60.0 in | Wt 116.5 lb

## 2017-04-21 DIAGNOSIS — M5441 Lumbago with sciatica, right side: Secondary | ICD-10-CM | POA: Diagnosis not present

## 2017-04-21 DIAGNOSIS — I4819 Other persistent atrial fibrillation: Secondary | ICD-10-CM

## 2017-04-21 DIAGNOSIS — I481 Persistent atrial fibrillation: Secondary | ICD-10-CM | POA: Diagnosis not present

## 2017-04-21 DIAGNOSIS — I442 Atrioventricular block, complete: Secondary | ICD-10-CM | POA: Diagnosis not present

## 2017-04-21 DIAGNOSIS — M47812 Spondylosis without myelopathy or radiculopathy, cervical region: Secondary | ICD-10-CM | POA: Diagnosis not present

## 2017-04-21 DIAGNOSIS — G894 Chronic pain syndrome: Secondary | ICD-10-CM | POA: Diagnosis not present

## 2017-04-21 DIAGNOSIS — Z79891 Long term (current) use of opiate analgesic: Secondary | ICD-10-CM | POA: Diagnosis not present

## 2017-04-21 DIAGNOSIS — M545 Low back pain: Secondary | ICD-10-CM | POA: Diagnosis not present

## 2017-04-21 DIAGNOSIS — M47817 Spondylosis without myelopathy or radiculopathy, lumbosacral region: Secondary | ICD-10-CM | POA: Diagnosis not present

## 2017-04-21 DIAGNOSIS — M5137 Other intervertebral disc degeneration, lumbosacral region: Secondary | ICD-10-CM | POA: Diagnosis not present

## 2017-04-21 DIAGNOSIS — Z950 Presence of cardiac pacemaker: Secondary | ICD-10-CM

## 2017-04-21 DIAGNOSIS — M5416 Radiculopathy, lumbar region: Secondary | ICD-10-CM | POA: Diagnosis not present

## 2017-04-21 DIAGNOSIS — M546 Pain in thoracic spine: Secondary | ICD-10-CM | POA: Diagnosis not present

## 2017-04-21 DIAGNOSIS — M5412 Radiculopathy, cervical region: Secondary | ICD-10-CM | POA: Diagnosis not present

## 2017-04-21 DIAGNOSIS — M5136 Other intervertebral disc degeneration, lumbar region: Secondary | ICD-10-CM | POA: Diagnosis not present

## 2017-04-21 DIAGNOSIS — M5135 Other intervertebral disc degeneration, thoracolumbar region: Secondary | ICD-10-CM | POA: Diagnosis not present

## 2017-04-21 NOTE — Progress Notes (Signed)
Patient Care Team: Leone Haven, MD as PCP - General (Family Medicine)   HPI  Darlene Yoder is a 83 y.o. female Seen in follow-up for pacemaker implantation remotely. Her device reached ERI and she underwent generator replacement 2/18. She also underwent generator relocation more cephalad  She is mostly wheel chair bound but mentally active and sharp. 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 No bleeding issues  Denies shortness of breath. Walking though was quite limited. She has not had peripheral edema. She's had no chest pain.  Thromboembolic risk profile notable for age-77 gender-1 hypertension-1 for a CHADS-VASc score 4  Records and Results Reviewed   Past Medical History:  Diagnosis Date  . Arthritis   . Atrial fibrillation ([place])   . Carcinoma of the skin, basal cell   . CHF (congestive heart failure) ([place])   . Chickenpox   . CKD (chronic kidney disease)   . GERD (gastroesophageal reflux disease)   . Hypertension   . Low sodium levels   . Pacemaker   . Urge incontinence   . UTI (lower urinary tract infection)     Past Surgical History:  Procedure Laterality Date  . ABDOMINAL HYSTERECTOMY    . APPENDECTOMY    . BASAL CELL CARCINOMA EXCISION Left    cheek  . broken pelvis    . BUNIONECTOMY Bilateral   . CARDIAC CATHETERIZATION    . CORONARY ANGIOGRAM  2015   [place]   . CORONARY ANGIOGRAM  2015  . HEMORRHOID SURGERY    . PACEMAKER INSERTION  12/25/2008   [employer] pacemaker DRRF 2210 Serial [id number]  . PACEMAKER PLACEMENT  2005  . PPM GENERATOR CHANGEOUT N/A 01/14/2017   Procedure: PPM Generator Changeout;  Surgeon: Deboraha Sprang, MD;  Location: [place] [facility];  Service: Cardiovascular;  Laterality: N/A;  . SKIN SURGERY     removal of a mass on cheek  . SMALL INTESTINE SURGERY  2016  . TONSILLECTOMY      Current Outpatient Prescriptions  Medication Sig  Dispense Refill  . beta carotene w/minerals (OCUVITE) tablet Take 1 tablet by mouth daily.    . Calcium 600-200 MG-UNIT tablet Take 1 tablet by mouth daily.    Marland Kitchen ELIQUIS 2.5 MG TABS tablet TAKE ONE TABLET BY MOUTH TWICE DAILY 60 tablet 5  . HYDROcodone-acetaminophen (NORCO) 7.5-325 MG tablet Limit one half to one tab by mouth per day or 2-3 times per day if tolerated 60 tablet 0  . metoprolol succinate (TOPROL-XL) 50 MG 24 hr tablet TAKE 1 TABLET (50 MG TOTAL) BY MOUTH DAILY. TAKE WITH OR IMMEDIATELY FOLLOWING A MEAL. 90 tablet 0  . Multiple Vitamins-Minerals (CENTRUM SILVER PO) Take 1 tablet by mouth daily.     . ondansetron (ZOFRAN) 4 MG tablet Take 1 tablet (4 mg total) by mouth every 6 (six) hours as needed for nausea. 20 tablet 0  . oxybutynin (DITROPAN) 5 MG tablet TAKE 1 TABLET (5 MG TOTAL) BY MOUTH 2 (TWO) TIMES DAILY. 60 tablet 6  . pantoprazole (PROTONIX) 40 MG tablet TAKE 1 TABLET EVERY DAY 30 tablet 0  . polyethylene glycol powder (GLYCOLAX/MIRALAX) powder TAKE 17 G BY MOUTH 2 (TWO) TIMES DAILY AS NEEDED. 527 g 1  . Polyvinyl Alcohol-Povidone (REFRESH OP) Apply 1 drop to eye 2 (two) times daily as needed (dry eyes).    . sodium chloride (OCEAN) 0.65 %  SOLN nasal spray Place 1 spray into both nostrils 2 (two) times daily as needed for congestion.    . traZODone (DESYREL) 50 MG tablet TAKE 1 TABLET AT BEDTIME 90 tablet 1   No current facility-administered medications for this visit.     Allergies  Allergen Reactions  . Tetanus Toxoids Rash      Review of Systems negative except from HPI and PMH  Physical Exam BP 140/80 (BP Location: Left Arm, Patient Position: Sitting, Cuff Size: Normal)   Pulse 70   Ht 5' (1.524 m)   Wt 116 lb 8 oz (52.8 kg)   BMI 22.75 kg/m  Well developed and nourished in no acute distress sitting in a wheelchair HENT normal Neck supple  Kyphosis severe Carotids brisk and full without bruits Clear Regular rate and rhythm, no murmurs or  gallops Abd-soft with active BS without hepatomegaly No Clubbing cyanosis edema Skin-warm and dry skin bruising A & Oriented  Grossly normal sensory and motor function   ECG demonstrates  atrial fibrillation with ventricular pacing at 70  Assessment and  Plan   Complete heart block  Permanent Atrial fibrillation  Hypertension  Pacemaker [employer]  On Anticoagulation;  No bleeding issues   BP well controlled   Stable device function    Current medicines are reviewed at length with the patient today .  The patient does not  have concerns regarding medicines.

## 2017-04-21 NOTE — Patient Instructions (Signed)
Medication Instructions: - Your physician recommends that you continue on your current medications as directed. Please refer to the Current Medication list given to you today.  Labwork: - none ordered  Procedures/Testing: - none ordered  Follow-Up: - Remote monitoring is used to monitor your Pacemaker of ICD from home. This monitoring reduces the number of office visits required to check your device to one time per year. It allows [place] to keep an eye on the functioning of your device to ensure it is working properly. You are scheduled for a device check from home on 07/21/17. You may send your transmission at any time that day. If you have a wireless device, the transmission will be sent automatically. After your physician reviews your transmission, you will receive a postcard with your next transmission date.  - Your physician wants you to follow-up in: 9 months with Dr. Caryl Comes. You will receive a reminder letter in the mail two months in advance. If you don't receive a letter, please call our office to schedule the follow-up appointment.   Any Additional Special Instructions Will Be Listed Below (If Applicable).     If you need a refill on your cardiac medications before your next appointment, please call your pharmacy.

## 2017-04-27 ENCOUNTER — Other Ambulatory Visit: Payer: Self-pay | Admitting: Internal Medicine

## 2017-05-10 LAB — CUP PACEART INCLINIC DEVICE CHECK
Battery Voltage: 2.99 V
Brady Statistic RA Percent Paced: 0.08 %
Brady Statistic RV Percent Paced: 99.96 %
Date Time Interrogation Session: 20180529143225
Implantable Lead Implant Date: 20100201
Implantable Lead Location: 753859
Implantable Lead Location: 753860
Lead Channel Impedance Value: 387.5 Ohm
Lead Channel Setting Sensing Sensitivity: 4 mV
MDC IDC LEAD IMPLANT DT: 20100201
MDC IDC MSMT LEADCHNL RV IMPEDANCE VALUE: 600 Ohm
MDC IDC MSMT LEADCHNL RV PACING THRESHOLD AMPLITUDE: 0.75 V
MDC IDC MSMT LEADCHNL RV PACING THRESHOLD PULSEWIDTH: 0.5 ms
MDC IDC PG IMPLANT DT: 20180221
MDC IDC PG SERIAL: 8001150
MDC IDC SET LEADCHNL RV PACING AMPLITUDE: 2.5 V
MDC IDC SET LEADCHNL RV PACING PULSEWIDTH: 0.5 ms
Pulse Gen Model: 2272

## 2017-05-17 ENCOUNTER — Inpatient Hospital Stay (HOSPITAL_COMMUNITY): Payer: PPO

## 2017-05-17 ENCOUNTER — Emergency Department (HOSPITAL_COMMUNITY): Payer: PPO

## 2017-05-17 ENCOUNTER — Emergency Department
Admission: EM | Admit: 2017-05-17 | Discharge: 2017-05-17 | Disposition: A | Discharge status: Home / Self Care | Payer: PPO | Attending: Emergency Medicine | Admitting: Emergency Medicine

## 2017-05-17 ENCOUNTER — Inpatient Hospital Stay (HOSPITAL_COMMUNITY)
Admission: EM | Admit: 2017-05-17 | Discharge: 2017-05-20 | DRG: 536 | Disposition: A | Discharge status: Skilled Nursing Facility | Payer: PPO | Attending: General Surgery | Admitting: General Surgery

## 2017-05-17 ENCOUNTER — Emergency Department: Payer: PPO

## 2017-05-17 ENCOUNTER — Encounter (HOSPITAL_COMMUNITY): Payer: Self-pay | Admitting: Emergency Medicine

## 2017-05-17 DIAGNOSIS — K219 Gastro-esophageal reflux disease without esophagitis: Secondary | ICD-10-CM | POA: Diagnosis present

## 2017-05-17 DIAGNOSIS — I509 Heart failure, unspecified: Secondary | ICD-10-CM | POA: Diagnosis not present

## 2017-05-17 DIAGNOSIS — S329XXA Fracture of unspecified parts of lumbosacral spine and pelvis, initial encounter for closed fracture: Secondary | ICD-10-CM

## 2017-05-17 DIAGNOSIS — S32592A Other specified fracture of left pubis, initial encounter for closed fracture: Secondary | ICD-10-CM | POA: Insufficient documentation

## 2017-05-17 DIAGNOSIS — Z79891 Long term (current) use of opiate analgesic: Secondary | ICD-10-CM

## 2017-05-17 DIAGNOSIS — Z950 Presence of cardiac pacemaker: Secondary | ICD-10-CM | POA: Diagnosis not present

## 2017-05-17 DIAGNOSIS — W1830XA Fall on same level, unspecified, initial encounter: Secondary | ICD-10-CM | POA: Diagnosis not present

## 2017-05-17 DIAGNOSIS — Y92009 Unspecified place in unspecified non-institutional (private) residence as the place of occurrence of the external cause: Secondary | ICD-10-CM | POA: Diagnosis not present

## 2017-05-17 DIAGNOSIS — N3941 Urge incontinence: Secondary | ICD-10-CM | POA: Diagnosis not present

## 2017-05-17 DIAGNOSIS — S32501A Unspecified fracture of right pubis, initial encounter for closed fracture: Secondary | ICD-10-CM | POA: Diagnosis not present

## 2017-05-17 DIAGNOSIS — Y998 Other external cause status: Secondary | ICD-10-CM | POA: Insufficient documentation

## 2017-05-17 DIAGNOSIS — Y9389 Activity, other specified: Secondary | ICD-10-CM | POA: Insufficient documentation

## 2017-05-17 DIAGNOSIS — S2242XA Multiple fractures of ribs, left side, initial encounter for closed fracture: Secondary | ICD-10-CM | POA: Diagnosis not present

## 2017-05-17 DIAGNOSIS — Z79899 Other long term (current) drug therapy: Secondary | ICD-10-CM | POA: Diagnosis not present

## 2017-05-17 DIAGNOSIS — S22000A Wedge compression fracture of unspecified thoracic vertebra, initial encounter for closed fracture: Secondary | ICD-10-CM | POA: Diagnosis not present

## 2017-05-17 DIAGNOSIS — W010XXA Fall on same level from slipping, tripping and stumbling without subsequent striking against object, initial encounter: Secondary | ICD-10-CM | POA: Insufficient documentation

## 2017-05-17 DIAGNOSIS — S3282XA Multiple fractures of pelvis without disruption of pelvic ring, initial encounter for closed fracture: Secondary | ICD-10-CM

## 2017-05-17 DIAGNOSIS — M25571 Pain in right ankle and joints of right foot: Secondary | ICD-10-CM

## 2017-05-17 DIAGNOSIS — M542 Cervicalgia: Secondary | ICD-10-CM | POA: Insufficient documentation

## 2017-05-17 DIAGNOSIS — W19XXXA Unspecified fall, initial encounter: Secondary | ICD-10-CM | POA: Diagnosis present

## 2017-05-17 DIAGNOSIS — S22020A Wedge compression fracture of second thoracic vertebra, initial encounter for closed fracture: Secondary | ICD-10-CM | POA: Diagnosis not present

## 2017-05-17 DIAGNOSIS — I4891 Unspecified atrial fibrillation: Secondary | ICD-10-CM | POA: Diagnosis present

## 2017-05-17 DIAGNOSIS — S3992XA Unspecified injury of lower back, initial encounter: Secondary | ICD-10-CM | POA: Diagnosis not present

## 2017-05-17 DIAGNOSIS — R079 Chest pain, unspecified: Secondary | ICD-10-CM | POA: Diagnosis not present

## 2017-05-17 DIAGNOSIS — Z7901 Long term (current) use of anticoagulants: Secondary | ICD-10-CM | POA: Insufficient documentation

## 2017-05-17 DIAGNOSIS — G894 Chronic pain syndrome: Secondary | ICD-10-CM | POA: Diagnosis not present

## 2017-05-17 DIAGNOSIS — R262 Difficulty in walking, not elsewhere classified: Secondary | ICD-10-CM | POA: Diagnosis not present

## 2017-05-17 DIAGNOSIS — S12100A Unspecified displaced fracture of second cervical vertebra, initial encounter for closed fracture: Secondary | ICD-10-CM | POA: Diagnosis not present

## 2017-05-17 DIAGNOSIS — I10 Essential (primary) hypertension: Secondary | ICD-10-CM | POA: Diagnosis not present

## 2017-05-17 DIAGNOSIS — G8929 Other chronic pain: Secondary | ICD-10-CM | POA: Diagnosis not present

## 2017-05-17 DIAGNOSIS — R1311 Dysphagia, oral phase: Secondary | ICD-10-CM | POA: Diagnosis not present

## 2017-05-17 DIAGNOSIS — S22018A Other fracture of first thoracic vertebra, initial encounter for closed fracture: Secondary | ICD-10-CM | POA: Insufficient documentation

## 2017-05-17 DIAGNOSIS — E871 Hypo-osmolality and hyponatremia: Secondary | ICD-10-CM | POA: Diagnosis not present

## 2017-05-17 DIAGNOSIS — S22009A Unspecified fracture of unspecified thoracic vertebra, initial encounter for closed fracture: Secondary | ICD-10-CM | POA: Diagnosis not present

## 2017-05-17 DIAGNOSIS — K449 Diaphragmatic hernia without obstruction or gangrene: Secondary | ICD-10-CM | POA: Diagnosis present

## 2017-05-17 DIAGNOSIS — Z85828 Personal history of other malignant neoplasm of skin: Secondary | ICD-10-CM | POA: Diagnosis not present

## 2017-05-17 DIAGNOSIS — R1012 Left upper quadrant pain: Secondary | ICD-10-CM | POA: Diagnosis not present

## 2017-05-17 DIAGNOSIS — S79912A Unspecified injury of left hip, initial encounter: Secondary | ICD-10-CM | POA: Diagnosis not present

## 2017-05-17 DIAGNOSIS — S22029D Unspecified fracture of second thoracic vertebra, subsequent encounter for fracture with routine healing: Secondary | ICD-10-CM | POA: Diagnosis not present

## 2017-05-17 DIAGNOSIS — S2232XD Fracture of one rib, left side, subsequent encounter for fracture with routine healing: Secondary | ICD-10-CM | POA: Diagnosis not present

## 2017-05-17 DIAGNOSIS — S32810A Multiple fractures of pelvis with stable disruption of pelvic ring, initial encounter for closed fracture: Principal | ICD-10-CM | POA: Diagnosis present

## 2017-05-17 DIAGNOSIS — S32512A Fracture of superior rim of left pubis, initial encounter for closed fracture: Secondary | ICD-10-CM | POA: Insufficient documentation

## 2017-05-17 DIAGNOSIS — D62 Acute posthemorrhagic anemia: Secondary | ICD-10-CM | POA: Diagnosis not present

## 2017-05-17 DIAGNOSIS — S2232XA Fracture of one rib, left side, initial encounter for closed fracture: Secondary | ICD-10-CM | POA: Diagnosis not present

## 2017-05-17 DIAGNOSIS — N189 Chronic kidney disease, unspecified: Secondary | ICD-10-CM | POA: Insufficient documentation

## 2017-05-17 DIAGNOSIS — M4120 Other idiopathic scoliosis, site unspecified: Secondary | ICD-10-CM | POA: Diagnosis not present

## 2017-05-17 DIAGNOSIS — S22019D Unspecified fracture of first thoracic vertebra, subsequent encounter for fracture with routine healing: Secondary | ICD-10-CM | POA: Diagnosis not present

## 2017-05-17 DIAGNOSIS — Z9181 History of falling: Secondary | ICD-10-CM | POA: Diagnosis not present

## 2017-05-17 DIAGNOSIS — H04123 Dry eye syndrome of bilateral lacrimal glands: Secondary | ICD-10-CM | POA: Diagnosis not present

## 2017-05-17 DIAGNOSIS — I130 Hypertensive heart and chronic kidney disease with heart failure and stage 1 through stage 4 chronic kidney disease, or unspecified chronic kidney disease: Secondary | ICD-10-CM | POA: Diagnosis not present

## 2017-05-17 DIAGNOSIS — R102 Pelvic and perineal pain: Secondary | ICD-10-CM | POA: Diagnosis not present

## 2017-05-17 DIAGNOSIS — S32502A Unspecified fracture of left pubis, initial encounter for closed fracture: Secondary | ICD-10-CM | POA: Diagnosis not present

## 2017-05-17 DIAGNOSIS — S0990XA Unspecified injury of head, initial encounter: Secondary | ICD-10-CM | POA: Diagnosis not present

## 2017-05-17 DIAGNOSIS — S2239XA Fracture of one rib, unspecified side, initial encounter for closed fracture: Secondary | ICD-10-CM

## 2017-05-17 DIAGNOSIS — S3282XD Multiple fractures of pelvis without disruption of pelvic ring, subsequent encounter for fracture with routine healing: Secondary | ICD-10-CM | POA: Diagnosis not present

## 2017-05-17 DIAGNOSIS — S22029A Unspecified fracture of second thoracic vertebra, initial encounter for closed fracture: Secondary | ICD-10-CM | POA: Diagnosis present

## 2017-05-17 DIAGNOSIS — S299XXA Unspecified injury of thorax, initial encounter: Secondary | ICD-10-CM | POA: Diagnosis not present

## 2017-05-17 DIAGNOSIS — S99911A Unspecified injury of right ankle, initial encounter: Secondary | ICD-10-CM | POA: Diagnosis not present

## 2017-05-17 DIAGNOSIS — M6281 Muscle weakness (generalized): Secondary | ICD-10-CM | POA: Diagnosis not present

## 2017-05-17 LAB — CBC WITH DIFFERENTIAL/PLATELET
Basophils Absolute: 0 10*3/uL (ref 0.0–0.1)
Basophils Relative: 0 %
EOS PCT: 0 %
Eosinophils Absolute: 0 10*3/uL (ref 0.0–0.7)
HCT: 33.9 % — ABNORMAL LOW (ref 36.0–46.0)
Hemoglobin: 11.4 g/dL — ABNORMAL LOW (ref 12.0–15.0)
LYMPHS ABS: 1.1 10*3/uL (ref 0.7–4.0)
LYMPHS PCT: 8 %
MCH: 30.2 pg (ref 26.0–34.0)
MCHC: 33.6 g/dL (ref 30.0–36.0)
MCV: 89.7 fL (ref 78.0–100.0)
MONO ABS: 0.9 10*3/uL (ref 0.1–1.0)
Monocytes Relative: 7 %
Neutro Abs: 11.7 10*3/uL — ABNORMAL HIGH (ref 1.7–7.7)
Neutrophils Relative %: 85 %
PLATELETS: 213 10*3/uL (ref 150–400)
RBC: 3.78 MIL/uL — AB (ref 3.87–5.11)
RDW: 13.4 % (ref 11.5–15.5)
WBC: 13.8 10*3/uL — ABNORMAL HIGH (ref 4.0–10.5)

## 2017-05-17 LAB — COMPREHENSIVE METABOLIC PANEL
ALBUMIN: 3.6 g/dL (ref 3.5–5.0)
ALT: 12 U/L — ABNORMAL LOW (ref 14–54)
AST: 21 U/L (ref 15–41)
Alkaline Phosphatase: 52 U/L (ref 38–126)
Anion gap: 8 (ref 5–15)
BUN: 11 mg/dL (ref 6–20)
CHLORIDE: 98 mmol/L — AB (ref 101–111)
CO2: 22 mmol/L (ref 22–32)
Calcium: 9.1 mg/dL (ref 8.9–10.3)
Creatinine, Ser: 0.96 mg/dL (ref 0.44–1.00)
GFR calc Af Amer: 58 mL/min — ABNORMAL LOW (ref >60)
GFR calc non Af Amer: 50 mL/min — ABNORMAL LOW (ref >60)
GLUCOSE: 144 mg/dL — AB (ref 65–99)
POTASSIUM: 4.3 mmol/L (ref 3.5–5.1)
Sodium: 128 mmol/L — ABNORMAL LOW (ref 135–145)
Total Bilirubin: 1 mg/dL (ref 0.3–1.2)
Total Protein: 6.2 g/dL — ABNORMAL LOW (ref 6.5–8.1)

## 2017-05-17 LAB — URINALYSIS, ROUTINE W REFLEX MICROSCOPIC
BILIRUBIN URINE: NEGATIVE
Glucose, UA: NEGATIVE mg/dL
Hgb urine dipstick: NEGATIVE
KETONES UR: 5 mg/dL — AB
Leukocytes, UA: NEGATIVE
NITRITE: NEGATIVE
PROTEIN: NEGATIVE mg/dL
SPECIFIC GRAVITY, URINE: 1.021 (ref 1.005–1.030)
pH: 5 (ref 5.0–8.0)

## 2017-05-17 LAB — APTT: aPTT: 29 seconds (ref 24–36)

## 2017-05-17 LAB — TYPE AND SCREEN
ABO/RH(D): B POS
Antibody Screen: NEGATIVE

## 2017-05-17 LAB — PROTIME-INR
INR: 1.29
Prothrombin Time: 16.2 seconds — ABNORMAL HIGH (ref 11.4–15.2)

## 2017-05-17 LAB — ABO/RH: ABO/RH(D): B POS

## 2017-05-17 MED ORDER — FENTANYL CITRATE (PF) 100 MCG/2ML IJ SOLN
50.0000 ug | Freq: Once | INTRAMUSCULAR | Status: DC
  Filled 2017-05-17: qty 2

## 2017-05-17 MED ORDER — ONDANSETRON HCL 4 MG/2ML IJ SOLN
4.0000 mg | Freq: Four times a day (QID) | INTRAMUSCULAR | Status: DC | PRN
  Administered 2017-05-17: 4 mg via INTRAVENOUS
  Filled 2017-05-17: qty 2

## 2017-05-17 MED ORDER — ONDANSETRON HCL 4 MG/2ML IJ SOLN
INTRAMUSCULAR | Status: AC
  Filled 2017-05-17: qty 2

## 2017-05-17 MED ORDER — METOPROLOL SUCCINATE ER 25 MG PO TB24
50.0000 mg | ORAL_TABLET | Freq: Every day | ORAL | Status: DC
  Administered 2017-05-18 – 2017-05-20 (×3): 50 mg via ORAL
  Filled 2017-05-17 (×3): qty 2

## 2017-05-17 MED ORDER — ENOXAPARIN SODIUM 30 MG/0.3ML ~~LOC~~ SOLN
30.0000 mg | SUBCUTANEOUS | Status: DC
  Administered 2017-05-18 – 2017-05-19 (×2): 30 mg via SUBCUTANEOUS
  Filled 2017-05-17 (×2): qty 0.3

## 2017-05-17 MED ORDER — PANTOPRAZOLE SODIUM 40 MG PO TBEC
40.0000 mg | DELAYED_RELEASE_TABLET | Freq: Every day | ORAL | Status: DC
  Administered 2017-05-18 – 2017-05-20 (×3): 40 mg via ORAL
  Filled 2017-05-17 (×3): qty 1

## 2017-05-17 MED ORDER — MORPHINE SULFATE (PF) 2 MG/ML IV SOLN
1.0000 mg | Freq: Once | INTRAVENOUS | Status: DC
  Filled 2017-05-17: qty 1

## 2017-05-17 MED ORDER — ONDANSETRON HCL 4 MG/2ML IJ SOLN
4.0000 mg | Freq: Once | INTRAMUSCULAR | Status: DC
  Filled 2017-05-17: qty 2

## 2017-05-17 MED ORDER — ACETAMINOPHEN 325 MG PO TABS
650.0000 mg | ORAL_TABLET | Freq: Four times a day (QID) | ORAL | Status: DC
  Administered 2017-05-18 – 2017-05-20 (×10): 650 mg via ORAL
  Filled 2017-05-17 (×10): qty 2

## 2017-05-17 MED ORDER — MORPHINE SULFATE (PF) 4 MG/ML IV SOLN
1.0000 mg | INTRAVENOUS | Status: DC | PRN
  Administered 2017-05-17 – 2017-05-20 (×5): 2 mg via INTRAVENOUS
  Filled 2017-05-17 (×4): qty 1

## 2017-05-17 MED ORDER — TRAZODONE HCL 50 MG PO TABS
50.0000 mg | ORAL_TABLET | Freq: Every day | ORAL | Status: DC
  Administered 2017-05-17 – 2017-05-19 (×3): 50 mg via ORAL
  Filled 2017-05-17 (×3): qty 1

## 2017-05-17 MED ORDER — IOPAMIDOL (ISOVUE-300) INJECTION 61%
INTRAVENOUS | Status: AC
  Administered 2017-05-17: 100 mL via INTRAVENOUS
  Filled 2017-05-17: qty 100

## 2017-05-17 MED ORDER — POLYVINYL ALCOHOL 1.4 % OP SOLN
1.0000 [drp] | Freq: Two times a day (BID) | OPHTHALMIC | Status: DC
  Administered 2017-05-19 – 2017-05-20 (×3): 1 [drp] via OPHTHALMIC
  Filled 2017-05-17: qty 15

## 2017-05-17 MED ORDER — ONDANSETRON 4 MG PO TBDP
4.0000 mg | ORAL_TABLET | Freq: Once | ORAL | Status: AC
  Administered 2017-05-17: 4 mg via ORAL
  Filled 2017-05-17: qty 1

## 2017-05-17 MED ORDER — POLYETHYLENE GLYCOL 3350 17 G PO PACK
17.0000 g | PACK | Freq: Every day | ORAL | Status: DC
  Administered 2017-05-17 – 2017-05-20 (×4): 17 g via ORAL
  Filled 2017-05-17 (×4): qty 1

## 2017-05-17 MED ORDER — OXYBUTYNIN CHLORIDE 5 MG PO TABS
5.0000 mg | ORAL_TABLET | Freq: Two times a day (BID) | ORAL | Status: DC
  Administered 2017-05-17 – 2017-05-20 (×6): 5 mg via ORAL
  Filled 2017-05-17 (×6): qty 1

## 2017-05-17 MED ORDER — DOCUSATE SODIUM 100 MG PO CAPS
100.0000 mg | ORAL_CAPSULE | Freq: Two times a day (BID) | ORAL | Status: DC
  Administered 2017-05-17 – 2017-05-20 (×6): 100 mg via ORAL
  Filled 2017-05-17 (×6): qty 1

## 2017-05-17 MED ORDER — SALINE SPRAY 0.65 % NA SOLN
1.0000 | Freq: Two times a day (BID) | NASAL | Status: DC | PRN
  Filled 2017-05-17: qty 44

## 2017-05-17 MED ORDER — OXYCODONE-ACETAMINOPHEN 5-325 MG PO TABS
1.0000 | ORAL_TABLET | Freq: Once | ORAL | Status: AC
  Administered 2017-05-17: 1 via ORAL
  Filled 2017-05-17: qty 1

## 2017-05-17 MED ORDER — POTASSIUM CHLORIDE IN NACL 20-0.9 MEQ/L-% IV SOLN
INTRAVENOUS | Status: DC
  Administered 2017-05-17: 23:00:00 via INTRAVENOUS
  Filled 2017-05-17: qty 1000

## 2017-05-17 MED ORDER — WHITE PETROLATUM GEL
Status: AC
  Administered 2017-05-17: 22:00:00
  Filled 2017-05-17: qty 1

## 2017-05-17 MED ORDER — OXYCODONE HCL 5 MG PO TABS
5.0000 mg | ORAL_TABLET | ORAL | Status: DC | PRN
  Administered 2017-05-18 (×2): 5 mg via ORAL
  Filled 2017-05-17 (×2): qty 1

## 2017-05-17 MED ORDER — MORPHINE SULFATE (PF) 4 MG/ML IV SOLN
INTRAVENOUS | Status: AC
Start: 2017-05-17 — End: 2017-05-18
  Filled 2017-05-17: qty 1

## 2017-05-17 NOTE — H&P (Signed)
History   Darlene Yoder is an 83 y.o. female.   Chief Complaint:  Chief Complaint  Patient presents with  . Fall    HPI 83 yo WF with afib on Eliquis, pacemaker, htn, chronic back pain suffered a ground level fall earlier today while getting dressed for church. She uses walker and takes norco twice a day. Pt was standing next to her bed putting on her pants and fell and landed on her left side. Family found pt laying on her L side with her L arm behind her. No loc. No n/v. No abd pain. C/o pain on entire left side.   Has known large hiatal hernia  Past Medical History:  Diagnosis Date  . Arthritis   . Atrial fibrillation ([place])   . Carcinoma of the skin, basal cell   . CHF (congestive heart failure) ([place])   . Chickenpox   . CKD (chronic kidney disease)   . GERD (gastroesophageal reflux disease)   . Hypertension   . Low sodium levels   . Pacemaker   . Urge incontinence   . UTI (lower urinary tract infection)     Past Surgical History:  Procedure Laterality Date  . ABDOMINAL HYSTERECTOMY    . APPENDECTOMY    . BASAL CELL CARCINOMA EXCISION Left    cheek  . broken pelvis    . BUNIONECTOMY Bilateral   . CARDIAC CATHETERIZATION    . CORONARY ANGIOGRAM  2015   [place]   . CORONARY ANGIOGRAM  2015  . HEMORRHOID SURGERY    . PACEMAKER INSERTION  12/25/2008   [employer] pacemaker DRRF 2210 Serial [id number]  . PACEMAKER PLACEMENT  2005  . PPM GENERATOR CHANGEOUT N/A 01/14/2017   Procedure: PPM Generator Changeout;  Surgeon: Deboraha Sprang, MD;  Location: [facility];  Service: Cardiovascular;  Laterality: N/A;  . SKIN SURGERY     removal of a mass on cheek  . SMALL INTESTINE SURGERY  2016  . TONSILLECTOMY      Family History  Problem Relation Age of Onset  . Heart disease Father   . Stroke Father   . Hypertension Father    Social History:  reports that she has never smoked. She has never used smokeless tobacco. She reports that she does not drink alcohol or use  drugs.  Allergies   Allergies  Allergen Reactions  . Tetanus Toxoids Rash    Home Medications   (Not in a hospital admission)  Trauma Course  No results found for this or any previous visit (from the past 48 hour(s)). Dg Ribs Unilateral W/chest Left  Result Date: 05/17/2017 CLINICAL DATA:  Per EMS pt fell at home unwitnessed. Pt states no LOC and no dizziness. Pt states she stood up with her walker and tried to pull her pants up and tripped and fell on her left side. Pt states pain 10/10- mostly left hip and side/shoulder. EXAM: LEFT RIBS AND CHEST - 3+ VIEW COMPARISON:  Chest x-ray dated 10/20/2016. FINDINGS: Cardiomegaly is stable. Atherosclerotic changes noted at the aortic arch. Probable large hiatal hernia. Chronic blunting at the left costophrenic angle, chronic small pleural effusion versus pleural thickening. Lungs otherwise clear. No new lung findings. No pneumothorax seen. Displaced fracture of an upper left anterior-lateral rib, uncertain number. IMPRESSION: 1. Displaced fracture of an upper left-sided rib, anterior-lateral aspect, uncertain number, only seen on 1 of the rib views. 2. Stable cardiomegaly. 3. Probable large hiatal hernia. 4. No new lung findings.  No pneumothorax seen.  Electronically Signed   By: Franki Cabot M.D.   On: 05/17/2017 14:29   Dg Thoracic Spine 2 View  Result Date: 05/17/2017 CLINICAL DATA:  Per EMS pt fell at home unwitnessed. Pt states no LOC and no dizziness. Pt states she stood up with her walker and tried to pull her pants up and tripped and fell on her left side. Pt states pain 10/10- mostly left hip and side/shoulder. EXAM: THORACIC SPINE 2 VIEWS COMPARISON:  None. FINDINGS: Marked kyphosis. Chronic appearing compression deformities of a lower thoracic vertebral body and upper lumbar vertebral body. No acute appearing osseous abnormality. IMPRESSION: 1. Chronic appearing compression deformities of a lower thoracic vertebral body and an upper lumbar  vertebral body. 2. Marked kyphosis. 3. The compression fracture deformities of T1 and T2 are better seen on today's cervical spine CT. Please see the CT report. Electronically Signed   By: Franki Cabot M.D.   On: 05/17/2017 14:34   Ct Head Wo Contrast  Result Date: 05/17/2017 CLINICAL DATA:  Unwitnessed fall. EXAM: CT HEAD WITHOUT CONTRAST CT CERVICAL SPINE WITHOUT CONTRAST TECHNIQUE: Multidetector CT imaging of the head and cervical spine was performed following the standard protocol without intravenous contrast. Multiplanar CT image reconstructions of the cervical spine were also generated. COMPARISON:  None. FINDINGS: CT HEAD FINDINGS Brain: Generalized age related parenchymal atrophy with commensurate dilatation of the ventricles and sulci. Mild chronic small vessel ischemic changes in the periventricular white matter. There is no mass, hemorrhage, edema or other evidence of acute parenchymal abnormality. No extra-axial hemorrhage. Vascular: There are chronic calcified atherosclerotic changes of the large vessels at the skull base. No unexpected hyperdense vessel. Skull: Normal. Negative for fracture or focal lesion. Sinuses/Orbits: No acute finding. Other: None. CT CERVICAL SPINE FINDINGS Alignment: Dextroscoliosis of the lower cervical spine. No evidence of acute vertebral body subluxation. Skull base and vertebrae: Compression fracture deformities of the T1 and T2 vertebral bodies, both compressed approximately 50%, both with minimal retropulsion of the posterior cortex without significant central canal stenosis, both of uncertain age. No fracture line or displaced fracture fragment identified within the cervical spine. Facet joints appear intact and normally aligned throughout. Soft tissues and spinal canal: No prevertebral fluid or swelling. No visible canal hematoma. Disc levels: Disc desiccations throughout the cervical and upper thoracic spine but no more than mild central canal stenosis at any  level. Upper chest: No acute findings.  Aortic atherosclerosis. Other: Carotid atherosclerosis. IMPRESSION: 1. No acute intracranial abnormality. No intracranial mass, hemorrhage or edema. No skull fracture. Atrophy and chronic ischemic changes in white matter. 2. Compression fracture deformities of the T1 and T2 vertebral bodies, both compressed approximately 50%, both with minimal retropulsion of the posterior cortex without significant central canal stenosis, both of uncertain age but more suggestive of acute fractures based on the appearance on sagittal reconstructions. If acute, the involvement of the anterior and posterior portions of the vertebral bodies would be concerning for unstable fractures. No fracture extension is seen into the posterior elements. 3. Degenerative changes throughout the cervical and upper thoracic spine, but no significant central canal stenosis at any level. 4. Aortic atherosclerosis.  Carotid atherosclerosis. These results were called by telephone at the time of interpretation on 05/17/2017 at 2:20 pm to Dr. Lisa Roca , who verbally acknowledged these results. Electronically Signed   By: Franki Cabot M.D.   On: 05/17/2017 14:08   Ct Cervical Spine Wo Contrast  Result Date: 05/17/2017 CLINICAL DATA:  Unwitnessed fall. EXAM: CT  HEAD WITHOUT CONTRAST CT CERVICAL SPINE WITHOUT CONTRAST TECHNIQUE: Multidetector CT imaging of the head and cervical spine was performed following the standard protocol without intravenous contrast. Multiplanar CT image reconstructions of the cervical spine were also generated. COMPARISON:  None. FINDINGS: CT HEAD FINDINGS Brain: Generalized age related parenchymal atrophy with commensurate dilatation of the ventricles and sulci. Mild chronic small vessel ischemic changes in the periventricular white matter. There is no mass, hemorrhage, edema or other evidence of acute parenchymal abnormality. No extra-axial hemorrhage. Vascular: There are chronic  calcified atherosclerotic changes of the large vessels at the skull base. No unexpected hyperdense vessel. Skull: Normal. Negative for fracture or focal lesion. Sinuses/Orbits: No acute finding. Other: None. CT CERVICAL SPINE FINDINGS Alignment: Dextroscoliosis of the lower cervical spine. No evidence of acute vertebral body subluxation. Skull base and vertebrae: Compression fracture deformities of the T1 and T2 vertebral bodies, both compressed approximately 50%, both with minimal retropulsion of the posterior cortex without significant central canal stenosis, both of uncertain age. No fracture line or displaced fracture fragment identified within the cervical spine. Facet joints appear intact and normally aligned throughout. Soft tissues and spinal canal: No prevertebral fluid or swelling. No visible canal hematoma. Disc levels: Disc desiccations throughout the cervical and upper thoracic spine but no more than mild central canal stenosis at any level. Upper chest: No acute findings.  Aortic atherosclerosis. Other: Carotid atherosclerosis. IMPRESSION: 1. No acute intracranial abnormality. No intracranial mass, hemorrhage or edema. No skull fracture. Atrophy and chronic ischemic changes in white matter. 2. Compression fracture deformities of the T1 and T2 vertebral bodies, both compressed approximately 50%, both with minimal retropulsion of the posterior cortex without significant central canal stenosis, both of uncertain age but more suggestive of acute fractures based on the appearance on sagittal reconstructions. If acute, the involvement of the anterior and posterior portions of the vertebral bodies would be concerning for unstable fractures. No fracture extension is seen into the posterior elements. 3. Degenerative changes throughout the cervical and upper thoracic spine, but no significant central canal stenosis at any level. 4. Aortic atherosclerosis.  Carotid atherosclerosis. These results were called by  telephone at the time of interpretation on 05/17/2017 at 2:20 pm to Dr. Lisa Roca , who verbally acknowledged these results. Electronically Signed   By: Franki Cabot M.D.   On: 05/17/2017 14:08   Dg Hip Unilat W Or Wo Pelvis 2-3 Views Left  Result Date: 05/17/2017 CLINICAL DATA:  Unwitnessed fall. EXAM: DG HIP (WITH OR WITHOUT PELVIS) 2-3V LEFT COMPARISON:  None. FINDINGS: Single view of the pelvis and two views of the left hip are provided. Displaced fractures of the left superior and inferior pubic rami, with probable involvement of the symphysis pubis. Probable displaced fracture within the lateral aspect of the right superior pubic ramus. Remainder of the osseous pelvis appears intact and normally aligned, although portions of the sacrum are obscured by overlying bowel gas. Both femoral heads appear appropriately positioned relative to the acetabuli. No fracture seen within the femoral heads or femoral necks. IMPRESSION: 1. Displaced fractures of the left pubic rami, with probable involvement of the left symphysis pubis. 2. Probable displaced fracture within the right superior pubic ramus. 3. No fracture or dislocation at the left hip. Electronically Signed   By: Franki Cabot M.D.   On: 05/17/2017 14:32    Review of Systems  Constitutional: Negative for weight loss.  HENT: Positive for hearing loss (mild). Negative for nosebleeds.   Eyes: Negative for blurred  vision.       Some vision issues   Respiratory: Negative for shortness of breath.   Cardiovascular: Negative for chest pain, palpitations, orthopnea and PND.       Denies DOE; takes eliquis for afib. Recent pacemaker battery exchange  Gastrointestinal: Negative for abdominal pain, nausea and vomiting.       Has large hiatal hernia  Genitourinary: Negative for dysuria and hematuria.  Musculoskeletal: Positive for back pain (chronic back pain).       Uses walker  Skin: Negative for itching and rash.  Neurological: Negative for  dizziness, focal weakness, seizures, loss of consciousness and headaches.       Denies TIAs, amaurosis fugax  Endo/Heme/Allergies: Does not bruise/bleed easily.  Psychiatric/Behavioral: The patient is not nervous/anxious.     Blood pressure 133/67, pulse 71, temperature 98.8 F (37.1 C), temperature source Oral, resp. rate (!) 21, height 5' (1.524 m), weight 52.6 kg (116 lb), SpO2 95 %. Physical Exam  Vitals reviewed. Constitutional: She is oriented to person, place, and time. She appears well-developed and well-nourished. No distress.  Elderly wf  HENT:  Head: Normocephalic and atraumatic.  Right Ear: External ear normal.  Left Ear: External ear normal.  Eyes: Conjunctivae are normal. Pupils are equal, round, and reactive to light. No scleral icterus.  Neck: Normal range of motion. Neck supple. No tracheal deviation present. No thyromegaly present.  +collar  Cardiovascular: Normal rate, regular rhythm, normal heart sounds and intact distal pulses.   Pacemaker; seems reg on exam  Respiratory: Effort normal and breath sounds normal. No stridor. No respiratory distress. She has no wheezes.  GI: Soft. Normal appearance. She exhibits no distension. There is no rigidity, no rebound and no guarding.    Musculoskeletal: She exhibits no edema.       Left hip: She exhibits tenderness.       Left upper leg: She exhibits tenderness.  Chronic Rt knee deformity; TTP in LLQ - hard to tell if in abd vs pelvis. Kyphosis of spine. No obvious step-off. Mild TTP.   Lymphadenopathy:    She has no cervical adenopathy.  Neurological: She is alert and oriented to person, place, and time. She exhibits normal muscle tone.  Skin: Skin is warm and dry. Bruising and ecchymosis noted. No rash noted. She is not diaphoretic. No erythema. No pallor.  Thin skin; Bruising/contusion L shin  Psychiatric: She has a normal mood and affect. Her behavior is normal. Judgment and thought content normal.      Assessment/Plan Fall Rib fx Chronic back pain on chronic narcotic Possible acute T1 and T2 compression fractures B/l pubic rami fractures H/o A fib on chronic anticoagulant HTN Pacemaker  Check labs. None done at [facility] Dr Ronnald Ramp reviewed imaging and not sure if acute T spine fx. rec MRI. If chronic - pt doesn't need to wear C collar. Will see if pacemaker compatible for MRI. Dr Ronnald Ramp informed pt being admitted.   Ortho consult for pelvic fxs  Will scan A/P with contrast given pelvic fx and anticoagulant to evaluate for bleeding.   c spine precautions until we know chronicity of T spine fractures  Check L femur film , cxr  Will ultimately pt/ot consults.   Leighton Ruff. Redmond Pulling, MD, FACS General, Bariatric, & Minimally Invasive Surgery [facility], [place]   [facility] M 05/17/2017, 6:17 PM   Procedures

## 2017-05-17 NOTE — ED Notes (Signed)
X RAY at bedside 

## 2017-05-17 NOTE — ED Triage Notes (Signed)
Received pt as transfer from [place] with c/o fell today onto left side trying to pull up her pants. Pt is taking blood thinner and has fractures to back and pelvis.

## 2017-05-17 NOTE — ED Provider Notes (Signed)
Patient arrived as a transfer from [facility] after multiple fractures from fall. Awake and alert w/ stable VS on arrival. Dr. Redmond Pulling, Trauma, at bedside. Pt admitted to trauma service.    Dail Lerew, Wenda Overland, MD 05/17/17 [phone]

## 2017-05-17 NOTE — ED Provider Notes (Signed)
[facility] Emergency Department Provider Note ____________________________________________   I have reviewed the triage vital signs and the triage nursing note.  HISTORY  Chief Complaint Fall   Historian Patient  HPI Darlene Yoder is a 83 y.o. female lives at home with family members, was heard to take a fall, and family found her lying on her left side with her left arm underneath and backwards behind her. They were able to lift her up and sit on a chair. She is continued to complain of left groin/hip pain which is the worst of it, but also complains of pain at the left ribs around to the left thoracic back. She has chronic neck pain and limited range of motion of her neck, but states it's a little worse than typical. She does not think that she struck her head or loss consciousness, however she is in significant amount of pain in other areas, and has pain into her neck.  Pain increases with breathing and with moving her neck and with moving her left hip. Pain is moderate to severe.  Patient states that she was getting up to use her walker and just fell off balance sideways. Does not report any true syncope. No headache or chest pain or trouble breathing or nausea or vomiting before the episode or overnight.   Past Medical History:  Diagnosis Date  . Arthritis   . Atrial fibrillation ([place])   . Carcinoma of the skin, basal cell   . CHF (congestive heart failure) ([place])   . Chickenpox   . CKD (chronic kidney disease)   . GERD (gastroesophageal reflux disease)   . Hypertension   . Low sodium levels   . Pacemaker   . Urge incontinence   . UTI (lower urinary tract infection)     Patient Active Problem List   Diagnosis Date Noted  . Postherpetic neuralgia 03/20/2017  . Bowel habit changes 03/20/2017  . Complete heart block ([place]) 01/14/2017  . Dysuria 12/24/2016  . DDD (degenerative disc disease), lumbar 07/03/2016  . Facet syndrome, lumbar ([place]) 07/03/2016   . DDD (degenerative disc disease), thoracic 07/03/2016  . Thoracic facet syndrome ([place]) 07/03/2016  . Scoliosis (and kyphoscoliosis), idiopathic 07/03/2016  . Sacroiliac joint dysfunction 07/03/2016  . DDD (degenerative disc disease), cervical 07/03/2016  . DJD (degenerative joint disease) of knee 07/03/2016  . DJD of shoulder 07/03/2016  . GERD (gastroesophageal reflux disease) 03/09/2016  . Dry skin 03/09/2016  . Hyponatremia 01/08/2016  . Presence of permanent cardiac pacemaker 12/07/2015  . Atrial fibrillation ([place]) 12/05/2015  . History of basal cell carcinoma of skin 12/05/2015  . Chronic pain syndrome 12/05/2015  . Congenital blocked tear duct 12/05/2015  . Essential hypertension 12/05/2015  . Urge incontinence 12/05/2015    Past Surgical History:  Procedure Laterality Date  . ABDOMINAL HYSTERECTOMY    . APPENDECTOMY    . BASAL CELL CARCINOMA EXCISION Left    cheek  . broken pelvis    . BUNIONECTOMY Bilateral   . CARDIAC CATHETERIZATION    . CORONARY ANGIOGRAM  2015   [place] [place]   . CORONARY ANGIOGRAM  2015  . HEMORRHOID SURGERY    . PACEMAKER INSERTION  12/25/2008   [employer] pacemaker DRRF 2210 Serial [id number]  . PACEMAKER PLACEMENT  2005  . PPM GENERATOR CHANGEOUT N/A 01/14/2017   Procedure: PPM Generator Changeout;  Surgeon: Deboraha Sprang, MD;  Location: [facility];  Service: Cardiovascular;  Laterality: N/A;  . SKIN SURGERY  removal of a mass on cheek  . SMALL INTESTINE SURGERY  2016  . TONSILLECTOMY      Prior to Admission medications   Medication Sig Start Date End Date Taking? Authorizing Provider  beta carotene w/minerals (OCUVITE) tablet Take 1 tablet by mouth daily.    [provider]  Calcium 600-200 MG-UNIT tablet Take 1 tablet by mouth daily.    [provider]  ELIQUIS 2.5 MG TABS tablet TAKE ONE TABLET BY MOUTH TWICE DAILY 01/20/17   Leone Haven, MD  HYDROcodone-acetaminophen [facility]) 7.5-325 MG tablet Limit one half  to one tab by mouth per day or 2-3 times per day if tolerated 08/04/16   Mohammed Kindle, MD  metoprolol succinate (TOPROL-XL) 50 MG 24 hr tablet TAKE 1 TABLET (50 MG TOTAL) BY MOUTH DAILY. TAKE WITH OR IMMEDIATELY FOLLOWING A MEAL. 02/19/17   Leone Haven, MD  Multiple Vitamins-Minerals (CENTRUM SILVER PO) Take 1 tablet by mouth daily.     [provider]  ondansetron (ZOFRAN) 4 MG tablet Take 1 tablet (4 mg total) by mouth every 6 (six) hours as needed for nausea. 10/10/16   Epifanio Lesches, MD  oxybutynin (DITROPAN) 5 MG tablet TAKE 1 TABLET (5 MG TOTAL) BY MOUTH 2 (TWO) TIMES DAILY. 01/20/17   Leone Haven, MD  pantoprazole (PROTONIX) 40 MG tablet TAKE 1 TABLET EVERY DAY 12/08/16   Leone Haven, MD  polyethylene glycol powder (GLYCOLAX/MIRALAX) powder TAKE 17 G BY MOUTH 2 (TWO) TIMES DAILY AS NEEDED. 03/11/17   Leone Haven, MD  Polyvinyl Alcohol-Povidone (REFRESH OP) Apply 1 drop to eye 2 (two) times daily as needed (dry eyes).    [provider]  sodium chloride (OCEAN) 0.65 % SOLN nasal spray Place 1 spray into both nostrils 2 (two) times daily as needed for congestion.    [provider]  traZODone (DESYREL) 50 MG tablet TAKE 1 TABLET AT BEDTIME 03/24/17   Leone Haven, MD    Allergies  Allergen Reactions  . Tetanus Toxoids Rash    Family History  Problem Relation Age of Onset  . Heart disease Father   . Stroke Father   . Hypertension Father     Social History Social History  Substance Use Topics  . Smoking status: Never Smoker  . Smokeless tobacco: Never Used  . Alcohol use No    Review of Systems  Constitutional: Negative for Recent illness. Eyes: Negative for visual changes. ENT: Negative for sore throat. Cardiovascular: Negative for palpitations. Respiratory: Mild pain with breathing on the left side. Gastrointestinal: Negative for vomiting and diarrhea. Genitourinary:  Musculoskeletal: Neck and left thoracic  back pain as per hpi. Skin: Negative for rash. Neurological: Negative for headache.  ____________________________________________   PHYSICAL EXAM:  VITAL SIGNS: ED Triage Vitals  Enc Vitals Group     BP 05/17/17 1225 (!) 143/73     Pulse Rate 05/17/17 1225 70     Resp 05/17/17 1225 19     Temp 05/17/17 1225 98.2 F (36.8 C)     Temp Source 05/17/17 1225 Oral     SpO2 05/17/17 1225 98 %     Weight 05/17/17 1236 116 lb (52.6 kg)     Height 05/17/17 1236 5' (1.524 m)     Head Circumference --      Peak Flow --      Pain Score 05/17/17 1225 10     Pain Loc --      Pain Edu? --  Excl. in [place]? --      Constitutional: Alert and oriented. In pain. HEENT   Head: Normocephalic and atraumatic.      Eyes: Conjunctivae are normal. Pupils equal and round.       Ears:         Nose: No congestion/rhinnorhea.   Mouth/Throat: Mucous membranes are mildly dry.   Neck: No stridor.  No posterior midline C-spine step-off, but she does have tenderness to palpation. Cardiovascular/Chest: Normal rate, regular rhythm.  No murmurs, rubs, or gallops. Respiratory: Normal respiratory effort without tachypnea nor retractions. Breath sounds are clear and equal bilaterally. No wheezes/rales/rhonchi.  Pain at left chest wall without crepitus or flail chest.  Gastrointestinal: Soft. No distention, no guarding, no rebound. Nontender.    Genitourinary/rectal:Deferred Musculoskeletal: Pelvis stable but tender with motion at the left hip.  Pain at the mid upper thoracic spine.  Vascular is intact in the Neurologic:  Normal speech and language. No gross or focal neurologic deficits are appreciated. Skin:  Skin is warm, dry and intact. No rash noted. Psychiatric: Mood and affect are normal. Speech and behavior are normal. Patient exhibits appropriate insight and judgment.   ____________________________________________  LABS (pertinent positives/negatives)  Labs Reviewed  URINALYSIS, COMPLETE  (UACMP) WITH MICROSCOPIC  BASIC METABOLIC PANEL  CBC WITH DIFFERENTIAL/PLATELET    ____________________________________________    EKG I, Lisa Roca, MD, the attending physician have personally viewed and interpreted all ECGs.  None ____________________________________________  RADIOLOGY All Xrays were viewed by me. Imaging interpreted by Radiologist.   cxr with Left ribs xray:  IMPRESSION: 1. Displaced fracture of an upper left-sided rib, anterior-lateral aspect, uncertain number, only seen on 1 of the rib views. 2. Stable cardiomegaly. 3. Probable large hiatal hernia. 4. No new lung findings.  No pneumothorax seen.   CT head and cervical spine without contrast: IMPRESSION: 1. No acute intracranial abnormality. No intracranial mass, hemorrhage or edema. No skull fracture. Atrophy and chronic ischemic changes in white matter.  2. Compression fracture deformities of the T1 and T2 vertebral bodies, both compressed approximately 50%, both with minimal retropulsion of the posterior cortex without significant central canal stenosis, both of uncertain age but more suggestive of acute fractures based on the appearance on sagittal reconstructions. If acute, the involvement of the anterior and posterior portions of the vertebral bodies would be concerning for unstable fractures. No fracture extension is seen into the posterior elements. 3. Degenerative changes throughout the cervical and upper thoracic spine, but no significant central canal stenosis at any level. 4. Aortic atherosclerosis.  Carotid atherosclerosis.  These results were called by telephone at the time of interpretation on 05/17/2017 at 2:20 pm to Dr. Lisa Roca , who verbally acknowledged these results.   Thoracic spine x-ray: IMPRESSION: 1. Chronic appearing compression deformities of a lower thoracic vertebral body and an upper lumbar vertebral body. 2. Marked kyphosis. 3. The compression fracture  deformities of T1 and T2 are better seen on today's cervical spine CT. Please see the CT report.  Left hip and pelvis: IMPRESSION: 1. Displaced fractures of the left pubic rami, with probable involvement of the left symphysis pubis. 2. Probable displaced fracture within the right superior pubic ramus. 3. No fracture or dislocation at the left hip.   __________________________________________  PROCEDURES  Procedure(s) performed: None  Critical Care performed: None  ____________________________________________   ED COURSE / ASSESSMENT AND PLAN  Pertinent labs & imaging results that were available during my care of the patient were reviewed by me and  considered in my medical decision making (see chart for details).   Ms. Strandberg had what sounds like a mechanical fall and is complaining of pain from her head/neck down the left ribs and back down into the left hip and pelvis.   X-rays show multiple pelvis fractures, left sided rib fracture, and likely acute T1 and T2 compression fractures which have a component concerning for possible unstable.  I discussed with the patient possibly sending to a tertiary center, and they wanted to go to [facility] due to insurance reasons.  I spoke with trauma surgeon who accepts in transfer after he spoke with also Dr. Ronnald Ramp, neurosurgery who agrees could handle this at [facility].  Screening laboratory studies were ordered, and are pending at this point.    CONSULTATIONS:  Dr. Redmond Pulling, Trauma surgery [place], accepts to see in the ED for transfer.   I also spoke with Dr. Ronnald Ramp, neurosurgery - will see in hospital as needed with likely MRI.   Patient / Family / Caregiver informed of clinical course, medical decision-making process, and agree with plan.  ___________________________________________   FINAL CLINICAL IMPRESSION(S) / ED DIAGNOSES   Final diagnoses:  Closed fracture of one rib of left side, initial encounter  Other  closed fracture of first thoracic vertebra, initial encounter ([place])  Traumatic compression fracture of T2 thoracic vertebra, closed, initial encounter ([place])  Closed fracture of left superior pubic ramus, initial encounter ([place])  Closed fracture of left inferior pubic ramus, initial encounter ([place])  Pubic ramus fracture, left, closed, initial encounter [facility])              Note: This dictation was prepared with Dragon dictation. Any transcriptional errors that result from this process are unintentional    Lisa Roca, MD 05/17/17 1601

## 2017-05-17 NOTE — ED Triage Notes (Signed)
Per EMS pt fell at home unwitnessed.  Pt states no LOC and no dizziness.  Pt states she stood up with her walker and tried to pull her pants up and tripped and fell on her left side.  Pt states pain 10/10 on left shoulder and left hip.

## 2017-05-17 NOTE — Progress Notes (Signed)
Patient ID: Darlene Yoder, female   DOB: 04/17/1922, 83 y.o.   MRN: [id number] I was called to review the imaging on this 83 yo who fell. Imaging of C-spine shows suspected T1 and T2 vertebral body fxs with 50% loss of midbody ht, maybe involves the ant and post cortices, age indeterminant, no retropulsion of kyphosis  I am not convinced these are acute injuries, and if so, I feel they are likely stable (T1 is already auto fused to C7) and should heal in an aspen collar. An MRI c-spine would help determine acuity and if found to be chronic could limit her time in a collar. She does not require acute surgical intervention and can be mobilized in a collar as tolerated. If no MRI done then would have her f/u as an outpatient.

## 2017-05-17 NOTE — ED Notes (Signed)
Pts niece states pt has chronically low sodium levels.

## 2017-05-17 NOTE — ED Notes (Signed)
Pacemaker not eligable for MRI. Wilson MD notified via text page.

## 2017-05-18 ENCOUNTER — Inpatient Hospital Stay (HOSPITAL_COMMUNITY): Payer: PPO

## 2017-05-18 DIAGNOSIS — S3282XA Multiple fractures of pelvis without disruption of pelvic ring, initial encounter for closed fracture: Secondary | ICD-10-CM

## 2017-05-18 LAB — BASIC METABOLIC PANEL
Anion gap: 8 (ref 5–15)
BUN: 13 mg/dL (ref 6–20)
CALCIUM: 8.8 mg/dL — AB (ref 8.9–10.3)
CHLORIDE: 100 mmol/L — AB (ref 101–111)
CO2: 23 mmol/L (ref 22–32)
CREATININE: 0.96 mg/dL (ref 0.44–1.00)
GFR calc Af Amer: 58 mL/min — ABNORMAL LOW (ref >60)
GFR calc non Af Amer: 50 mL/min — ABNORMAL LOW (ref >60)
Glucose, Bld: 135 mg/dL — ABNORMAL HIGH (ref 65–99)
Potassium: 4.7 mmol/L (ref 3.5–5.1)
SODIUM: 131 mmol/L — AB (ref 135–145)

## 2017-05-18 LAB — CBC
HCT: 34.1 % — ABNORMAL LOW (ref 36.0–46.0)
HEMOGLOBIN: 11.3 g/dL — AB (ref 12.0–15.0)
MCH: 30.1 pg (ref 26.0–34.0)
MCHC: 33.1 g/dL (ref 30.0–36.0)
MCV: 90.7 fL (ref 78.0–100.0)
Platelets: 208 10*3/uL (ref 150–400)
RBC: 3.76 MIL/uL — ABNORMAL LOW (ref 3.87–5.11)
RDW: 13.6 % (ref 11.5–15.5)
WBC: 10.7 10*3/uL — ABNORMAL HIGH (ref 4.0–10.5)

## 2017-05-18 MED ORDER — SODIUM CHLORIDE 0.9 % IV SOLN
INTRAVENOUS | Status: DC
  Administered 2017-05-18 – 2017-05-19 (×2): via INTRAVENOUS

## 2017-05-18 NOTE — Evaluation (Signed)
Physical Therapy Evaluation Patient Details Name: Darlene Yoder MRN: [id number] DOB: Jul 20, 1922 Today's Date: 05/18/2017   History of Present Illness  83 yo frail female who sustained a mechanical fall.  Was found to have acute left-sided superior and inferior pubic rami fracture and healed right-sided rami fractures and suspected T1, T2 fractures. Pt with PMH: bilateral knee OA, HTN, GERD, CHF, CKD, A-fib  Clinical Impression  Pt admitted with above diagnosis. Pt currently with functional limitations due to the deficits listed below (see PT Problem List). Pt very motivated to move but limited by pain. Was able to sit EOB and stand 3x but unable to step feet to turn to chair and could not tolerate maintained sitting, returned to supine with +2 tot A.  Pt will benefit from skilled PT to increase their independence and safety with mobility to allow discharge to the venue listed below.      Follow Up Recommendations SNF;Supervision/Assistance - 24 hour    Equipment Recommendations  None recommended by PT    Recommendations for Other Services       Precautions / Restrictions Precautions Precautions: Fall Required Braces or Orthoses: Cervical Brace Cervical Brace: Hard collar;At all times Restrictions Weight Bearing Restrictions: Yes RLE Weight Bearing: Weight bearing as tolerated LLE Weight Bearing: Weight bearing as tolerated      Mobility  Bed Mobility Overal bed mobility: Needs Assistance Bed Mobility: Supine to Sit;Sit to Supine     Supine to sit: Max assist Sit to supine: Total assist;+2 for physical assistance   General bed mobility comments: pt able to assist by grasping rail but max A for lower body to EOB and off bed. Tot A for return to supine due to pain  Transfers Overall transfer level: Needs assistance Equipment used: None Transfers: Sit to/from Stand Sit to Stand: Mod assist         General transfer comment: stood in front of pt with arms around. Pt able  to perform sit to stand 3x with mod A but pain to great to step to chair or even squat pivot to chair  Ambulation/Gait             General Gait Details: unable  Stairs            Wheelchair Mobility    Modified Rankin (Stroke Patients Only)       Balance Overall balance assessment: Needs assistance Sitting-balance support: Bilateral upper extremity supported Sitting balance-Leahy Scale: Poor Sitting balance - Comments: UE support needed   Standing balance support: Bilateral upper extremity supported Standing balance-Leahy Scale: Zero                               Pertinent Vitals/Pain Pain Assessment: 0-10 Pain Score: 10-Worst pain ever Pain Location: neck and left chest coming from around back Pain Descriptors / Indicators: Burning;Aching Pain Intervention(s): Limited activity within patient's tolerance;Monitored during session;Premedicated before session    Home Living Family/patient expects to be discharged to:: Skilled nursing facility Living Arrangements: Other relatives Available Help at Discharge: Family;Available PRN/intermittently Type of Home: House Home Access: Stairs to enter   Entrance Stairs-Number of Steps: 4 Home Layout: One level Home Equipment: Walker - 2 wheels Additional Comments: pt lives with her niece. Was able to be alone for portions of the day when niece and her husband had to leave.     Prior Function Level of Independence: Needs assistance   Gait / Transfers Assistance Needed:  ambulated with RW  ADL's / Homemaking Assistance Needed: niece assisted with bathing, cooking, shopping        Hand Dominance        Extremity/Trunk Assessment   Upper Extremity Assessment Upper Extremity Assessment: Generalized weakness    Lower Extremity Assessment Lower Extremity Assessment: Generalized weakness    Cervical / Trunk Assessment Cervical / Trunk Assessment: Kyphotic  Communication   Communication: No  difficulties  Cognition Arousal/Alertness: Awake/alert Behavior During Therapy: WFL for tasks assessed/performed Overall Cognitive Status: Within Functional Limits for tasks assessed                                        General Comments      Exercises     Assessment/Plan    PT Assessment Patient needs continued PT services  PT Problem List Decreased strength;Pain;Decreased activity tolerance;Decreased balance;Decreased mobility;Decreased knowledge of precautions       PT Treatment Interventions DME instruction;Gait training;Functional mobility training;Therapeutic activities;Therapeutic exercise;Balance training;Patient/family education    PT Goals (Current goals can be found in the Care Plan section)  Acute Rehab PT Goals Patient Stated Goal: get better and then return home.  PT Goal Formulation: With patient/family Time For Goal Achievement: 06/01/17 Potential to Achieve Goals: Good    Frequency Min 3X/week   Barriers to discharge Decreased caregiver support family unable to give 24 hr Supervision    Co-evaluation               AM-PAC PT "6 Clicks" Daily Activity  Outcome Measure Difficulty turning over in bed (including adjusting bedclothes, sheets and blankets)?: Total Difficulty moving from lying on back to sitting on the side of the bed? : Total Difficulty sitting down on and standing up from a chair with arms (e.g., wheelchair, bedside commode, etc,.)?: Total Help needed moving to and from a bed to chair (including a wheelchair)?: Total Help needed walking in hospital room?: Total Help needed climbing 3-5 steps with a railing? : Total 6 Click Score: 6    End of Session   Activity Tolerance: Patient limited by pain Patient left: in bed;with call bell/phone within reach;with bed alarm set;with family/visitor present Nurse Communication: Mobility status PT Visit Diagnosis: Muscle weakness (generalized) (M62.81);Pain;Unsteadiness on feet  (R26.81);Other abnormalities of gait and mobility (R26.89);History of falling (Z91.81) Pain - Right/Left: Left Pain - part of body:  (pelvis, neck, chest)    Time: 7017-7939 PT Time Calculation (min) (ACUTE ONLY): 25 min   Charges:   PT Evaluation $PT Eval Moderate Complexity: 1 Procedure PT Treatments $Therapeutic Activity: 8-22 mins   PT G Codes:        [place] 05/18/2017, 2:26 PM

## 2017-05-18 NOTE — NC FL2 (Signed)
[place] LEVEL OF CARE SCREENING TOOL     IDENTIFICATION  Patient Name: Darlene Yoder Birthdate: 09-19-1922 Sex: female Admission Date (Current Location): 05/17/2017  [facility] and [place] Number:  Engineering geologist and Address:  [facility], [place] [address], [place], [place] [address]      Provider Number: [id number]  Attending Physician Name and Address:  Md, Trauma, MD  Relative Name and Phone Number:       Current Level of Care: Hospital Recommended Level of Care: [place] Prior Approval Number:    Date Approved/Denied:   PASRR Number: [id number]  Discharge Plan: SNF    Current Diagnoses: Patient Active Problem List   Diagnosis Date Noted  . Multiple closed pelvic fractures without disruption of pelvic circle (HCC)   . Fall 05/17/2017  . Postherpetic neuralgia 03/20/2017  . Bowel habit changes 03/20/2017  . Complete heart block ([place]) 01/14/2017  . Dysuria 12/24/2016  . DDD (degenerative disc disease), lumbar 07/03/2016  . Facet syndrome, lumbar ([place]) 07/03/2016  . DDD (degenerative disc disease), thoracic 07/03/2016  . Thoracic facet syndrome ([place]) 07/03/2016  . Scoliosis (and kyphoscoliosis), idiopathic 07/03/2016  . Sacroiliac joint dysfunction 07/03/2016  . DDD (degenerative disc disease), cervical 07/03/2016  . DJD (degenerative joint disease) of knee 07/03/2016  . DJD of shoulder 07/03/2016  . GERD (gastroesophageal reflux disease) 03/09/2016  . Dry skin 03/09/2016  . Hyponatremia 01/08/2016  . Presence of permanent cardiac pacemaker 12/07/2015  . Atrial fibrillation ([place]) 12/05/2015  . History of basal cell carcinoma of skin 12/05/2015  . Chronic pain syndrome 12/05/2015  . Congenital blocked tear duct 12/05/2015  . Essential hypertension 12/05/2015  . Urge incontinence 12/05/2015    Orientation RESPIRATION BLADDER Height & Weight     Self, Time, Situation, Place  Normal External catheter  Weight: 116 lb 3.6 oz (52.7 kg) Height:  5' (152.4 cm)  BEHAVIORAL SYMPTOMS/MOOD NEUROLOGICAL BOWEL NUTRITION STATUS      Continent    AMBULATORY STATUS COMMUNICATION OF NEEDS Skin   Extensive Assist Verbally Normal                       Personal Care Assistance Level of Assistance  Bathing, Dressing Bathing Assistance: Maximum assistance   Dressing Assistance: Maximum assistance     Functional Limitations Info             SPECIAL CARE FACTORS FREQUENCY  PT (By licensed PT), OT (By licensed OT)     PT Frequency: 5x/wk OT Frequency: 5x/wk            Contractures      Additional Factors Info  Code Status, Allergies Code Status Info: full Allergies Info: Tetanus Toxoids           Current Medications (05/18/2017):  This is the current hospital active medication list Current Facility-Administered Medications  Medication Dose Route Frequency Provider Last Rate Last Dose  . 0.9 %  sodium chloride infusion   Intravenous Continuous Jill Alexanders, PA-C 50 mL/hr at 05/18/17 1023    . acetaminophen (TYLENOL) tablet 650 mg  650 mg Oral Q6H Greer Pickerel, MD   650 mg at 05/18/17 1206  . docusate sodium (COLACE) capsule 100 mg  100 mg Oral BID Greer Pickerel, MD   100 mg at 05/18/17 8527  . enoxaparin (LOVENOX) injection 30 mg  30 mg Subcutaneous Q24H Greer Pickerel, MD   30 mg at 05/18/17 7824  . metoprolol  succinate (TOPROL-XL) 24 hr tablet 50 mg  50 mg Oral Daily Greer Pickerel, MD   50 mg at 05/18/17 9191  . morphine 4 MG/ML injection 1-2 mg  1-2 mg Intravenous Q2H PRN Greer Pickerel, MD   2 mg at 05/17/17 2153  . ondansetron (ZOFRAN) injection 4 mg  4 mg Intravenous Q6H PRN Greer Pickerel, MD   4 mg at 05/17/17 1823  . oxybutynin (DITROPAN) tablet 5 mg  5 mg Oral BID Greer Pickerel, MD   5 mg at 05/18/17 6606  . oxyCODONE (Oxy IR/ROXICODONE) immediate release tablet 5 mg  5 mg Oral Q4H PRN Greer Pickerel, MD   5 mg at 05/18/17 1403  . pantoprazole (PROTONIX) EC tablet 40 mg   40 mg Oral Daily Greer Pickerel, MD   40 mg at 05/18/17 0045  . polyethylene glycol (MIRALAX / GLYCOLAX) packet 17 g  17 g Oral Daily Greer Pickerel, MD   17 g at 05/18/17 9977  . polyvinyl alcohol (LIQUIFILM TEARS) 1.4 % ophthalmic solution 1 drop  1 drop Both Eyes BID Greer Pickerel, MD      . sodium chloride (OCEAN) 0.65 % nasal spray 1 spray  1 spray Each Nare BID PRN Greer Pickerel, MD      . traZODone (DESYREL) tablet 50 mg  50 mg Oral Toy Care, MD   50 mg at 05/17/17 2252     Discharge Medications: Please see discharge summary for a list of discharge medications.  Relevant Imaging Results:  Relevant Lab Results:   Additional Information SS#: [id number]  Geralynn Ochs, LCSW

## 2017-05-18 NOTE — Consult Note (Signed)
[facility] Primary Care Navigator  05/18/2017  Darlene Yoder 04/20/1922 [id number]   Met with patient, niece Webb Silversmith), nephew and wife Laveda Abbe and Silva Bandy) at the bedside to identify possible discharge needs. Niece reports that patient had fallen while getting dressed for church which had led to this admission. Patient endorses Dr. Tommi Rumps with [employer] at [facility] as the primary care provider.   Patient verbalized using [facility] in [place] to obtain medications without any problem.   Patient was managing hermedications at home straight out of the containers using her own organizing system.  Her niece and husband as well as her nephew and wife are able to provide transportation to her doctors'appointments after discharge.  Patient lives with niece and husband who serve as her primary caregivers at home as stated.  Discharge plan is skilled nursing facility (in process) for rehabilitation prior to returning back home, according to niece.  Patient and niece voiced understanding to call primary care provider's officewhen she returns backhome,for a post discharge follow-up appointment within a week or sooner if needs arise.Patient letter (with PCP's contact number) was provided as areminder.  Both patient and niece denied any health management needs or concerns at this time. [facility] care management information provided for future needs that she may have.   For questions, please contact:  Dannielle Huh, BSN, RN- [facility] Primary Care Navigator  Telephone: [phone] [place]

## 2017-05-18 NOTE — Progress Notes (Signed)
[facility] Progress Note     Subjective: CC:  C/o left-sided chest and hip pain, as well as right ankle pain. Rated 7/10 at rest and 11/10 with movement. At baseline reports eating a mostly full liquid diet 2/2 her teeth and large hiatal hernia. Also reports hyponatremia at baseline, managed with her diet. At baseline she lives with her family members (niece?) who are in their 51's. She ambulates with a walker. Lived independently in [place] until about one year ago. Patient reports that this is not her first fall. Family member at bedside states that their first choice for patient placement in [facility], followed by [facility].   Objective: Vital signs in last 24 hours: Temp:  [98.2 F (36.8 C)-99.2 F (37.3 C)] 98.9 F (37.2 C) (06/25 0400) Pulse Rate:  [70-72] 70 (06/25 0400) Resp:  [13-21] 16 (06/25 0400) BP: (104-160)/(61-78) 132/62 (06/25 0400) SpO2:  [94 %-100 %] 94 % (06/25 0400) Weight:  [52.6 kg (116 lb)-52.7 kg (116 lb 3.6 oz)] 52.7 kg (116 lb 3.6 oz) (06/24 2100)    Intake/Output from previous day: 06/24 0701 - 06/25 0700 In: 250 [I.V.:250] Out: 200 [Urine:200] Intake/Output this shift: No intake/output data recorded.  PE: Gen:  Alert, NAD, pleasant and cooperative Card:  Regular rate and rhythm, pedal pulses 2+ BL Pulm: appropriately TTP left chest wall, Normal effort, clear to auscultation bilaterally with diminished breath sounds in bilateral bases  Abd: Soft, non-tender, non-distended, bowel sounds present in all 4 quadrants Skin: warm and dry, no rashes  MSK: mild TTP R lateral malleolus without obvious swelling or ecchymosis.  Psych: A&Ox3   Lab Results:   Recent Labs  05/17/17 1806 05/18/17 0315  WBC 13.8* 10.7*  HGB 11.4* 11.3*  HCT 33.9* 34.1*  PLT 213 208   BMET  Recent Labs  05/17/17 1806 05/18/17 0315  NA 128* 131*  K 4.3 4.7  CL 98* 100*  CO2 22 23  GLUCOSE 144* 135*  BUN 11 13  CREATININE 0.96 0.96  CALCIUM 9.1 8.8*    PT/INR  Recent Labs  05/17/17 1806  LABPROT 16.2*  INR 1.29   CMP     Component Value Date/Time   NA 131 (L) 05/18/2017 0315   K 4.7 05/18/2017 0315   CL 100 (L) 05/18/2017 0315   CO2 23 05/18/2017 0315   GLUCOSE 135 (H) 05/18/2017 0315   BUN 13 05/18/2017 0315   CREATININE 0.96 05/18/2017 0315   CALCIUM 8.8 (L) 05/18/2017 0315   PROT 6.2 (L) 05/17/2017 1806   ALBUMIN 3.6 05/17/2017 1806   AST 21 05/17/2017 1806   ALT 12 (L) 05/17/2017 1806   ALKPHOS 52 05/17/2017 1806   BILITOT 1.0 05/17/2017 1806   GFRNONAA 50 (L) 05/18/2017 0315   GFRAA 58 (L) 05/18/2017 0315   Lipase     Component Value Date/Time   LIPASE 18 04/11/2017 0106       Studies/Results: Dg Ribs Unilateral W/chest Left  Result Date: 05/17/2017 CLINICAL DATA:  Per EMS pt fell at home unwitnessed. Pt states no LOC and no dizziness. Pt states she stood up with her walker and tried to pull her pants up and tripped and fell on her left side. Pt states pain 10/10- mostly left hip and side/shoulder. EXAM: LEFT RIBS AND CHEST - 3+ VIEW COMPARISON:  Chest x-ray dated 10/20/2016. FINDINGS: Cardiomegaly is stable. Atherosclerotic changes noted at the aortic arch. Probable large hiatal hernia. Chronic blunting at the left costophrenic angle, chronic small  pleural effusion versus pleural thickening. Lungs otherwise clear. No new lung findings. No pneumothorax seen. Displaced fracture of an upper left anterior-lateral rib, uncertain number. IMPRESSION: 1. Displaced fracture of an upper left-sided rib, anterior-lateral aspect, uncertain number, only seen on 1 of the rib views. 2. Stable cardiomegaly. 3. Probable large hiatal hernia. 4. No new lung findings.  No pneumothorax seen. Electronically Signed   By: Franki Cabot M.D.   On: 05/17/2017 14:29   Dg Thoracic Spine 2 View  Result Date: 05/17/2017 CLINICAL DATA:  Per EMS pt fell at home unwitnessed. Pt states no LOC and no dizziness. Pt states she stood up with her  walker and tried to pull her pants up and tripped and fell on her left side. Pt states pain 10/10- mostly left hip and side/shoulder. EXAM: THORACIC SPINE 2 VIEWS COMPARISON:  None. FINDINGS: Marked kyphosis. Chronic appearing compression deformities of a lower thoracic vertebral body and upper lumbar vertebral body. No acute appearing osseous abnormality. IMPRESSION: 1. Chronic appearing compression deformities of a lower thoracic vertebral body and an upper lumbar vertebral body. 2. Marked kyphosis. 3. The compression fracture deformities of T1 and T2 are better seen on today's cervical spine CT. Please see the CT report. Electronically Signed   By: Franki Cabot M.D.   On: 05/17/2017 14:34   Ct Head Wo Contrast  Result Date: 05/17/2017 CLINICAL DATA:  Unwitnessed fall. EXAM: CT HEAD WITHOUT CONTRAST CT CERVICAL SPINE WITHOUT CONTRAST TECHNIQUE: Multidetector CT imaging of the head and cervical spine was performed following the standard protocol without intravenous contrast. Multiplanar CT image reconstructions of the cervical spine were also generated. COMPARISON:  None. FINDINGS: CT HEAD FINDINGS Brain: Generalized age related parenchymal atrophy with commensurate dilatation of the ventricles and sulci. Mild chronic small vessel ischemic changes in the periventricular white matter. There is no mass, hemorrhage, edema or other evidence of acute parenchymal abnormality. No extra-axial hemorrhage. Vascular: There are chronic calcified atherosclerotic changes of the large vessels at the skull base. No unexpected hyperdense vessel. Skull: Normal. Negative for fracture or focal lesion. Sinuses/Orbits: No acute finding. Other: None. CT CERVICAL SPINE FINDINGS Alignment: Dextroscoliosis of the lower cervical spine. No evidence of acute vertebral body subluxation. Skull base and vertebrae: Compression fracture deformities of the T1 and T2 vertebral bodies, both compressed approximately 50%, both with minimal  retropulsion of the posterior cortex without significant central canal stenosis, both of uncertain age. No fracture line or displaced fracture fragment identified within the cervical spine. Facet joints appear intact and normally aligned throughout. Soft tissues and spinal canal: No prevertebral fluid or swelling. No visible canal hematoma. Disc levels: Disc desiccations throughout the cervical and upper thoracic spine but no more than mild central canal stenosis at any level. Upper chest: No acute findings.  Aortic atherosclerosis. Other: Carotid atherosclerosis. IMPRESSION: 1. No acute intracranial abnormality. No intracranial mass, hemorrhage or edema. No skull fracture. Atrophy and chronic ischemic changes in white matter. 2. Compression fracture deformities of the T1 and T2 vertebral bodies, both compressed approximately 50%, both with minimal retropulsion of the posterior cortex without significant central canal stenosis, both of uncertain age but more suggestive of acute fractures based on the appearance on sagittal reconstructions. If acute, the involvement of the anterior and posterior portions of the vertebral bodies would be concerning for unstable fractures. No fracture extension is seen into the posterior elements. 3. Degenerative changes throughout the cervical and upper thoracic spine, but no significant central canal stenosis at any level. 4.  Aortic atherosclerosis.  Carotid atherosclerosis. These results were called by telephone at the time of interpretation on 05/17/2017 at 2:20 pm to Dr. Lisa Roca , who verbally acknowledged these results. Electronically Signed   By: Franki Cabot M.D.   On: 05/17/2017 14:08   Ct Cervical Spine Wo Contrast  Result Date: 05/17/2017 CLINICAL DATA:  Unwitnessed fall. EXAM: CT HEAD WITHOUT CONTRAST CT CERVICAL SPINE WITHOUT CONTRAST TECHNIQUE: Multidetector CT imaging of the head and cervical spine was performed following the standard protocol without  intravenous contrast. Multiplanar CT image reconstructions of the cervical spine were also generated. COMPARISON:  None. FINDINGS: CT HEAD FINDINGS Brain: Generalized age related parenchymal atrophy with commensurate dilatation of the ventricles and sulci. Mild chronic small vessel ischemic changes in the periventricular white matter. There is no mass, hemorrhage, edema or other evidence of acute parenchymal abnormality. No extra-axial hemorrhage. Vascular: There are chronic calcified atherosclerotic changes of the large vessels at the skull base. No unexpected hyperdense vessel. Skull: Normal. Negative for fracture or focal lesion. Sinuses/Orbits: No acute finding. Other: None. CT CERVICAL SPINE FINDINGS Alignment: Dextroscoliosis of the lower cervical spine. No evidence of acute vertebral body subluxation. Skull base and vertebrae: Compression fracture deformities of the T1 and T2 vertebral bodies, both compressed approximately 50%, both with minimal retropulsion of the posterior cortex without significant central canal stenosis, both of uncertain age. No fracture line or displaced fracture fragment identified within the cervical spine. Facet joints appear intact and normally aligned throughout. Soft tissues and spinal canal: No prevertebral fluid or swelling. No visible canal hematoma. Disc levels: Disc desiccations throughout the cervical and upper thoracic spine but no more than mild central canal stenosis at any level. Upper chest: No acute findings.  Aortic atherosclerosis. Other: Carotid atherosclerosis. IMPRESSION: 1. No acute intracranial abnormality. No intracranial mass, hemorrhage or edema. No skull fracture. Atrophy and chronic ischemic changes in white matter. 2. Compression fracture deformities of the T1 and T2 vertebral bodies, both compressed approximately 50%, both with minimal retropulsion of the posterior cortex without significant central canal stenosis, both of uncertain age but more  suggestive of acute fractures based on the appearance on sagittal reconstructions. If acute, the involvement of the anterior and posterior portions of the vertebral bodies would be concerning for unstable fractures. No fracture extension is seen into the posterior elements. 3. Degenerative changes throughout the cervical and upper thoracic spine, but no significant central canal stenosis at any level. 4. Aortic atherosclerosis.  Carotid atherosclerosis. These results were called by telephone at the time of interpretation on 05/17/2017 at 2:20 pm to Dr. Lisa Roca , who verbally acknowledged these results. Electronically Signed   By: Franki Cabot M.D.   On: 05/17/2017 14:08   Ct Abdomen Pelvis W Contrast  Result Date: 05/17/2017 CLINICAL DATA:  Ground level fall with pelvic fractures EXAM: CT ABDOMEN AND PELVIS WITH CONTRAST TECHNIQUE: Multidetector CT imaging of the abdomen and pelvis was performed using the standard protocol following bolus administration of intravenous contrast. CONTRAST:  140mL ISOVUE-300 IOPAMIDOL (ISOVUE-300) INJECTION 61% COMPARISON:  04/11/2017, 05/17/2017, 10/08/2016 FINDINGS: Lower chest: Patchy dependent atelectasis within the posterior right lung base. Trace left pleural effusion. Large hiatal hernia. Cardiomegaly with partially visualized pacing leads. Coronary artery calcifications. Hepatobiliary: Moderate to marked intra and extrahepatic biliary dilatation, slightly progressed since the previous exam, the extrahepatic bile duct measures up to 16 mm. Small calcified stones in the gallbladder. Possible small focus of fat infiltration near the falciform ligament. Pancreas: No inflammation.  Diffuse  atrophy. Spleen: Normal in size without focal abnormality. Adrenals/Urinary Tract: Adrenal glands are within normal limits. Mildly atrophic kidneys. No hydronephrosis. The bladder is unremarkable Stomach/Bowel: Stomach is nonenlarged. No dilated small bowel. No colon wall thickening.  Moderate to large amount of stool within the colon Vascular/Lymphatic: Extensive atherosclerotic vascular calcification. No aneurysmal dilatation. No significantly enlarged lymph nodes Reproductive: Status post hysterectomy. No adnexal masses. Other: No free air or free fluid. Musculoskeletal: Old right inferior and superior pubic rami fractures. Acute, mildly displaced and comminuted left inferior and superior pubic rami fractures with extension to the pubic symphysis. No significant widening. Small left pelvic sidewall hematoma and small amount of edema/ hematoma around the left pubic symphysis. Old compression deformities of L2 and L5. Extensive degenerative changes IMPRESSION: 1. No CT evidence for acute solid organ injury or free air 2. Acute, mildly displaced and comminuted left inferior and superior pubic rami fractures with extension to the left pubic symphysis. No significant widening of the symphysis. Small left pelvic sidewall hematoma and small amount of edema/hematoma around the left pubic symphysis. 3. Old compression fractures of L2 and L5 4. Moderate intra and extrahepatic biliary dilatation, increased compared to prior and again concerning for distal obstructing process. Gallstones 5. Large hiatal hernia Electronically Signed   By: Donavan Foil M.D.   On: 05/17/2017 20:56   Dg Chest Port 1 View  Result Date: 05/17/2017 CLINICAL DATA:  Recent fall EXAM: PORTABLE CHEST 1 VIEW COMPARISON:  05/17/2017 FINDINGS: Cardiac shadow remains enlarged. Large hiatal hernia is again seen. No pneumothorax is noted. The known left rib fracture is not well appreciated on this exam. The lungs are clear. No other focal abnormality is noted. Pacing device is again seen and stable. IMPRESSION: No acute abnormality noted. Chronic changes as described. Electronically Signed   By: Inez Catalina M.D.   On: 05/17/2017 19:39   Dg Hip Unilat W Or Wo Pelvis 2-3 Views Left  Result Date: 05/17/2017 CLINICAL DATA:   Unwitnessed fall. EXAM: DG HIP (WITH OR WITHOUT PELVIS) 2-3V LEFT COMPARISON:  None. FINDINGS: Single view of the pelvis and two views of the left hip are provided. Displaced fractures of the left superior and inferior pubic rami, with probable involvement of the symphysis pubis. Probable displaced fracture within the lateral aspect of the right superior pubic ramus. Remainder of the osseous pelvis appears intact and normally aligned, although portions of the sacrum are obscured by overlying bowel gas. Both femoral heads appear appropriately positioned relative to the acetabuli. No fracture seen within the femoral heads or femoral necks. IMPRESSION: 1. Displaced fractures of the left pubic rami, with probable involvement of the left symphysis pubis. 2. Probable displaced fracture within the right superior pubic ramus. 3. No fracture or dislocation at the left hip. Electronically Signed   By: Franki Cabot M.D.   On: 05/17/2017 14:32   Dg Femur Port Min 2 Views Left  Result Date: 05/17/2017 CLINICAL DATA:  Recent fall with left leg pain, initial encounter EXAM: LEFT FEMUR PORTABLE 2 VIEWS COMPARISON:  Pelvis films from earlier in the same day FINDINGS: Previously seen left pubic rami fractures are incompletely evaluated. The left femur is intact without acute fracture. Degenerative changes of the hip joint and knee joint are seen. Diffuse vascular calcifications are noted. IMPRESSION: Intact femur with degenerative changes in both the hip and knee joint. The known left pubic rami fractures are better visualized on recent pelvis film. Electronically Signed   By: Linus Mako.D.  On: 05/17/2017 19:35   Anti-infectives: Anti-infectives    None     Assessment/Plan Fall Rib fx , left - IS, pulm toilet  Possible acute T1 and T2 compression fractures - pacemaker not compatible with MR machine. Continue c-collar per Dr. Ronnald Ramp. B/l pubic rami fractures - WBAT per Dr. Ninfa Linden R ankle pain - DG ankle  pending Small left pelvic sidewall hematoma  ABL anemia - hgb 11.3 from 11.4, stable, follow  H/o A fib on chronic anticoagulant - Eliquis held. Question whether benefit outweighs risk given age and recurrent falls. Will discuss with MD. Chronic back pain on chronic narcotic - at baseline takes 7.5-325 mg NORCO BID HTN - metoprolol 24 hr tab, 50 mg  Pacemaker GERD  - PPI FEN - SOFT diet; hyponatremia - switch IVF to NS @ 50 cc/hr, BMET in AM.  ID - none VTE - SCD's Pain - scheduled tylenol, oxycodone 5 mg q4h PRN, morphine for breakthrough  Plan: PT/OT consults, pain control, INCENTIVE SPIROMETRY!     LOS: 1 day    [facility] 05/18/2017, 7:46 AM Pager: [phone] Consults: [phone] Mon-Fri 7:00 am-4:30 pm Sat-Sun 7:00 am-11:30 am

## 2017-05-18 NOTE — Evaluation (Addendum)
Occupational Therapy Evaluation Patient Details Name: Darlene Yoder MRN: [id number] DOB: [birth year] Today's Date: 05/18/2017    History of Present Illness 83 yo frail female who sustained a mechanical fall.  Was found to have acute left-sided superior and inferior pubic rami fracture and healed right-sided rami fractures and suspected T1, T2 fractures. Pt with PMH: bilateral knee OA, HTN, GERD, CHF, CKD, A-fib   Clinical Impression   This 83 y/o F presents with the above. Pt lives with her niece and nephew. At baseline she completes functional mobility with RW, niece assists with shower transfers, and Pt receives intermittent supervision for completing ADLs. Pt receives family assistance for IADLs. Pt currently requires MaxA for sit<>stand transfers, with further mobility deferred secondary to pain and safety concerns, requires MaxA for LB ADLs. Pt is very motivated and willing to work with therapy. Pt will benefit from continued acute OT services and recommend ST rehab at SNF for additional OT services prior to return home to reduce caregiver burden and to maximize Pt's safety and independence with ADLs and functional mobility.      Follow Up Recommendations  SNF;Supervision/Assistance - 24 hour    Equipment Recommendations  None recommended by OT           Precautions / Restrictions Precautions Precautions: Fall Required Braces or Orthoses: Cervical Brace Cervical Brace: Hard collar;At all times Restrictions Weight Bearing Restrictions: Yes RLE Weight Bearing: Weight bearing as tolerated LLE Weight Bearing: Weight bearing as tolerated      Mobility Bed Mobility Overal bed mobility: Needs Assistance Bed Mobility: Supine to Sit;Sit to Supine     Supine to sit: Max assist Sit to supine: Max assist   General bed mobility comments: assist for trunk/LE management and increased time as Pt motivated to attempt to complete without assist; +2 assist for scooting to [facility] after return  to supine  Transfers Overall transfer level: Needs assistance Equipment used: None Transfers: Sit to/from Stand Sit to Stand: Max assist         General transfer comment: Pt completed sit<>stand with maxA x1 trial with therapist standing in front of Pt and Pt holding onto therapists elbows; further mobility deferred secondary to pain    Balance Overall balance assessment: Needs assistance Sitting-balance support: Bilateral upper extremity supported Sitting balance-Leahy Scale: Poor Sitting balance - Comments: UE support needed   Standing balance support: Bilateral upper extremity supported Standing balance-Leahy Scale: Zero                             ADL either performed or assessed with clinical judgement   ADL Overall ADL's : Needs assistance/impaired Eating/Feeding: Set up;Sitting   Grooming: Set up;Sitting   Upper Body Bathing: Moderate assistance;Sitting   Lower Body Bathing: Maximal assistance;Sit to/from stand   Upper Body Dressing : Moderate assistance;Sitting   Lower Body Dressing: Maximal assistance;Sit to/from stand Lower Body Dressing Details (indicate cue type and reason): donning/doffing slide on shoes Toilet Transfer: Maximal assistance;+2 for physical assistance;Stand-pivot;BSC   Toileting- Clothing Manipulation and Hygiene: Total assistance;Bed level       Functional mobility during ADLs: Maximal assistance General ADL Comments: Pt sat EOB approx 5 min with close minguard assist for safety/balance, able to complete sit<>stand x1 trial with MaxA, standing approx 30 sec-72min before needing to return to sitting      Vision Baseline Vision/History: Wears glasses Wears Glasses: At all times Vision Assessment?: No apparent visual deficits  Pertinent Vitals/Pain Pain Assessment: Faces Pain Score: 10-Worst pain ever Faces Pain Scale: Hurts even more Pain Location: neck and left chest coming from around back Pain  Descriptors / Indicators: Burning;Aching Pain Intervention(s): Limited activity within patient's tolerance;Monitored during session;Repositioned          Extremity/Trunk Assessment Upper Extremity Assessment Upper Extremity Assessment: LUE deficits/detail;RUE deficits/detail RUE Deficits / Details: Pt shoulder flexion AROM approx 0-125 - unable to reach back of head RUE: Unable to fully assess due to pain LUE Deficits / Details: limited AROM due to pain; Pt reports she typically is able to reach back of head LUE: Unable to fully assess due to pain   Lower Extremity Assessment Lower Extremity Assessment: Defer to PT evaluation   Cervical / Trunk Assessment Cervical / Trunk Assessment: Kyphotic   Communication Communication Communication: No difficulties   Cognition Arousal/Alertness: Awake/alert Behavior During Therapy: WFL for tasks assessed/performed Overall Cognitive Status: Within Functional Limits for tasks assessed                                     General Comments  Pt's neice present during session; reporting her and her husband are unable to provide care necessary for Pt at her current level of assist (reporting they need her to be at her PLOF to return home); educated niece of benefits of ST rehab at SNF to increase Pt's independence and decrease caregiver burden                Home Living Family/patient expects to be discharged to:: Skilled nursing facility Living Arrangements: Other relatives Available Help at Discharge: Family;Available PRN/intermittently Type of Home: House Home Access: Stairs to enter Entrance Stairs-Number of Steps: 4   Home Layout: One level               Home Equipment: Walker - 2 wheels;Bedside commode;Shower seat;Hand held shower head   Additional Comments: pt lives with her niece. Was able to be alone for portions of the day when niece and her husband had to leave.       Prior Functioning/Environment Level  of Independence: Needs assistance  Gait / Transfers Assistance Needed: ambulated with RW ADL's / Homemaking Assistance Needed: niece assisted with cooking, shopping, transfers into/out of walk in shower             OT Problem List: Decreased strength;Impaired balance (sitting and/or standing);Pain;Decreased range of motion;Decreased activity tolerance      OT Treatment/Interventions: Self-care/ADL training;DME and/or AE instruction;Therapeutic activities;Balance training;Therapeutic exercise;Neuromuscular education;Modalities;Energy conservation;Patient/family education    OT Goals(Current goals can be found in the care plan section) Acute Rehab OT Goals Patient Stated Goal: get better and then return home.  OT Goal Formulation: With patient/family Time For Goal Achievement: 07/01/17 Potential to Achieve Goals: Good ADL Goals Pt Will Perform Grooming: with supervision;sitting Pt Will Perform Upper Body Bathing: with supervision;sitting Pt Will Perform Lower Body Dressing: with mod assist;sit to/from stand Pt Will Transfer to Toilet: with mod assist;stand pivot transfer;bedside commode Pt Will Perform Toileting - Clothing Manipulation and hygiene: with mod assist  OT Frequency: Min 2X/week                             AM-PAC PT "6 Clicks" Daily Activity     Outcome Measure Help from another person eating meals?: A Little Help from another person taking care  of personal grooming?: A Little Help from another person toileting, which includes using toliet, bedpan, or urinal?: Total Help from another person bathing (including washing, rinsing, drying)?: A Lot Help from another person to put on and taking off regular upper body clothing?: A Lot Help from another person to put on and taking off regular lower body clothing?: A Lot 6 Click Score: 13   End of Session Nurse Communication: Mobility status  Activity Tolerance: Patient tolerated treatment well Patient left: in  bed;with call bell/phone within reach;with bed alarm set  OT Visit Diagnosis: Unsteadiness on feet (R26.81);Muscle weakness (generalized) (M62.81);History of falling (Z91.81);Pain Pain - part of body:  (neck; left rib cage )                Time: 6815-9470 OT Time Calculation (min): 41 min Charges:  OT General Charges $OT Visit: 1 Procedure OT Evaluation $OT Eval Moderate Complexity: 1 Procedure OT Treatments $Self Care/Home Management : 8-22 mins G-Codes:     Lou Cal, OT Pager [phone] 05/18/2017  Raymondo Band 05/18/2017, 3:58 PM

## 2017-05-18 NOTE — Consult Note (Signed)
Reason for Consult:  Pelvic ring fractures (rami) Referring Physician:   Greer Pickerel, MD - [place] is an 83 y.o. female.  HPI:   83 yo frail female who sustained a mechanical fall.  Was admitted to the Trauma Service.  From an Ortho standpoint, was found to have acute left-sided superior and inferior pubic rami fracture and healed right-sided rami fractures.  Also in a c-collar and spine issues followed by Neurosurgery. Currently resting in bed, but awake and follows commands.  Does report pain "all over".  Very nice person.  Past Medical History:  Diagnosis Date  . Arthritis   . Atrial fibrillation ([place])   . Carcinoma of the skin, basal cell   . CHF (congestive heart failure) ([place])   . Chickenpox   . CKD (chronic kidney disease)   . GERD (gastroesophageal reflux disease)   . Hypertension   . Low sodium levels   . Pacemaker   . Urge incontinence   . UTI (lower urinary tract infection)     Past Surgical History:  Procedure Laterality Date  . ABDOMINAL HYSTERECTOMY    . APPENDECTOMY    . BASAL CELL CARCINOMA EXCISION Left    cheek  . broken pelvis    . BUNIONECTOMY Bilateral   . CARDIAC CATHETERIZATION    . CORONARY ANGIOGRAM  2015   [place]   . CORONARY ANGIOGRAM  2015  . HEMORRHOID SURGERY    . PACEMAKER INSERTION  12/25/2008   [employer] pacemaker DRRF 2210 Serial [id number]  . PACEMAKER PLACEMENT  2005  . PPM GENERATOR CHANGEOUT N/A 01/14/2017   Procedure: PPM Generator Changeout;  Surgeon: Deboraha Sprang, MD;  Location: [facility];  Service: Cardiovascular;  Laterality: N/A;  . SKIN SURGERY     removal of a mass on cheek  . SMALL INTESTINE SURGERY  2016  . TONSILLECTOMY      Family History  Problem Relation Age of Onset  . Heart disease Father   . Stroke Father   . Hypertension Father     Social History:  reports that she has never smoked. She has never used smokeless tobacco. She reports that she does not drink alcohol or use  drugs.  Allergies:  Allergies  Allergen Reactions  . Tetanus Toxoids Rash    Medications: I have reviewed the patient's current medications.  Results for orders placed or performed during the hospital encounter of 05/17/17 (from the past 48 hour(s))  Comprehensive metabolic panel     Status: Abnormal   Collection Time: 05/17/17  6:06 PM  Result Value Ref Range   Sodium 128 (L) 135 - 145 mmol/L   Potassium 4.3 3.5 - 5.1 mmol/L   Chloride 98 (L) 101 - 111 mmol/L   CO2 22 22 - 32 mmol/L   Glucose, Bld 144 (H) 65 - 99 mg/dL   BUN 11 6 - 20 mg/dL   Creatinine, Ser 0.96 0.44 - 1.00 mg/dL   Calcium 9.1 8.9 - 10.3 mg/dL   Total Protein 6.2 (L) 6.5 - 8.1 g/dL   Albumin 3.6 3.5 - 5.0 g/dL   AST 21 15 - 41 U/L   ALT 12 (L) 14 - 54 U/L   Alkaline Phosphatase 52 38 - 126 U/L   Total Bilirubin 1.0 0.3 - 1.2 mg/dL   GFR calc non Af Amer 50 (L) >60 mL/min   GFR calc Af Amer 58 (L) >60 mL/min    Comment: (NOTE) The eGFR has been calculated  using the CKD EPI equation. This calculation has not been validated in all clinical situations. eGFR's persistently <60 mL/min signify possible Chronic Kidney Disease.    Anion gap 8 5 - 15  CBC with Differential/Platelet     Status: Abnormal   Collection Time: 05/17/17  6:06 PM  Result Value Ref Range   WBC 13.8 (H) 4.0 - 10.5 K/uL   RBC 3.78 (L) 3.87 - 5.11 MIL/uL   Hemoglobin 11.4 (L) 12.0 - 15.0 g/dL   HCT 33.9 (L) 36.0 - 46.0 %   MCV 89.7 78.0 - 100.0 fL   MCH 30.2 26.0 - 34.0 pg   MCHC 33.6 30.0 - 36.0 g/dL   RDW 13.4 11.5 - 15.5 %   Platelets 213 150 - 400 K/uL   Neutrophils Relative % 85 %   Neutro Abs 11.7 (H) 1.7 - 7.7 K/uL   Lymphocytes Relative 8 %   Lymphs Abs 1.1 0.7 - 4.0 K/uL   Monocytes Relative 7 %   Monocytes Absolute 0.9 0.1 - 1.0 K/uL   Eosinophils Relative 0 %   Eosinophils Absolute 0.0 0.0 - 0.7 K/uL   Basophils Relative 0 %   Basophils Absolute 0.0 0.0 - 0.1 K/uL  Protime-INR     Status: Abnormal   Collection Time:  05/17/17  6:06 PM  Result Value Ref Range   Prothrombin Time 16.2 (H) 11.4 - 15.2 seconds   INR 1.29   APTT     Status: None   Collection Time: 05/17/17  6:06 PM  Result Value Ref Range   aPTT 29 24 - 36 seconds  Type and screen [place]     Status: None   Collection Time: 05/17/17  6:06 PM  Result Value Ref Range   ABO/RH(D) B POS    Antibody Screen NEG    Sample Expiration 05/20/2017   ABO/Rh     Status: None   Collection Time: 05/17/17  6:06 PM  Result Value Ref Range   ABO/RH(D) B POS   Urinalysis, Routine w reflex microscopic     Status: Abnormal   Collection Time: 05/17/17  6:35 PM  Result Value Ref Range   Color, Urine YELLOW YELLOW   APPearance CLEAR CLEAR   Specific Gravity, Urine 1.021 1.005 - 1.030   pH 5.0 5.0 - 8.0   Glucose, UA NEGATIVE NEGATIVE mg/dL   Hgb urine dipstick NEGATIVE NEGATIVE   Bilirubin Urine NEGATIVE NEGATIVE   Ketones, ur 5 (A) NEGATIVE mg/dL   Protein, ur NEGATIVE NEGATIVE mg/dL   Nitrite NEGATIVE NEGATIVE   Leukocytes, UA NEGATIVE NEGATIVE  CBC     Status: Abnormal   Collection Time: 05/18/17  3:15 AM  Result Value Ref Range   WBC 10.7 (H) 4.0 - 10.5 K/uL   RBC 3.76 (L) 3.87 - 5.11 MIL/uL   Hemoglobin 11.3 (L) 12.0 - 15.0 g/dL   HCT 34.1 (L) 36.0 - 46.0 %   MCV 90.7 78.0 - 100.0 fL   MCH 30.1 26.0 - 34.0 pg   MCHC 33.1 30.0 - 36.0 g/dL   RDW 13.6 11.5 - 15.5 %   Platelets 208 150 - 400 K/uL  Basic metabolic panel     Status: Abnormal   Collection Time: 05/18/17  3:15 AM  Result Value Ref Range   Sodium 131 (L) 135 - 145 mmol/L   Potassium 4.7 3.5 - 5.1 mmol/L   Chloride 100 (L) 101 - 111 mmol/L   CO2 23 22 - 32 mmol/L  Glucose, Bld 135 (H) 65 - 99 mg/dL   BUN 13 6 - 20 mg/dL   Creatinine, Ser 0.96 0.44 - 1.00 mg/dL   Calcium 8.8 (L) 8.9 - 10.3 mg/dL   GFR calc non Af Amer 50 (L) >60 mL/min   GFR calc Af Amer 58 (L) >60 mL/min    Comment: (NOTE) The eGFR has been calculated using the CKD EPI equation. This  calculation has not been validated in all clinical situations. eGFR's persistently <60 mL/min signify possible Chronic Kidney Disease.    Anion gap 8 5 - 15    Dg Ribs Unilateral W/chest Left  Result Date: 05/17/2017 CLINICAL DATA:  Per EMS pt fell at home unwitnessed. Pt states no LOC and no dizziness. Pt states she stood up with her walker and tried to pull her pants up and tripped and fell on her left side. Pt states pain 10/10- mostly left hip and side/shoulder. EXAM: LEFT RIBS AND CHEST - 3+ VIEW COMPARISON:  Chest x-ray dated 10/20/2016. FINDINGS: Cardiomegaly is stable. Atherosclerotic changes noted at the aortic arch. Probable large hiatal hernia. Chronic blunting at the left costophrenic angle, chronic small pleural effusion versus pleural thickening. Lungs otherwise clear. No new lung findings. No pneumothorax seen. Displaced fracture of an upper left anterior-lateral rib, uncertain number. IMPRESSION: 1. Displaced fracture of an upper left-sided rib, anterior-lateral aspect, uncertain number, only seen on 1 of the rib views. 2. Stable cardiomegaly. 3. Probable large hiatal hernia. 4. No new lung findings.  No pneumothorax seen. Electronically Signed   By: Franki Cabot M.D.   On: 05/17/2017 14:29   Dg Thoracic Spine 2 View  Result Date: 05/17/2017 CLINICAL DATA:  Per EMS pt fell at home unwitnessed. Pt states no LOC and no dizziness. Pt states she stood up with her walker and tried to pull her pants up and tripped and fell on her left side. Pt states pain 10/10- mostly left hip and side/shoulder. EXAM: THORACIC SPINE 2 VIEWS COMPARISON:  None. FINDINGS: Marked kyphosis. Chronic appearing compression deformities of a lower thoracic vertebral body and upper lumbar vertebral body. No acute appearing osseous abnormality. IMPRESSION: 1. Chronic appearing compression deformities of a lower thoracic vertebral body and an upper lumbar vertebral body. 2. Marked kyphosis. 3. The compression fracture  deformities of T1 and T2 are better seen on today's cervical spine CT. Please see the CT report. Electronically Signed   By: Franki Cabot M.D.   On: 05/17/2017 14:34   Ct Head Wo Contrast  Result Date: 05/17/2017 CLINICAL DATA:  Unwitnessed fall. EXAM: CT HEAD WITHOUT CONTRAST CT CERVICAL SPINE WITHOUT CONTRAST TECHNIQUE: Multidetector CT imaging of the head and cervical spine was performed following the standard protocol without intravenous contrast. Multiplanar CT image reconstructions of the cervical spine were also generated. COMPARISON:  None. FINDINGS: CT HEAD FINDINGS Brain: Generalized age related parenchymal atrophy with commensurate dilatation of the ventricles and sulci. Mild chronic small vessel ischemic changes in the periventricular white matter. There is no mass, hemorrhage, edema or other evidence of acute parenchymal abnormality. No extra-axial hemorrhage. Vascular: There are chronic calcified atherosclerotic changes of the large vessels at the skull base. No unexpected hyperdense vessel. Skull: Normal. Negative for fracture or focal lesion. Sinuses/Orbits: No acute finding. Other: None. CT CERVICAL SPINE FINDINGS Alignment: Dextroscoliosis of the lower cervical spine. No evidence of acute vertebral body subluxation. Skull base and vertebrae: Compression fracture deformities of the T1 and T2 vertebral bodies, both compressed approximately 50%, both with minimal retropulsion  of the posterior cortex without significant central canal stenosis, both of uncertain age. No fracture line or displaced fracture fragment identified within the cervical spine. Facet joints appear intact and normally aligned throughout. Soft tissues and spinal canal: No prevertebral fluid or swelling. No visible canal hematoma. Disc levels: Disc desiccations throughout the cervical and upper thoracic spine but no more than mild central canal stenosis at any level. Upper chest: No acute findings.  Aortic atherosclerosis.  Other: Carotid atherosclerosis. IMPRESSION: 1. No acute intracranial abnormality. No intracranial mass, hemorrhage or edema. No skull fracture. Atrophy and chronic ischemic changes in white matter. 2. Compression fracture deformities of the T1 and T2 vertebral bodies, both compressed approximately 50%, both with minimal retropulsion of the posterior cortex without significant central canal stenosis, both of uncertain age but more suggestive of acute fractures based on the appearance on sagittal reconstructions. If acute, the involvement of the anterior and posterior portions of the vertebral bodies would be concerning for unstable fractures. No fracture extension is seen into the posterior elements. 3. Degenerative changes throughout the cervical and upper thoracic spine, but no significant central canal stenosis at any level. 4. Aortic atherosclerosis.  Carotid atherosclerosis. These results were called by telephone at the time of interpretation on 05/17/2017 at 2:20 pm to Dr. Lisa Roca , who verbally acknowledged these results. Electronically Signed   By: Franki Cabot M.D.   On: 05/17/2017 14:08   Ct Cervical Spine Wo Contrast  Result Date: 05/17/2017 CLINICAL DATA:  Unwitnessed fall. EXAM: CT HEAD WITHOUT CONTRAST CT CERVICAL SPINE WITHOUT CONTRAST TECHNIQUE: Multidetector CT imaging of the head and cervical spine was performed following the standard protocol without intravenous contrast. Multiplanar CT image reconstructions of the cervical spine were also generated. COMPARISON:  None. FINDINGS: CT HEAD FINDINGS Brain: Generalized age related parenchymal atrophy with commensurate dilatation of the ventricles and sulci. Mild chronic small vessel ischemic changes in the periventricular white matter. There is no mass, hemorrhage, edema or other evidence of acute parenchymal abnormality. No extra-axial hemorrhage. Vascular: There are chronic calcified atherosclerotic changes of the large vessels at the skull  base. No unexpected hyperdense vessel. Skull: Normal. Negative for fracture or focal lesion. Sinuses/Orbits: No acute finding. Other: None. CT CERVICAL SPINE FINDINGS Alignment: Dextroscoliosis of the lower cervical spine. No evidence of acute vertebral body subluxation. Skull base and vertebrae: Compression fracture deformities of the T1 and T2 vertebral bodies, both compressed approximately 50%, both with minimal retropulsion of the posterior cortex without significant central canal stenosis, both of uncertain age. No fracture line or displaced fracture fragment identified within the cervical spine. Facet joints appear intact and normally aligned throughout. Soft tissues and spinal canal: No prevertebral fluid or swelling. No visible canal hematoma. Disc levels: Disc desiccations throughout the cervical and upper thoracic spine but no more than mild central canal stenosis at any level. Upper chest: No acute findings.  Aortic atherosclerosis. Other: Carotid atherosclerosis. IMPRESSION: 1. No acute intracranial abnormality. No intracranial mass, hemorrhage or edema. No skull fracture. Atrophy and chronic ischemic changes in white matter. 2. Compression fracture deformities of the T1 and T2 vertebral bodies, both compressed approximately 50%, both with minimal retropulsion of the posterior cortex without significant central canal stenosis, both of uncertain age but more suggestive of acute fractures based on the appearance on sagittal reconstructions. If acute, the involvement of the anterior and posterior portions of the vertebral bodies would be concerning for unstable fractures. No fracture extension is seen into the posterior elements. 3. Degenerative  changes throughout the cervical and upper thoracic spine, but no significant central canal stenosis at any level. 4. Aortic atherosclerosis.  Carotid atherosclerosis. These results were called by telephone at the time of interpretation on 05/17/2017 at 2:20 pm to Dr.  Lisa Roca , who verbally acknowledged these results. Electronically Signed   By: Franki Cabot M.D.   On: 05/17/2017 14:08   Ct Abdomen Pelvis W Contrast  Result Date: 05/17/2017 CLINICAL DATA:  Ground level fall with pelvic fractures EXAM: CT ABDOMEN AND PELVIS WITH CONTRAST TECHNIQUE: Multidetector CT imaging of the abdomen and pelvis was performed using the standard protocol following bolus administration of intravenous contrast. CONTRAST:  181m ISOVUE-300 IOPAMIDOL (ISOVUE-300) INJECTION 61% COMPARISON:  04/11/2017, 05/17/2017, 10/08/2016 FINDINGS: Lower chest: Patchy dependent atelectasis within the posterior right lung base. Trace left pleural effusion. Large hiatal hernia. Cardiomegaly with partially visualized pacing leads. Coronary artery calcifications. Hepatobiliary: Moderate to marked intra and extrahepatic biliary dilatation, slightly progressed since the previous exam, the extrahepatic bile duct measures up to 16 mm. Small calcified stones in the gallbladder. Possible small focus of fat infiltration near the falciform ligament. Pancreas: No inflammation.  Diffuse atrophy. Spleen: Normal in size without focal abnormality. Adrenals/Urinary Tract: Adrenal glands are within normal limits. Mildly atrophic kidneys. No hydronephrosis. The bladder is unremarkable Stomach/Bowel: Stomach is nonenlarged. No dilated small bowel. No colon wall thickening. Moderate to large amount of stool within the colon Vascular/Lymphatic: Extensive atherosclerotic vascular calcification. No aneurysmal dilatation. No significantly enlarged lymph nodes Reproductive: Status post hysterectomy. No adnexal masses. Other: No free air or free fluid. Musculoskeletal: Old right inferior and superior pubic rami fractures. Acute, mildly displaced and comminuted left inferior and superior pubic rami fractures with extension to the pubic symphysis. No significant widening. Small left pelvic sidewall hematoma and small amount of edema/  hematoma around the left pubic symphysis. Old compression deformities of L2 and L5. Extensive degenerative changes IMPRESSION: 1. No CT evidence for acute solid organ injury or free air 2. Acute, mildly displaced and comminuted left inferior and superior pubic rami fractures with extension to the left pubic symphysis. No significant widening of the symphysis. Small left pelvic sidewall hematoma and small amount of edema/hematoma around the left pubic symphysis. 3. Old compression fractures of L2 and L5 4. Moderate intra and extrahepatic biliary dilatation, increased compared to prior and again concerning for distal obstructing process. Gallstones 5. Large hiatal hernia Electronically Signed   By: KDonavan FoilM.D.   On: 05/17/2017 20:56   Dg Chest Port 1 View  Result Date: 05/17/2017 CLINICAL DATA:  Recent fall EXAM: PORTABLE CHEST 1 VIEW COMPARISON:  05/17/2017 FINDINGS: Cardiac shadow remains enlarged. Large hiatal hernia is again seen. No pneumothorax is noted. The known left rib fracture is not well appreciated on this exam. The lungs are clear. No other focal abnormality is noted. Pacing device is again seen and stable. IMPRESSION: No acute abnormality noted. Chronic changes as described. Electronically Signed   By: MInez CatalinaM.D.   On: 05/17/2017 19:39   Dg Hip Unilat W Or Wo Pelvis 2-3 Views Left  Result Date: 05/17/2017 CLINICAL DATA:  Unwitnessed fall. EXAM: DG HIP (WITH OR WITHOUT PELVIS) 2-3V LEFT COMPARISON:  None. FINDINGS: Single view of the pelvis and two views of the left hip are provided. Displaced fractures of the left superior and inferior pubic rami, with probable involvement of the symphysis pubis. Probable displaced fracture within the lateral aspect of the right superior pubic ramus. Remainder of the osseous  pelvis appears intact and normally aligned, although portions of the sacrum are obscured by overlying bowel gas. Both femoral heads appear appropriately positioned relative to  the acetabuli. No fracture seen within the femoral heads or femoral necks. IMPRESSION: 1. Displaced fractures of the left pubic rami, with probable involvement of the left symphysis pubis. 2. Probable displaced fracture within the right superior pubic ramus. 3. No fracture or dislocation at the left hip. Electronically Signed   By: Franki Cabot M.D.   On: 05/17/2017 14:32   Dg Femur Port Min 2 Views Left  Result Date: 05/17/2017 CLINICAL DATA:  Recent fall with left leg pain, initial encounter EXAM: LEFT FEMUR PORTABLE 2 VIEWS COMPARISON:  Pelvis films from earlier in the same day FINDINGS: Previously seen left pubic rami fractures are incompletely evaluated. The left femur is intact without acute fracture. Degenerative changes of the hip joint and knee joint are seen. Diffuse vascular calcifications are noted. IMPRESSION: Intact femur with degenerative changes in both the hip and knee joint. The known left pubic rami fractures are better visualized on recent pelvis film. Electronically Signed   By: Inez Catalina M.D.   On: 05/17/2017 19:35   I independently reviewed her plain films and pelvic CT scan an have seen her left-sided superior and inferior pubic rami fraqctures that are acute.  ROS Blood pressure 132/62, pulse 70, temperature 98.9 F (37.2 C), temperature source Oral, resp. rate 16, height 5' (1.524 m), weight 116 lb 3.6 oz (52.7 kg), SpO2 94 %. Physical Exam She will move both of her feet and ankles for me and her feet are well-perfused.  Her pelvis is tender to AP compression and to palpation over her pubis.  Responds to pain with attempts to move her left hip.    Assessment/Plan: Left-side pelvic ring (rami) fractures  The pelvic fractures can be treated with observation and mobility/weight-bearing as tolerated.    Mcarthur Rossetti 05/18/2017, 6:26 AM

## 2017-05-18 NOTE — Progress Notes (Signed)
Patient ID: Darlene Yoder, female   DOB: Aug 31, 1922, 83 y.o.   MRN: [id number] Subjective: Patient reports groin pain and pain across shoulders, no arm pain or NTW, in collar  Objective: Vital signs in last 24 hours: Temp:  [98.2 F (36.8 C)-99.2 F (37.3 C)] 98.9 F (37.2 C) (06/25 0400) Pulse Rate:  [70-72] 70 (06/25 0400) Resp:  [13-21] 16 (06/25 0400) BP: (104-160)/(61-78) 132/62 (06/25 0400) SpO2:  [94 %-100 %] 94 % (06/25 0400) Weight:  [52.6 kg (116 lb)-52.7 kg (116 lb 3.6 oz)] 52.7 kg (116 lb 3.6 oz) (06/24 2100)  Intake/Output from previous day: 06/24 0701 - 06/25 0700 In: 250 [I.V.:250] Out: 200 [Urine:200] Intake/Output this shift: No intake/output data recorded.  Neurologic: Alert and oriented X 3, normal strength and tone. Normal symmetric reflexes. Normal coordination and gait  Lab Results: Lab Results  Component Value Date   WBC 10.7 (H) 05/18/2017   HGB 11.3 (L) 05/18/2017   HCT 34.1 (L) 05/18/2017   MCV 90.7 05/18/2017   PLT 208 05/18/2017   Lab Results  Component Value Date   INR 1.29 05/17/2017   BMET Lab Results  Component Value Date   NA 131 (L) 05/18/2017   K 4.7 05/18/2017   CL 100 (L) 05/18/2017   CO2 23 05/18/2017   GLUCOSE 135 (H) 05/18/2017   BUN 13 05/18/2017   CREATININE 0.96 05/18/2017   CALCIUM 8.8 (L) 05/18/2017    Studies/Results: Dg Ribs Unilateral W/chest Left  Result Date: 05/17/2017 CLINICAL DATA:  Per EMS pt fell at home unwitnessed. Pt states no LOC and no dizziness. Pt states she stood up with her walker and tried to pull her pants up and tripped and fell on her left side. Pt states pain 10/10- mostly left hip and side/shoulder. EXAM: LEFT RIBS AND CHEST - 3+ VIEW COMPARISON:  Chest x-ray dated 10/20/2016. FINDINGS: Cardiomegaly is stable. Atherosclerotic changes noted at the aortic arch. Probable large hiatal hernia. Chronic blunting at the left costophrenic angle, chronic small pleural effusion versus pleural thickening.  Lungs otherwise clear. No new lung findings. No pneumothorax seen. Displaced fracture of an upper left anterior-lateral rib, uncertain number. IMPRESSION: 1. Displaced fracture of an upper left-sided rib, anterior-lateral aspect, uncertain number, only seen on 1 of the rib views. 2. Stable cardiomegaly. 3. Probable large hiatal hernia. 4. No new lung findings.  No pneumothorax seen. Electronically Signed   By: Franki Cabot M.D.   On: 05/17/2017 14:29   Dg Thoracic Spine 2 View  Result Date: 05/17/2017 CLINICAL DATA:  Per EMS pt fell at home unwitnessed. Pt states no LOC and no dizziness. Pt states she stood up with her walker and tried to pull her pants up and tripped and fell on her left side. Pt states pain 10/10- mostly left hip and side/shoulder. EXAM: THORACIC SPINE 2 VIEWS COMPARISON:  None. FINDINGS: Marked kyphosis. Chronic appearing compression deformities of a lower thoracic vertebral body and upper lumbar vertebral body. No acute appearing osseous abnormality. IMPRESSION: 1. Chronic appearing compression deformities of a lower thoracic vertebral body and an upper lumbar vertebral body. 2. Marked kyphosis. 3. The compression fracture deformities of T1 and T2 are better seen on today's cervical spine CT. Please see the CT report. Electronically Signed   By: Franki Cabot M.D.   On: 05/17/2017 14:34   Ct Head Wo Contrast  Result Date: 05/17/2017 CLINICAL DATA:  Unwitnessed fall. EXAM: CT HEAD WITHOUT CONTRAST CT CERVICAL SPINE WITHOUT CONTRAST TECHNIQUE: Multidetector CT imaging of  the head and cervical spine was performed following the standard protocol without intravenous contrast. Multiplanar CT image reconstructions of the cervical spine were also generated. COMPARISON:  None. FINDINGS: CT HEAD FINDINGS Brain: Generalized age related parenchymal atrophy with commensurate dilatation of the ventricles and sulci. Mild chronic small vessel ischemic changes in the periventricular white matter. There  is no mass, hemorrhage, edema or other evidence of acute parenchymal abnormality. No extra-axial hemorrhage. Vascular: There are chronic calcified atherosclerotic changes of the large vessels at the skull base. No unexpected hyperdense vessel. Skull: Normal. Negative for fracture or focal lesion. Sinuses/Orbits: No acute finding. Other: None. CT CERVICAL SPINE FINDINGS Alignment: Dextroscoliosis of the lower cervical spine. No evidence of acute vertebral body subluxation. Skull base and vertebrae: Compression fracture deformities of the T1 and T2 vertebral bodies, both compressed approximately 50%, both with minimal retropulsion of the posterior cortex without significant central canal stenosis, both of uncertain age. No fracture line or displaced fracture fragment identified within the cervical spine. Facet joints appear intact and normally aligned throughout. Soft tissues and spinal canal: No prevertebral fluid or swelling. No visible canal hematoma. Disc levels: Disc desiccations throughout the cervical and upper thoracic spine but no more than mild central canal stenosis at any level. Upper chest: No acute findings.  Aortic atherosclerosis. Other: Carotid atherosclerosis. IMPRESSION: 1. No acute intracranial abnormality. No intracranial mass, hemorrhage or edema. No skull fracture. Atrophy and chronic ischemic changes in white matter. 2. Compression fracture deformities of the T1 and T2 vertebral bodies, both compressed approximately 50%, both with minimal retropulsion of the posterior cortex without significant central canal stenosis, both of uncertain age but more suggestive of acute fractures based on the appearance on sagittal reconstructions. If acute, the involvement of the anterior and posterior portions of the vertebral bodies would be concerning for unstable fractures. No fracture extension is seen into the posterior elements. 3. Degenerative changes throughout the cervical and upper thoracic spine, but  no significant central canal stenosis at any level. 4. Aortic atherosclerosis.  Carotid atherosclerosis. These results were called by telephone at the time of interpretation on 05/17/2017 at 2:20 pm to Dr. Lisa Roca , who verbally acknowledged these results. Electronically Signed   By: Franki Cabot M.D.   On: 05/17/2017 14:08   Ct Cervical Spine Wo Contrast  Result Date: 05/17/2017 CLINICAL DATA:  Unwitnessed fall. EXAM: CT HEAD WITHOUT CONTRAST CT CERVICAL SPINE WITHOUT CONTRAST TECHNIQUE: Multidetector CT imaging of the head and cervical spine was performed following the standard protocol without intravenous contrast. Multiplanar CT image reconstructions of the cervical spine were also generated. COMPARISON:  None. FINDINGS: CT HEAD FINDINGS Brain: Generalized age related parenchymal atrophy with commensurate dilatation of the ventricles and sulci. Mild chronic small vessel ischemic changes in the periventricular white matter. There is no mass, hemorrhage, edema or other evidence of acute parenchymal abnormality. No extra-axial hemorrhage. Vascular: There are chronic calcified atherosclerotic changes of the large vessels at the skull base. No unexpected hyperdense vessel. Skull: Normal. Negative for fracture or focal lesion. Sinuses/Orbits: No acute finding. Other: None. CT CERVICAL SPINE FINDINGS Alignment: Dextroscoliosis of the lower cervical spine. No evidence of acute vertebral body subluxation. Skull base and vertebrae: Compression fracture deformities of the T1 and T2 vertebral bodies, both compressed approximately 50%, both with minimal retropulsion of the posterior cortex without significant central canal stenosis, both of uncertain age. No fracture line or displaced fracture fragment identified within the cervical spine. Facet joints appear intact and normally aligned throughout.  Soft tissues and spinal canal: No prevertebral fluid or swelling. No visible canal hematoma. Disc levels: Disc  desiccations throughout the cervical and upper thoracic spine but no more than mild central canal stenosis at any level. Upper chest: No acute findings.  Aortic atherosclerosis. Other: Carotid atherosclerosis. IMPRESSION: 1. No acute intracranial abnormality. No intracranial mass, hemorrhage or edema. No skull fracture. Atrophy and chronic ischemic changes in white matter. 2. Compression fracture deformities of the T1 and T2 vertebral bodies, both compressed approximately 50%, both with minimal retropulsion of the posterior cortex without significant central canal stenosis, both of uncertain age but more suggestive of acute fractures based on the appearance on sagittal reconstructions. If acute, the involvement of the anterior and posterior portions of the vertebral bodies would be concerning for unstable fractures. No fracture extension is seen into the posterior elements. 3. Degenerative changes throughout the cervical and upper thoracic spine, but no significant central canal stenosis at any level. 4. Aortic atherosclerosis.  Carotid atherosclerosis. These results were called by telephone at the time of interpretation on 05/17/2017 at 2:20 pm to Dr. Lisa Roca , who verbally acknowledged these results. Electronically Signed   By: Franki Cabot M.D.   On: 05/17/2017 14:08   Ct Abdomen Pelvis W Contrast  Result Date: 05/17/2017 CLINICAL DATA:  Ground level fall with pelvic fractures EXAM: CT ABDOMEN AND PELVIS WITH CONTRAST TECHNIQUE: Multidetector CT imaging of the abdomen and pelvis was performed using the standard protocol following bolus administration of intravenous contrast. CONTRAST:  143mL ISOVUE-300 IOPAMIDOL (ISOVUE-300) INJECTION 61% COMPARISON:  04/11/2017, 05/17/2017, 10/08/2016 FINDINGS: Lower chest: Patchy dependent atelectasis within the posterior right lung base. Trace left pleural effusion. Large hiatal hernia. Cardiomegaly with partially visualized pacing leads. Coronary artery  calcifications. Hepatobiliary: Moderate to marked intra and extrahepatic biliary dilatation, slightly progressed since the previous exam, the extrahepatic bile duct measures up to 16 mm. Small calcified stones in the gallbladder. Possible small focus of fat infiltration near the falciform ligament. Pancreas: No inflammation.  Diffuse atrophy. Spleen: Normal in size without focal abnormality. Adrenals/Urinary Tract: Adrenal glands are within normal limits. Mildly atrophic kidneys. No hydronephrosis. The bladder is unremarkable Stomach/Bowel: Stomach is nonenlarged. No dilated small bowel. No colon wall thickening. Moderate to large amount of stool within the colon Vascular/Lymphatic: Extensive atherosclerotic vascular calcification. No aneurysmal dilatation. No significantly enlarged lymph nodes Reproductive: Status post hysterectomy. No adnexal masses. Other: No free air or free fluid. Musculoskeletal: Old right inferior and superior pubic rami fractures. Acute, mildly displaced and comminuted left inferior and superior pubic rami fractures with extension to the pubic symphysis. No significant widening. Small left pelvic sidewall hematoma and small amount of edema/ hematoma around the left pubic symphysis. Old compression deformities of L2 and L5. Extensive degenerative changes IMPRESSION: 1. No CT evidence for acute solid organ injury or free air 2. Acute, mildly displaced and comminuted left inferior and superior pubic rami fractures with extension to the left pubic symphysis. No significant widening of the symphysis. Small left pelvic sidewall hematoma and small amount of edema/hematoma around the left pubic symphysis. 3. Old compression fractures of L2 and L5 4. Moderate intra and extrahepatic biliary dilatation, increased compared to prior and again concerning for distal obstructing process. Gallstones 5. Large hiatal hernia Electronically Signed   By: Donavan Foil M.D.   On: 05/17/2017 20:56   Dg Chest Port  1 View  Result Date: 05/17/2017 CLINICAL DATA:  Recent fall EXAM: PORTABLE CHEST 1 VIEW COMPARISON:  05/17/2017 FINDINGS: Cardiac  shadow remains enlarged. Large hiatal hernia is again seen. No pneumothorax is noted. The known left rib fracture is not well appreciated on this exam. The lungs are clear. No other focal abnormality is noted. Pacing device is again seen and stable. IMPRESSION: No acute abnormality noted. Chronic changes as described. Electronically Signed   By: Inez Catalina M.D.   On: 05/17/2017 19:39   Dg Hip Unilat W Or Wo Pelvis 2-3 Views Left  Result Date: 05/17/2017 CLINICAL DATA:  Unwitnessed fall. EXAM: DG HIP (WITH OR WITHOUT PELVIS) 2-3V LEFT COMPARISON:  None. FINDINGS: Single view of the pelvis and two views of the left hip are provided. Displaced fractures of the left superior and inferior pubic rami, with probable involvement of the symphysis pubis. Probable displaced fracture within the lateral aspect of the right superior pubic ramus. Remainder of the osseous pelvis appears intact and normally aligned, although portions of the sacrum are obscured by overlying bowel gas. Both femoral heads appear appropriately positioned relative to the acetabuli. No fracture seen within the femoral heads or femoral necks. IMPRESSION: 1. Displaced fractures of the left pubic rami, with probable involvement of the left symphysis pubis. 2. Probable displaced fracture within the right superior pubic ramus. 3. No fracture or dislocation at the left hip. Electronically Signed   By: Franki Cabot M.D.   On: 05/17/2017 14:32   Dg Femur Port Min 2 Views Left  Result Date: 05/17/2017 CLINICAL DATA:  Recent fall with left leg pain, initial encounter EXAM: LEFT FEMUR PORTABLE 2 VIEWS COMPARISON:  Pelvis films from earlier in the same day FINDINGS: Previously seen left pubic rami fractures are incompletely evaluated. The left femur is intact without acute fracture. Degenerative changes of the hip joint and  knee joint are seen. Diffuse vascular calcifications are noted. IMPRESSION: Intact femur with degenerative changes in both the hip and knee joint. The known left pubic rami fractures are better visualized on recent pelvis film. Electronically Signed   By: Inez Catalina M.D.   On: 05/17/2017 19:35    Assessment/Plan: Continue collar for suspected T1T2 fxs   LOS: 1 day    Stellarose Cerny S 05/18/2017, 7:49 AM

## 2017-05-19 ENCOUNTER — Encounter
Admission: RE | Admit: 2017-05-19 | Discharge: 2017-05-19 | Disposition: A | Discharge status: Home / Self Care | Payer: PPO | Source: Ambulatory Visit | Attending: Internal Medicine | Admitting: Internal Medicine

## 2017-05-19 ENCOUNTER — Other Ambulatory Visit: Payer: Self-pay | Admitting: Family Medicine

## 2017-05-19 LAB — CBC
HCT: 28.7 % — ABNORMAL LOW (ref 36.0–46.0)
HEMOGLOBIN: 9.4 g/dL — AB (ref 12.0–15.0)
MCH: 30 pg (ref 26.0–34.0)
MCHC: 32.8 g/dL (ref 30.0–36.0)
MCV: 91.7 fL (ref 78.0–100.0)
Platelets: 161 10*3/uL (ref 150–400)
RBC: 3.13 MIL/uL — ABNORMAL LOW (ref 3.87–5.11)
RDW: 14.1 % (ref 11.5–15.5)
WBC: 8.9 10*3/uL (ref 4.0–10.5)

## 2017-05-19 LAB — BASIC METABOLIC PANEL
ANION GAP: 6 (ref 5–15)
BUN: 16 mg/dL (ref 6–20)
CHLORIDE: 103 mmol/L (ref 101–111)
CO2: 23 mmol/L (ref 22–32)
Calcium: 8.4 mg/dL — ABNORMAL LOW (ref 8.9–10.3)
Creatinine, Ser: 1.01 mg/dL — ABNORMAL HIGH (ref 0.44–1.00)
GFR calc Af Amer: 54 mL/min — ABNORMAL LOW (ref >60)
GFR calc non Af Amer: 47 mL/min — ABNORMAL LOW (ref >60)
GLUCOSE: 105 mg/dL — AB (ref 65–99)
POTASSIUM: 4.5 mmol/L (ref 3.5–5.1)
Sodium: 132 mmol/L — ABNORMAL LOW (ref 135–145)

## 2017-05-19 LAB — CBC WITH DIFFERENTIAL/PLATELET
Basophils Absolute: 0 10*3/uL (ref 0.0–0.1)
Basophils Relative: 0 %
EOS ABS: 0 10*3/uL (ref 0.0–0.7)
EOS PCT: 0 %
HCT: 32.8 % — ABNORMAL LOW (ref 36.0–46.0)
Hemoglobin: 10.5 g/dL — ABNORMAL LOW (ref 12.0–15.0)
LYMPHS ABS: 1 10*3/uL (ref 0.7–4.0)
Lymphocytes Relative: 10 %
MCH: 29.7 pg (ref 26.0–34.0)
MCHC: 32 g/dL (ref 30.0–36.0)
MCV: 92.9 fL (ref 78.0–100.0)
MONOS PCT: 9 %
Monocytes Absolute: 1 10*3/uL (ref 0.1–1.0)
Neutro Abs: 8.6 10*3/uL — ABNORMAL HIGH (ref 1.7–7.7)
Neutrophils Relative %: 81 %
PLATELETS: 182 10*3/uL (ref 150–400)
RBC: 3.53 MIL/uL — ABNORMAL LOW (ref 3.87–5.11)
RDW: 13.9 % (ref 11.5–15.5)
WBC: 10.6 10*3/uL — ABNORMAL HIGH (ref 4.0–10.5)

## 2017-05-19 MED ORDER — APIXABAN 2.5 MG PO TABS
2.5000 mg | ORAL_TABLET | Freq: Two times a day (BID) | ORAL | Status: DC
  Administered 2017-05-19 – 2017-05-20 (×2): 2.5 mg via ORAL
  Filled 2017-05-19 (×2): qty 1

## 2017-05-19 MED ORDER — ACETAMINOPHEN 325 MG PO TABS: 650.0000 mg | ORAL_TABLET | Freq: Four times a day (QID) | ORAL | Status: DC

## 2017-05-19 MED ORDER — OXYCODONE HCL 5 MG PO TABS
5.0000 mg | ORAL_TABLET | ORAL | Status: DC | PRN
Start: 2017-05-19 — End: 2017-05-20
  Administered 2017-05-19 – 2017-05-20 (×5): 5 mg via ORAL
  Filled 2017-05-19 (×6): qty 1

## 2017-05-19 MED ORDER — DOCUSATE SODIUM 100 MG PO CAPS: 100.0000 mg | ORAL_CAPSULE | Freq: Two times a day (BID) | ORAL | 0 refills | Status: DC

## 2017-05-19 MED ORDER — BOOST / RESOURCE BREEZE PO LIQD
1.0000 | Freq: Three times a day (TID) | ORAL | Status: DC
  Administered 2017-05-19: 1 via ORAL

## 2017-05-19 MED ORDER — OXYCODONE HCL 5 MG PO TABS: 5.0000 mg | ORAL_TABLET | ORAL | Status: DC | PRN

## 2017-05-19 NOTE — Progress Notes (Signed)
[facility] Progress Note     Subjective: CC:  Pain over left anterior and lateral chest wall 2/2 rib FX. Denies significant pain at rest but having pain with coughing/laughing and mobilizing with therapies. Pulling <500 cc on IS. Tolerating PO. Reports better PO intake today compared to yesterday.  Objective: Vital signs in last 24 hours: Temp:  [97.3 F (36.3 C)-98.4 F (36.9 C)] 97.4 F (36.3 C) (06/26 0451) Pulse Rate:  [44-72] 44 (06/26 0451) Resp:  [18] 18 (06/26 0451) BP: (90-121)/(45-55) 96/55 (06/26 0451) SpO2:  [94 %-98 %] 98 % (06/26 0451)    Intake/Output from previous day: 06/25 0701 - 06/26 0700 In: 600 [P.O.:600] Out: 150 [Urine:150] Intake/Output this shift: No intake/output data recorded.  PE: Gen:  Alert, NAD, pleasant HEENT: c-collar in place, pupils equal and round, EOM's in tact  Card:  Regular rate and rhythm, pedal pulses 2+ BL Pulm:  Normal effort, clear to auscultation bilaterally with diminished breath sounds in bilateral lung  Abd: Soft, non-tender, non-distended, bowel sounds present in all 4 quadrants Skin: warm and dry, no rashes  Psych: A&Ox3   Lab Results:   Recent Labs  05/18/17 0315 05/19/17 0517  WBC 10.7* 8.9  HGB 11.3* 9.4*  HCT 34.1* 28.7*  PLT 208 161   BMET  Recent Labs  05/18/17 0315 05/19/17 0517  NA 131* 132*  K 4.7 4.5  CL 100* 103  CO2 23 23  GLUCOSE 135* 105*  BUN 13 16  CREATININE 0.96 1.01*  CALCIUM 8.8* 8.4*   PT/INR  Recent Labs  05/17/17 1806  LABPROT 16.2*  INR 1.29   CMP     Component Value Date/Time   NA 132 (L) 05/19/2017 0517   K 4.5 05/19/2017 0517   CL 103 05/19/2017 0517   CO2 23 05/19/2017 0517   GLUCOSE 105 (H) 05/19/2017 0517   BUN 16 05/19/2017 0517   CREATININE 1.01 (H) 05/19/2017 0517   CALCIUM 8.4 (L) 05/19/2017 0517   PROT 6.2 (L) 05/17/2017 1806   ALBUMIN 3.6 05/17/2017 1806   AST 21 05/17/2017 1806   ALT 12 (L) 05/17/2017 1806   ALKPHOS 52 05/17/2017  1806   BILITOT 1.0 05/17/2017 1806   GFRNONAA 47 (L) 05/19/2017 0517   GFRAA 54 (L) 05/19/2017 0517   Lipase     Component Value Date/Time   LIPASE 18 04/11/2017 0106       Studies/Results: Dg Ribs Unilateral W/chest Left  Result Date: 05/17/2017 CLINICAL DATA:  Per EMS pt fell at home unwitnessed. Pt states no LOC and no dizziness. Pt states she stood up with her walker and tried to pull her pants up and tripped and fell on her left side. Pt states pain 10/10- mostly left hip and side/shoulder. EXAM: LEFT RIBS AND CHEST - 3+ VIEW COMPARISON:  Chest x-ray dated 10/20/2016. FINDINGS: Cardiomegaly is stable. Atherosclerotic changes noted at the aortic arch. Probable large hiatal hernia. Chronic blunting at the left costophrenic angle, chronic small pleural effusion versus pleural thickening. Lungs otherwise clear. No new lung findings. No pneumothorax seen. Displaced fracture of an upper left anterior-lateral rib, uncertain number. IMPRESSION: 1. Displaced fracture of an upper left-sided rib, anterior-lateral aspect, uncertain number, only seen on 1 of the rib views. 2. Stable cardiomegaly. 3. Probable large hiatal hernia. 4. No new lung findings.  No pneumothorax seen. Electronically Signed   By: Franki Cabot M.D.   On: 05/17/2017 14:29   Dg Thoracic Spine 2 View  Result Date:  05/17/2017 CLINICAL DATA:  Per EMS pt fell at home unwitnessed. Pt states no LOC and no dizziness. Pt states she stood up with her walker and tried to pull her pants up and tripped and fell on her left side. Pt states pain 10/10- mostly left hip and side/shoulder. EXAM: THORACIC SPINE 2 VIEWS COMPARISON:  None. FINDINGS: Marked kyphosis. Chronic appearing compression deformities of a lower thoracic vertebral body and upper lumbar vertebral body. No acute appearing osseous abnormality. IMPRESSION: 1. Chronic appearing compression deformities of a lower thoracic vertebral body and an upper lumbar vertebral body. 2. Marked  kyphosis. 3. The compression fracture deformities of T1 and T2 are better seen on today's cervical spine CT. Please see the CT report. Electronically Signed   By: Franki Cabot M.D.   On: 05/17/2017 14:34   Dg Ankle 2 Views Right  Result Date: 05/18/2017 CLINICAL DATA:  Fall yesterday EXAM: RIGHT ANKLE - 2 VIEW COMPARISON:  None. FINDINGS: There is no oblique view. This limits the examination. There is no obvious fracture or dislocation. Osteopenia. Spurring at the posterior and inferior calcaneus is minimal. IMPRESSION: Limited exam.  No obvious acute injury.  Chronic changes. Electronically Signed   By: Marybelle Killings M.D.   On: 05/18/2017 10:10   Ct Head Wo Contrast  Result Date: 05/17/2017 CLINICAL DATA:  Unwitnessed fall. EXAM: CT HEAD WITHOUT CONTRAST CT CERVICAL SPINE WITHOUT CONTRAST TECHNIQUE: Multidetector CT imaging of the head and cervical spine was performed following the standard protocol without intravenous contrast. Multiplanar CT image reconstructions of the cervical spine were also generated. COMPARISON:  None. FINDINGS: CT HEAD FINDINGS Brain: Generalized age related parenchymal atrophy with commensurate dilatation of the ventricles and sulci. Mild chronic small vessel ischemic changes in the periventricular white matter. There is no mass, hemorrhage, edema or other evidence of acute parenchymal abnormality. No extra-axial hemorrhage. Vascular: There are chronic calcified atherosclerotic changes of the large vessels at the skull base. No unexpected hyperdense vessel. Skull: Normal. Negative for fracture or focal lesion. Sinuses/Orbits: No acute finding. Other: None. CT CERVICAL SPINE FINDINGS Alignment: Dextroscoliosis of the lower cervical spine. No evidence of acute vertebral body subluxation. Skull base and vertebrae: Compression fracture deformities of the T1 and T2 vertebral bodies, both compressed approximately 50%, both with minimal retropulsion of the posterior cortex without  significant central canal stenosis, both of uncertain age. No fracture line or displaced fracture fragment identified within the cervical spine. Facet joints appear intact and normally aligned throughout. Soft tissues and spinal canal: No prevertebral fluid or swelling. No visible canal hematoma. Disc levels: Disc desiccations throughout the cervical and upper thoracic spine but no more than mild central canal stenosis at any level. Upper chest: No acute findings.  Aortic atherosclerosis. Other: Carotid atherosclerosis. IMPRESSION: 1. No acute intracranial abnormality. No intracranial mass, hemorrhage or edema. No skull fracture. Atrophy and chronic ischemic changes in white matter. 2. Compression fracture deformities of the T1 and T2 vertebral bodies, both compressed approximately 50%, both with minimal retropulsion of the posterior cortex without significant central canal stenosis, both of uncertain age but more suggestive of acute fractures based on the appearance on sagittal reconstructions. If acute, the involvement of the anterior and posterior portions of the vertebral bodies would be concerning for unstable fractures. No fracture extension is seen into the posterior elements. 3. Degenerative changes throughout the cervical and upper thoracic spine, but no significant central canal stenosis at any level. 4. Aortic atherosclerosis.  Carotid atherosclerosis. These results were called by  telephone at the time of interpretation on 05/17/2017 at 2:20 pm to Dr. Lisa Roca , who verbally acknowledged these results. Electronically Signed   By: Franki Cabot M.D.   On: 05/17/2017 14:08   Ct Cervical Spine Wo Contrast  Result Date: 05/17/2017 CLINICAL DATA:  Unwitnessed fall. EXAM: CT HEAD WITHOUT CONTRAST CT CERVICAL SPINE WITHOUT CONTRAST TECHNIQUE: Multidetector CT imaging of the head and cervical spine was performed following the standard protocol without intravenous contrast. Multiplanar CT image  reconstructions of the cervical spine were also generated. COMPARISON:  None. FINDINGS: CT HEAD FINDINGS Brain: Generalized age related parenchymal atrophy with commensurate dilatation of the ventricles and sulci. Mild chronic small vessel ischemic changes in the periventricular white matter. There is no mass, hemorrhage, edema or other evidence of acute parenchymal abnormality. No extra-axial hemorrhage. Vascular: There are chronic calcified atherosclerotic changes of the large vessels at the skull base. No unexpected hyperdense vessel. Skull: Normal. Negative for fracture or focal lesion. Sinuses/Orbits: No acute finding. Other: None. CT CERVICAL SPINE FINDINGS Alignment: Dextroscoliosis of the lower cervical spine. No evidence of acute vertebral body subluxation. Skull base and vertebrae: Compression fracture deformities of the T1 and T2 vertebral bodies, both compressed approximately 50%, both with minimal retropulsion of the posterior cortex without significant central canal stenosis, both of uncertain age. No fracture line or displaced fracture fragment identified within the cervical spine. Facet joints appear intact and normally aligned throughout. Soft tissues and spinal canal: No prevertebral fluid or swelling. No visible canal hematoma. Disc levels: Disc desiccations throughout the cervical and upper thoracic spine but no more than mild central canal stenosis at any level. Upper chest: No acute findings.  Aortic atherosclerosis. Other: Carotid atherosclerosis. IMPRESSION: 1. No acute intracranial abnormality. No intracranial mass, hemorrhage or edema. No skull fracture. Atrophy and chronic ischemic changes in white matter. 2. Compression fracture deformities of the T1 and T2 vertebral bodies, both compressed approximately 50%, both with minimal retropulsion of the posterior cortex without significant central canal stenosis, both of uncertain age but more suggestive of acute fractures based on the  appearance on sagittal reconstructions. If acute, the involvement of the anterior and posterior portions of the vertebral bodies would be concerning for unstable fractures. No fracture extension is seen into the posterior elements. 3. Degenerative changes throughout the cervical and upper thoracic spine, but no significant central canal stenosis at any level. 4. Aortic atherosclerosis.  Carotid atherosclerosis. These results were called by telephone at the time of interpretation on 05/17/2017 at 2:20 pm to Dr. Lisa Roca , who verbally acknowledged these results. Electronically Signed   By: Franki Cabot M.D.   On: 05/17/2017 14:08   Ct Abdomen Pelvis W Contrast  Result Date: 05/17/2017 CLINICAL DATA:  Ground level fall with pelvic fractures EXAM: CT ABDOMEN AND PELVIS WITH CONTRAST TECHNIQUE: Multidetector CT imaging of the abdomen and pelvis was performed using the standard protocol following bolus administration of intravenous contrast. CONTRAST:  163mL ISOVUE-300 IOPAMIDOL (ISOVUE-300) INJECTION 61% COMPARISON:  04/11/2017, 05/17/2017, 10/08/2016 FINDINGS: Lower chest: Patchy dependent atelectasis within the posterior right lung base. Trace left pleural effusion. Large hiatal hernia. Cardiomegaly with partially visualized pacing leads. Coronary artery calcifications. Hepatobiliary: Moderate to marked intra and extrahepatic biliary dilatation, slightly progressed since the previous exam, the extrahepatic bile duct measures up to 16 mm. Small calcified stones in the gallbladder. Possible small focus of fat infiltration near the falciform ligament. Pancreas: No inflammation.  Diffuse atrophy. Spleen: Normal in size without focal abnormality. Adrenals/Urinary Tract:  Adrenal glands are within normal limits. Mildly atrophic kidneys. No hydronephrosis. The bladder is unremarkable Stomach/Bowel: Stomach is nonenlarged. No dilated small bowel. No colon wall thickening. Moderate to large amount of stool within the  colon Vascular/Lymphatic: Extensive atherosclerotic vascular calcification. No aneurysmal dilatation. No significantly enlarged lymph nodes Reproductive: Status post hysterectomy. No adnexal masses. Other: No free air or free fluid. Musculoskeletal: Old right inferior and superior pubic rami fractures. Acute, mildly displaced and comminuted left inferior and superior pubic rami fractures with extension to the pubic symphysis. No significant widening. Small left pelvic sidewall hematoma and small amount of edema/ hematoma around the left pubic symphysis. Old compression deformities of L2 and L5. Extensive degenerative changes IMPRESSION: 1. No CT evidence for acute solid organ injury or free air 2. Acute, mildly displaced and comminuted left inferior and superior pubic rami fractures with extension to the left pubic symphysis. No significant widening of the symphysis. Small left pelvic sidewall hematoma and small amount of edema/hematoma around the left pubic symphysis. 3. Old compression fractures of L2 and L5 4. Moderate intra and extrahepatic biliary dilatation, increased compared to prior and again concerning for distal obstructing process. Gallstones 5. Large hiatal hernia Electronically Signed   By: Donavan Foil M.D.   On: 05/17/2017 20:56   Dg Chest Port 1 View  Result Date: 05/17/2017 CLINICAL DATA:  Recent fall EXAM: PORTABLE CHEST 1 VIEW COMPARISON:  05/17/2017 FINDINGS: Cardiac shadow remains enlarged. Large hiatal hernia is again seen. No pneumothorax is noted. The known left rib fracture is not well appreciated on this exam. The lungs are clear. No other focal abnormality is noted. Pacing device is again seen and stable. IMPRESSION: No acute abnormality noted. Chronic changes as described. Electronically Signed   By: Inez Catalina M.D.   On: 05/17/2017 19:39   Dg Hip Unilat W Or Wo Pelvis 2-3 Views Left  Result Date: 05/17/2017 CLINICAL DATA:  Unwitnessed fall. EXAM: DG HIP (WITH OR WITHOUT  PELVIS) 2-3V LEFT COMPARISON:  None. FINDINGS: Single view of the pelvis and two views of the left hip are provided. Displaced fractures of the left superior and inferior pubic rami, with probable involvement of the symphysis pubis. Probable displaced fracture within the lateral aspect of the right superior pubic ramus. Remainder of the osseous pelvis appears intact and normally aligned, although portions of the sacrum are obscured by overlying bowel gas. Both femoral heads appear appropriately positioned relative to the acetabuli. No fracture seen within the femoral heads or femoral necks. IMPRESSION: 1. Displaced fractures of the left pubic rami, with probable involvement of the left symphysis pubis. 2. Probable displaced fracture within the right superior pubic ramus. 3. No fracture or dislocation at the left hip. Electronically Signed   By: Franki Cabot M.D.   On: 05/17/2017 14:32   Dg Femur Port Min 2 Views Left  Result Date: 05/17/2017 CLINICAL DATA:  Recent fall with left leg pain, initial encounter EXAM: LEFT FEMUR PORTABLE 2 VIEWS COMPARISON:  Pelvis films from earlier in the same day FINDINGS: Previously seen left pubic rami fractures are incompletely evaluated. The left femur is intact without acute fracture. Degenerative changes of the hip joint and knee joint are seen. Diffuse vascular calcifications are noted. IMPRESSION: Intact femur with degenerative changes in both the hip and knee joint. The known left pubic rami fractures are better visualized on recent pelvis film. Electronically Signed   By: Inez Catalina M.D.   On: 05/17/2017 19:35    Anti-infectives: Anti-infectives  None     Assessment/Plan Fall Rib fx , left - IS, pulm toilet  Possible acute T1 and T2 compression fractures - pacemaker not compatible with MR machine. Continue c-collar per Dr. Ronnald Ramp. B/l pubic rami fractures - WBAT per Dr. Ninfa Linden R ankle pain - DG ankle negative for acute fracture  Small left pelvic  sidewall hematoma  ABL anemia - hgb 9.4, follow  H/o A fib on chronic anticoagulant - Eliquis held. Question whether benefit outweighs risk given age and recurrent falls. Will discuss with MD. Chronic back pain on chronic narcotic - at baseline takes 7.5-325 mg NORCO BID  HTN - metoprolol 24 hr tab, 50 mg  Pacemaker GERD  - PPI  FEN - full liquid diet; hyponatremia (132) - continue NS @ 50 cc/hr ID - none VTE - SCD's Pain - scheduled tylenol, oxycodone 5 mg q4h PRN, morphine for breakthrough  Plan: continue therapies, follow up CBC this afternoon, SNF placement  Anticipate discharge to [facility] tomorrow     LOS: 2 days    Jill Alexanders , [facility] 05/19/2017, 7:57 AM Pager: [phone] Consults: [phone] Mon-Fri 7:00 am-4:30 pm Sat-Sun 7:00 am-11:30 am

## 2017-05-19 NOTE — Progress Notes (Signed)
Physical Therapy Treatment Patient Details Name: Darlene Yoder MRN: [id number] DOB: 1922/05/16 Today's Date: 05/19/2017    History of Present Illness 83 yo frail female who sustained a mechanical fall.  Was found to have acute left-sided superior and inferior pubic rami fracture and healed right-sided rami fractures and suspected T1, T2 fractures. Pt with PMH: bilateral knee OA, HTN, GERD, CHF, CKD, A-fib    PT Comments    Pt performed increased mobility and advanced to short gait trial from bed to chair.  Pt required +1 assistance during tx today.  Pt remains anxious and fearful but able to progress mobility.  Plan next session for progression of gait training.  Informed nursing to reapply pure wick external cath post tx.  Plan for SNF remains appropriate.     Follow Up Recommendations  SNF;Supervision/Assistance - 24 hour     Equipment Recommendations  None recommended by PT    Recommendations for Other Services       Precautions / Restrictions Precautions Precautions: Fall Required Braces or Orthoses: Cervical Brace Cervical Brace: Hard collar;At all times Restrictions Weight Bearing Restrictions: No RLE Weight Bearing: Weight bearing as tolerated LLE Weight Bearing: Weight bearing as tolerated    Mobility  Bed Mobility Overal bed mobility: Needs Assistance Bed Mobility: Supine to Sit;Sit to Supine     Supine to sit: Max assist Sit to supine: Max assist   General bed mobility comments: Pt with improved ability to advance to edge of bed.  Pt required assist to advance B LEs to edge of bed, and assist to elevate trunk into sitting.  Pt able to follow commands to use rail.    Transfers Overall transfer level: Needs assistance Equipment used: Rolling walker (2 wheeled) Transfers: Sit to/from Stand Sit to Stand: Mod assist         General transfer comment: Cues for hand placement to and from seated surface.  Pt required a boost to elevate into standing.     Ambulation/Gait Ambulation/Gait assistance: Mod assist Ambulation Distance (Feet): 6 Feet Assistive device: Rolling walker (2 wheeled) Gait Pattern/deviations: Step-through pattern;Decreased stride length;Shuffle;Trunk flexed     General Gait Details: Pt presents with kyphotic posturing during gait training.  This appears to be her baseline.  pt required assist for R foot clearance and step during steps from bed to chair.  Pt fearful of falling and anxious to sit back down.     Stairs            Wheelchair Mobility    Modified Rankin (Stroke Patients Only)       Balance     Sitting balance-Leahy Scale: Fair       Standing balance-Leahy Scale: Poor                              Cognition Arousal/Alertness: Awake/alert Behavior During Therapy: WFL for tasks assessed/performed Overall Cognitive Status: Within Functional Limits for tasks assessed                                        Exercises General Exercises - Lower Extremity Ankle Circles/Pumps: AROM;Both;10 reps;Supine Quad Sets: AROM;Both;10 reps;Supine Gluteal Sets: AROM;Both;10 reps;Supine Long Arc Quad: AROM;Both;10 reps;Seated Hip ABduction/ADduction: Both;10 reps;AROM;Seated (pillow squeeze only for hip adduction.  No abduction performed.  isometric hold into adduction.  ) Hip Flexion/Marching: AROM;Both;10 reps;Seated  General Comments        Pertinent Vitals/Pain Pain Assessment: Faces Pain Score: 5  Pain Location: neck and left chest coming from around back Pain Descriptors / Indicators: Burning;Aching Pain Intervention(s): Monitored during session;Repositioned    Home Living                      Prior Function            PT Goals (current goals can now be found in the care plan section) Acute Rehab PT Goals Patient Stated Goal: get better and then return home.  Potential to Achieve Goals: Good Progress towards PT goals: Progressing toward  goals    Frequency    Min 3X/week      PT Plan Current plan remains appropriate    Co-evaluation              AM-PAC PT "6 Clicks" Daily Activity  Outcome Measure  Difficulty turning over in bed (including adjusting bedclothes, sheets and blankets)?: Total Difficulty moving from lying on back to sitting on the side of the bed? : Total Difficulty sitting down on and standing up from a chair with arms (e.g., wheelchair, bedside commode, etc,.)?: Total Help needed moving to and from a bed to chair (including a wheelchair)?: A Lot Help needed walking in hospital room?: A Lot Help needed climbing 3-5 steps with a railing? : Total 6 Click Score: 8    End of Session Equipment Utilized During Treatment: Gait belt Activity Tolerance: Patient limited by pain Patient left: in bed;with call bell/phone within reach;with bed alarm set;with family/visitor present Nurse Communication: Mobility status PT Visit Diagnosis: Muscle weakness (generalized) (M62.81);Pain;Unsteadiness on feet (R26.81);Other abnormalities of gait and mobility (R26.89);History of falling (Z91.81) Pain - Right/Left: Left Pain - part of body:  (pelvis, neck and chest.  )     Time: 2563-8937 PT Time Calculation (min) (ACUTE ONLY): 26 min  Charges:  $Therapeutic Exercise: 8-22 mins $Therapeutic Activity: 8-22 mins                    G Codes:       Darlene Yoder, PTA pager [phone]    Darlene Yoder 05/19/2017, 11:22 AM

## 2017-05-19 NOTE — Progress Notes (Signed)
CSW following to facilitate discharge to [facility]. CSW confirmed bed availability with Sharyn Lull at [facility], and able to transfer today if medically ready. CSW informed patient and family.  CSW initiated insurance authorization through [facility], will follow up with authorization information when received.  Laveda Abbe, [place] Clinical Social Worker [phone]

## 2017-05-19 NOTE — Discharge Summary (Signed)
[facility] Discharge Summary   Patient ID: Darlene Yoder MRN: [id number] DOB/AGE: 1922-06-23 83 y.o.  Admit date: 05/17/2017 Discharge date: 05/20/2017 Discharge Diagnosis Patient Active Problem List   Diagnosis Date Noted  . Multiple closed pelvic fractures without disruption of pelvic circle (HCC)   . T1/T2 vertebral fracture    . Rib fracture, left    . Fall 05/17/2017  . Postherpetic neuralgia 03/20/2017  . Bowel habit changes 03/20/2017  . Complete heart block ([place]) 01/14/2017  . Dysuria 12/24/2016  . DDD (degenerative disc disease), lumbar 07/03/2016  . Facet syndrome, lumbar ([place]) 07/03/2016  . DDD (degenerative disc disease), thoracic 07/03/2016  . Thoracic facet syndrome ([place]) 07/03/2016  . Scoliosis (and kyphoscoliosis), idiopathic 07/03/2016  . Sacroiliac joint dysfunction 07/03/2016  . DDD (degenerative disc disease), cervical 07/03/2016  . DJD (degenerative joint disease) of knee 07/03/2016  . DJD of shoulder 07/03/2016  . GERD (gastroesophageal reflux disease) 03/09/2016  . Dry skin 03/09/2016  . Hyponatremia 01/08/2016  . Presence of permanent cardiac pacemaker 12/07/2015  . Atrial fibrillation ([place]) 12/05/2015  . History of basal cell carcinoma of skin 12/05/2015  . Chronic pain syndrome 12/05/2015  . Congenital blocked tear duct 12/05/2015  . Essential hypertension 12/05/2015  . Urge incontinence 12/05/2015   Consultants Orthopedic surgery - Dr. Coralie Keens  Neurosurgery - Dr. Sherley Bounds   Imaging: Dg Ankle 2 Views Right  Result Date: 05/18/2017 CLINICAL DATA:  Fall yesterday EXAM: RIGHT ANKLE - 2 VIEW COMPARISON:  None. FINDINGS: There is no oblique view. This limits the examination. There is no obvious fracture or dislocation. Osteopenia. Spurring at the posterior and inferior calcaneus is minimal. IMPRESSION: Limited exam.  No obvious acute injury.  Chronic changes. Electronically Signed   By: Marybelle Killings M.D.   On: 05/18/2017 10:10     Procedures None   Hospital Course:  Darlene Yoder is a 83 y.o. Female with a PMH a.fib on eliquis, HTN, chronic backpain, and permanent pacemaker who presented to [facility] after a ground level fall.  Denies LOC. ED workup sigificant for T1/T2 fracture, single left rib fracture, and pubic rami fractures. The patient was admitted for further treatment, observation, and pain control. Eliquis was held secondary to traumatic injury. Orthopedic surgery recommended non-operative treatment of fracture and made pt WBAT on BLLE. Prior to the event, patient ambulated with a rolling walker and was independent of most ADLs with some assistance with showering. Therapies recommended she be discharged to a SNF for further therapy and assistance prior to returning home. Patient experienced some acute on chronic anemia as expected 2/2 traumatic injuries, however she did not require any blood products. On 6/26 Eliquis was resumed. On 05/20/17 the patients vitals were stable, pain controlled, mobilizing with therapies, tolerating PO, and clinically stable for discharge to a SNF. She will require outpatient follow up with neurosurgery in 4 weeks (Dr. Ronnald Ramp) and orthopedic surgery on an as needed basis(Dr. Ninfa Linden). She will require c-collar remain in place for 3 months. It may be removed for showers.   Physical Exam: Gen:  Alert, NAD, pleasant HEENT: c-collar in place, pupils equal and round, EOM's in tact  Card:  Regular rate and rhythm, pedal pulses 2+ BL Pulm:  Normal effort, clear to auscultation bilaterally with diminished breath sounds in bilateral lung bases, no wheezes, rales, or rhonchi  Abd: Soft, non-tender, non-distended, bowel sounds present in all 4 quadrants Skin: warm and dry, no rashes  Psych: A&Ox3   Allergies as of 05/20/2017  Reactions   Tetanus Toxoids Rash      Medication List    STOP taking these medications   HYDROcodone-acetaminophen 7.5-325 MG tablet Commonly known as:  NORCO      TAKE these medications   acetaminophen 325 MG tablet Commonly known as:  TYLENOL Take 2 tablets (650 mg total) by mouth every 6 (six) hours.   beta carotene w/minerals tablet Take 1 tablet by mouth daily.   Calcium 600-200 MG-UNIT tablet Take 1 tablet by mouth daily.   docusate sodium 100 MG capsule Commonly known as:  COLACE Take 1 capsule (100 mg total) by mouth 2 (two) times daily.   ELIQUIS 2.5 MG Tabs tablet Generic drug:  apixaban TAKE ONE TABLET BY MOUTH TWICE DAILY   feeding supplement Liqd Take 1 Container by mouth 3 (three) times daily between meals.   metoprolol succinate 50 MG 24 hr tablet Commonly known as:  TOPROL-XL TAKE 1 TABLET (50 MG TOTAL) BY MOUTH DAILY. TAKE WITH OR IMMEDIATELY FOLLOWING A MEAL.   multivitamin with minerals Tabs tablet Take 1 tablet by mouth daily. Centrum Silver   ondansetron 4 MG tablet Commonly known as:  ZOFRAN Take 1 tablet (4 mg total) by mouth every 6 (six) hours as needed for nausea.   oxybutynin 5 MG tablet Commonly known as:  DITROPAN TAKE 1 TABLET (5 MG TOTAL) BY MOUTH 2 (TWO) TIMES DAILY.   oxyCODONE 5 MG immediate release tablet Commonly known as:  Oxy IR/ROXICODONE Take 1 tablet (5 mg total) by mouth every 4 (four) hours as needed for moderate pain.   pantoprazole 40 MG tablet Commonly known as:  PROTONIX TAKE 1 TABLET EVERY DAY What changed:  See the new instructions.   polyethylene glycol powder powder Commonly known as:  GLYCOLAX/MIRALAX TAKE 17 G BY MOUTH 2 (TWO) TIMES DAILY AS NEEDED. What changed:  See the new instructions.   REFRESH OP Place 1 drop into both eyes daily.   sodium chloride 0.65 % Soln nasal spray Commonly known as:  OCEAN Place 1 spray into both nostrils 2 (two) times daily as needed for congestion.   traZODone 50 MG tablet Commonly known as:  DESYREL TAKE 1 TABLET AT BEDTIME What changed:  See the new instructions.         Contact information for follow-up providers     Mcarthur Rossetti, MD. Call.   Specialty:  Orthopedic Surgery Why:  as needed with questions/concerns regarding pelvic fractures. Contact information: [place] [address] [phone]        Eustace Moore, MD. Schedule an appointment as soon as possible for a visit in 4 week(s).   Specialty:  Neurosurgery Why:  for follow up regarding thoracic spine fractures  Contact information: [address] [address] [phone]        [place] [facility]. Call.   Why:  as needed Contact information: [place] [address] [phone]           Contact information for after-discharge care    Destination    [facility] .   Specialty:  [place] information: [place] [phone]                  Signed: Obie Dredge, [facility] 05/20/2017, 8:25 AM Pager: [phone] Consults: [phone] Mon-Fri 7:00 am-4:30 pm Sat-Sun 7:00 am-11:30 am

## 2017-05-19 NOTE — Progress Notes (Signed)
ANTICOAGULATION CONSULT NOTE - Initial Consult  Pharmacy Consult for Eliquis Indication: atrial fibrillation  Allergies  Allergen Reactions  . Tetanus Toxoids Rash    Patient Measurements: Height: 5' (152.4 cm) Weight: 116 lb 3.6 oz (52.7 kg) IBW/kg (Calculated) : 45.5  Vital Signs: Temp: 98.1 F (36.7 C) (06/26 1344) Temp Source: Oral (06/26 1344) BP: 127/67 (06/26 1344) Pulse Rate: 70 (06/26 1344)  Labs:  Recent Labs  05/17/17 1806 05/18/17 0315 05/19/17 0517 05/19/17 1417  HGB 11.4* 11.3* 9.4* 10.5*  HCT 33.9* 34.1* 28.7* 32.8*  PLT 213 208 161 182  APTT 29  --   --   --   LABPROT 16.2*  --   --   --   INR 1.29  --   --   --   CREATININE 0.96 0.96 1.01*  --     Estimated Creatinine Clearance: 25.5 mL/min (A) (by C-G formula based on SCr of 1.01 mg/dL (H)).   Assessment: 89 YOF on Eliquis 2.5mg  BID PTA for AFib- was on hold, and now to restart. Meets requirements for dose reduction based on age and weight. Was on prophylactic Lovenox 30mg  subQ q24h- last dose given this morning @ 0922.  Hgb 10.5, plts 182- no bleeding noted.  Goal of Therapy:    Monitor platelets by anticoagulation protocol: Yes   Plan:  -start Eliquis 2.5mg  PO BID tonight -CBC in the AM -follow for s/s bleeding   Jiyan Walkowski D. Dior Stepter, PharmD, BCPS Clinical Pharmacist [phone] 05/19/2017 3:51 PM

## 2017-05-19 NOTE — Progress Notes (Signed)
Initial Nutrition Assessment  DOCUMENTATION CODES:   Not applicable  INTERVENTION:    Boost Breeze po TID (clear liquid), each supplement provides 250 kcal and 9 grams of protein  NUTRITION DIAGNOSIS:   Increased nutrient needs related to  (healing & recovery) as evidenced by estimated needs  GOAL:   Patient will meet greater than or equal to 90% of their needs  MONITOR:   PO intake, Supplement acceptance, Labs, Weight trends, Skin, I & O's  REASON FOR ASSESSMENT:   Consult Poor PO  ASSESSMENT:    "83 yo Female who sustained a mechanical fall.  Was admitted to the Trauma Service.  From an Ortho standpoint, was found to have acute left-sided superior and inferior pubic rami fracture and healed right-sided rami fractures." Copied from Orthopedics note, Mcarthur Rossetti, MD, 05/17/17.  Pt reports her appetite is getting better. PO intake 50-75% per flowsheet records. Doesn't like Boost and/or Ensure supplements.  Medications reviewed. Labs reviewed. Na 132 (L). Weight has been stable.  Unable to complete Nutrition-Focused physical exam at this time.   Diet Order:  Diet full liquid Room service appropriate? Yes; Fluid consistency: Thin  Skin:  Reviewed, no issues  Last BM:  N/A  Height:   Ht Readings from Last 1 Encounters:  05/17/17 5' (1.524 m)   Weight:   Wt Readings from Last 1 Encounters:  05/17/17 116 lb 3.6 oz (52.7 kg)   Wt Readings from Last 10 Encounters:  05/17/17 116 lb 3.6 oz (52.7 kg)  05/17/17 116 lb (52.6 kg)  04/21/17 116 lb 8 oz (52.8 kg)  04/11/17 105 lb (47.6 kg)  03/20/17 115 lb 12.8 oz (52.5 kg)  02/25/17 117 lb 6.4 oz (53.3 kg)  01/14/17 115 lb (52.2 kg)  12/24/16 113 lb 12.8 oz (51.6 kg)  12/16/16 118 lb 8 oz (53.8 kg)  10/23/16 118 lb (53.5 kg)   Ideal Body Weight:  45.4 kg  BMI:  Body mass index is 22.7 kg/m.  Estimated Nutritional Needs:   Kcal:  1300-1500  Protein:  60-70 gm  Fluid:  >/= 1.5 L  EDUCATION  NEEDS:   No education needs identified at this time  Arthur Holms, RD, LDN Pager #: [phone] After-Hours Pager #: [phone]

## 2017-05-19 NOTE — Clinical Social Work Note (Signed)
Clinical Social Work Assessment  Patient Details  Name: Darlene Yoder MRN: [id number] Date of Birth: Oct 24, 1922  Date of referral:  05/18/17               Reason for consult:  Facility Placement, Discharge Planning                Permission sought to share information with:  Facility Sport and exercise psychologist, Family Supports Permission granted to share information::  Yes, Verbal Permission Granted  Name::     Darlene Yoder  Agency::  SNF  Relationship::  Niece, Great Niece  Sport and exercise psychologist Information:     Housing/Transportation Living arrangements for the past 2 months:  Single Family Home Source of Information:  Other (Comment Required) Patient Interpreter Needed:  None Criminal Activity/Legal Involvement Pertinent to Current Situation/Hospitalization:  No - Comment as needed Significant Relationships:  Other Family Members Lives with:  Self, Relatives Do you feel safe going back to the place where you live?  Yes Need for family participation in patient care:  No (Coment)  Care giving concerns:  Patient currently lives at home with niece and family, who are available for support part of the time. Patient will need rehab placement to improve functional mobility prior to returning home.   Social Worker assessment / plan:  CSW introduced self to patient and niece, Darlene Yoder, and explained role. Patient's niece expressed concerns about specific SNF placements due to past experience, and preference for [facility] or [place]. Patient's niece asked questions about insurance coverage and referral process, and CSW explained and answered questions. Patient's niece contacted her daughter, Darlene Yoder, over phone during discussion to make sure there were no other questions. Patient's great niece reiterated the concerns about specific SNF placements and the desire to make sure that the patient receives the best care. CSW assured the patient and family that they would be informed of every step of the process. CSW will  follow to facilitate discharge planning to SNF when medically appropriate.  Employment status:  Retired Nurse, adult PT Recommendations:  [place] / Referral to community resources:  [place]  Patient/Family's Response to care:  Patient and family agreeable to SNF placement.  Patient/Family's Understanding of and Emotional Response to Diagnosis, Current Treatment, and Prognosis:  Patient and family seem to understand current functional limitations as a result of the patient's fall. Patient's niece expressed concern about the care that the patient would receive at SNF because the family had a bad experience in the past that led to a death of a family member. Patient's niece and great niece expressed anxiety about the decision, and ultimately that they want the patient to be able to safely come home. Patient and family indicated understanding of CSW role in discharge planning.  Emotional Assessment Appearance:  Appears stated age Attitude/Demeanor/Rapport:    Affect (typically observed):  Appropriate Orientation:  Oriented to Situation, Oriented to  Time, Oriented to Place, Oriented to Self Alcohol / Substance use:  Not Applicable Psych involvement (Current and /or in the community):  No (Comment)  Discharge Needs  Concerns to be addressed:  Care Coordination, Discharge Planning Concerns Readmission within the last 30 days:  No Current discharge risk:  Physical Impairment Barriers to Discharge:  Continued Medical Work up   [employer], [place] 05/19/2017, 8:56 AM

## 2017-05-20 DIAGNOSIS — Z950 Presence of cardiac pacemaker: Secondary | ICD-10-CM | POA: Diagnosis not present

## 2017-05-20 DIAGNOSIS — S32501A Unspecified fracture of right pubis, initial encounter for closed fracture: Secondary | ICD-10-CM | POA: Diagnosis not present

## 2017-05-20 DIAGNOSIS — R1311 Dysphagia, oral phase: Secondary | ICD-10-CM | POA: Diagnosis not present

## 2017-05-20 DIAGNOSIS — H04123 Dry eye syndrome of bilateral lacrimal glands: Secondary | ICD-10-CM | POA: Diagnosis not present

## 2017-05-20 DIAGNOSIS — S2232XD Fracture of one rib, left side, subsequent encounter for fracture with routine healing: Secondary | ICD-10-CM | POA: Diagnosis not present

## 2017-05-20 DIAGNOSIS — S3282XD Multiple fractures of pelvis without disruption of pelvic ring, subsequent encounter for fracture with routine healing: Secondary | ICD-10-CM | POA: Diagnosis not present

## 2017-05-20 DIAGNOSIS — Z7901 Long term (current) use of anticoagulants: Secondary | ICD-10-CM | POA: Diagnosis not present

## 2017-05-20 DIAGNOSIS — S12100A Unspecified displaced fracture of second cervical vertebra, initial encounter for closed fracture: Secondary | ICD-10-CM | POA: Diagnosis not present

## 2017-05-20 DIAGNOSIS — N3941 Urge incontinence: Secondary | ICD-10-CM | POA: Diagnosis not present

## 2017-05-20 DIAGNOSIS — I10 Essential (primary) hypertension: Secondary | ICD-10-CM | POA: Diagnosis not present

## 2017-05-20 DIAGNOSIS — M4120 Other idiopathic scoliosis, site unspecified: Secondary | ICD-10-CM | POA: Diagnosis not present

## 2017-05-20 DIAGNOSIS — S22019D Unspecified fracture of first thoracic vertebra, subsequent encounter for fracture with routine healing: Secondary | ICD-10-CM | POA: Diagnosis not present

## 2017-05-20 DIAGNOSIS — R102 Pelvic and perineal pain: Secondary | ICD-10-CM | POA: Diagnosis not present

## 2017-05-20 DIAGNOSIS — K219 Gastro-esophageal reflux disease without esophagitis: Secondary | ICD-10-CM | POA: Diagnosis not present

## 2017-05-20 DIAGNOSIS — M6281 Muscle weakness (generalized): Secondary | ICD-10-CM | POA: Diagnosis not present

## 2017-05-20 DIAGNOSIS — G8929 Other chronic pain: Secondary | ICD-10-CM | POA: Diagnosis not present

## 2017-05-20 DIAGNOSIS — R262 Difficulty in walking, not elsewhere classified: Secondary | ICD-10-CM | POA: Diagnosis not present

## 2017-05-20 DIAGNOSIS — Z9181 History of falling: Secondary | ICD-10-CM | POA: Diagnosis not present

## 2017-05-20 DIAGNOSIS — S2232XA Fracture of one rib, left side, initial encounter for closed fracture: Secondary | ICD-10-CM | POA: Diagnosis not present

## 2017-05-20 DIAGNOSIS — I4891 Unspecified atrial fibrillation: Secondary | ICD-10-CM | POA: Diagnosis not present

## 2017-05-20 DIAGNOSIS — S22029D Unspecified fracture of second thoracic vertebra, subsequent encounter for fracture with routine healing: Secondary | ICD-10-CM | POA: Diagnosis not present

## 2017-05-20 DIAGNOSIS — S32502A Unspecified fracture of left pubis, initial encounter for closed fracture: Secondary | ICD-10-CM | POA: Diagnosis not present

## 2017-05-20 LAB — CBC
HEMATOCRIT: 28.8 % — AB (ref 36.0–46.0)
Hemoglobin: 9.5 g/dL — ABNORMAL LOW (ref 12.0–15.0)
MCH: 30.4 pg (ref 26.0–34.0)
MCHC: 33 g/dL (ref 30.0–36.0)
MCV: 92.3 fL (ref 78.0–100.0)
Platelets: 176 10*3/uL (ref 150–400)
RBC: 3.12 MIL/uL — AB (ref 3.87–5.11)
RDW: 14.1 % (ref 11.5–15.5)
WBC: 9.1 10*3/uL (ref 4.0–10.5)

## 2017-05-20 MED ORDER — OXYCODONE HCL 5 MG PO TABS: 5.0000 mg | ORAL_TABLET | ORAL | 0 refills | Status: DC | PRN

## 2017-05-20 MED ORDER — BOOST / RESOURCE BREEZE PO LIQD: 1.0000 | Freq: Three times a day (TID) | ORAL | 0 refills | Status: DC

## 2017-05-20 NOTE — Progress Notes (Signed)
Discharge to: [facility] Anticipated discharge date: 05/20/17 Family notified: Yes, at bedside Transportation by: [facility]  Report #: [phone], Room [address]B  [place] signing off.  Laveda Abbe LCSW [phone]

## 2017-05-20 NOTE — Discharge Instructions (Signed)
CONTINUE TO TAKE TYLENOL AND OXYCODONE FOR PAIN AS NEEDED. CONTINUE TO USE INCENTIVE SPIROMETER EVERY 1-2 HOURS.   Rib Fracture A rib fracture is a break or crack in one of the bones of the ribs. The ribs are a group of long, curved bones that wrap around your chest and attach to your spine. They protect your lungs and other organs in the chest cavity. A broken or cracked rib is often painful, but most do not cause other problems. Most rib fractures heal on their own over time. However, rib fractures can be more serious if multiple ribs are broken or if broken ribs move out of place and push against other structures. What are the causes?  A direct blow to the chest. For example, this could happen during contact sports, a car accident, or a fall against a hard object.  Repetitive movements with high force, such as pitching a baseball or having severe coughing spells. What are the signs or symptoms?  Pain when you breathe in or cough.  Pain when someone presses on the injured area. How is this diagnosed? Your caregiver will perform a physical exam. Various imaging tests may be ordered to confirm the diagnosis and to look for related injuries. These tests may include a chest X-ray, computed tomography (CT), magnetic resonance imaging (MRI), or a bone scan. How is this treated? Rib fractures usually heal on their own in 1-3 months. The longer healing period is often associated with a continued cough or other aggravating activities. During the healing period, pain control is very important. Medication is usually given to control pain. Hospitalization or surgery may be needed for more severe injuries, such as those in which multiple ribs are broken or the ribs have moved out of place. Follow these instructions at home:  Avoid strenuous activity and any activities or movements that cause pain. Be careful during activities and avoid bumping the injured rib.  Gradually increase activity as directed by  your caregiver.  Only take over-the-counter or prescription medications as directed by your caregiver. Do not take other medications without asking your caregiver first.  Apply ice to the injured area for the first 1-2 days after you have been treated or as directed by your caregiver. Applying ice helps to reduce inflammation and pain. ? Put ice in a plastic bag. ? Place a towel between your skin and the bag. ? Leave the ice on for 15-20 minutes at a time, every 2 hours while you are awake.  Perform deep breathing as directed by your caregiver. This will help prevent pneumonia, which is a common complication of a broken rib. Your caregiver may instruct you to: ? Take deep breaths several times a day. ? Try to cough several times a day, holding a pillow against the injured area. ? Use a device called an incentive spirometer to practice deep breathing several times a day.  Drink enough fluids to keep your urine clear or pale yellow. This will help you avoid constipation.  Do not wear a rib belt or binder. These restrict breathing, which can lead to pneumonia. Get help right away if:  You have a fever.  You have difficulty breathing or shortness of breath.  You develop a continual cough, or you cough up thick or bloody sputum.  You feel sick to your stomach (nausea), throw up (vomit), or have abdominal pain.  You have worsening pain not controlled with medications. This information is not intended to replace advice given to you by your  health care provider. Make sure you discuss any questions you have with your health care provider. Document Released: 11/10/2005 Document Revised: 04/17/2016 Document Reviewed: 01/12/2013 Elsevier Interactive Patient Education  [employer].

## 2017-05-20 NOTE — Progress Notes (Signed)
Physical Therapy Treatment Patient Details Name: Darlene Yoder MRN: [id number] DOB: [birth year] Today's Date: 05/20/2017    History of Present Illness 83 yo frail female who sustained a mechanical fall.  Was found to have acute left-sided superior and inferior pubic rami fracture and healed right-sided rami fractures and suspected T1, T2 fractures. Pt with PMH: bilateral knee OA, HTN, GERD, CHF, CKD, A-fib    PT Comments    CSW asked PT to see pt before d/c today per PA's request.  PT obliged and pt is doing worse today than yesterday, weaker, increased pain, increased difficulty taking steps to get to the chair.  We will try for 45 mins in the chair and then PT will return.  An hour yesterday, per niece, was too long and staff had significant difficulty getting her back into the bed.  I will try the standing frame to get her back.    Follow Up Recommendations  SNF;Supervision/Assistance - 24 hour     Equipment Recommendations  None recommended by PT    Recommendations for Other Services   NA     Precautions / Restrictions Precautions Precautions: Fall Required Braces or Orthoses: Cervical Brace Cervical Brace: Hard collar;At all times Restrictions RLE Weight Bearing: Weight bearing as tolerated LLE Weight Bearing: Weight bearing as tolerated    Mobility  Bed Mobility Overal bed mobility: Needs Assistance Bed Mobility: Supine to Sit;Rolling Rolling: Mod assist   Supine to sit: Mod assist;HOB elevated     General bed mobility comments: Pt attempting to help with hands and assisting in progressing bil legs to EOB.   Transfers Overall transfer level: Needs assistance Equipment used: Rolling walker (2 wheeled) Transfers: Sit to/from Stand Sit to Stand: Mod assist         General transfer comment: heavy mod assist to support trunk to get to standing from EOB.  Pt reporting she feels like she is tipping backwards in standing (and is not).    Ambulation/Gait Ambulation/Gait assistance: Mod assist Ambulation Distance (Feet): 3 Feet Assistive device: Rolling walker (2 wheeled) Gait Pattern/deviations: Decreased stride length;Shuffle;Trunk flexed;Step-to pattern     General Gait Details: Pt with very flexed posture, niece reporting this is baseline, although worse now, unable to stand more upright due to pain.  Assist needed to stabilize RW and support pt's trunk over weak legs.  Pt having difficulty moving feet today.            Balance Overall balance assessment: Needs assistance Sitting-balance support: Feet supported;Bilateral upper extremity supported Sitting balance-Leahy Scale: Fair Sitting balance - Comments: close supervision EOB.    Standing balance support: Bilateral upper extremity supported Standing balance-Leahy Scale: Poor Standing balance comment: heavy mod assist at trunk with RW use.                            Cognition Arousal/Alertness: Awake/alert Behavior During Therapy: WFL for tasks assessed/performed Overall Cognitive Status: Within Functional Limits for tasks assessed                                        Exercises General Exercises - Lower Extremity Ankle Circles/Pumps: AROM;Both;10 reps;Seated Long Arc Quad: AROM;Both;10 reps;Seated Hip ABduction/ADduction: AROM;Both;10 reps;Seated Hip Flexion/Marching: AROM;Both;10 reps;Seated    General Comments General comments (skin integrity, edema, etc.): Per niece she sat up an hour yesterday and had significant difficulty getting  back in the bed afterwards.  We are going to plan on 45 mins and then PT come back to use the standing frame to get her back to bed.       Pertinent Vitals/Pain Pain Assessment: Faces Faces Pain Scale: Hurts even more Pain Location: neck and back Pain Descriptors / Indicators: Burning;Aching Pain Intervention(s): Limited activity within patient's tolerance;Monitored during  session;Repositioned           PT Goals (current goals can now be found in the care plan section) Acute Rehab PT Goals Patient Stated Goal: to do better each time Progress towards PT goals: Not progressing toward goals - comment (pt did better yesterday)    Frequency    Min 3X/week      PT Plan Current plan remains appropriate       AM-PAC PT "6 Clicks" Daily Activity  Outcome Measure  Difficulty turning over in bed (including adjusting bedclothes, sheets and blankets)?: Total Difficulty moving from lying on back to sitting on the side of the bed? : Total Difficulty sitting down on and standing up from a chair with arms (e.g., wheelchair, bedside commode, etc,.)?: Total Help needed moving to and from a bed to chair (including a wheelchair)?: A Lot Help needed walking in hospital room?: A Lot Help needed climbing 3-5 steps with a railing? : Total 6 Click Score: 8    End of Session Equipment Utilized During Treatment: Gait belt;Cervical collar Activity Tolerance: Patient limited by pain;Patient limited by fatigue Patient left: in chair;with call bell/phone within reach;with family/visitor present   PT Visit Diagnosis: Muscle weakness (generalized) (M62.81);Pain;Unsteadiness on feet (R26.81);Other abnormalities of gait and mobility (R26.89);History of falling (Z91.81) Pain - Right/Left: Left Pain - part of body:  (pelvis, neck)     Time: 8242-3536 PT Time Calculation (min) (ACUTE ONLY): 31 min  Charges:  $Gait Training: 8-22 mins $Therapeutic Activity: 8-22 mins           Sacheen Arrasmith B. Omer, [place], DPT [phone]           05/20/2017, 1:22 PM

## 2017-05-20 NOTE — Plan of Care (Signed)
Problem: Safety: Goal: Ability to remain free from injury will improve Outcome: Adequate for Discharge Patient has been consistent in using call bell when needing assistance.  No attempts to get out of bed made last pm.  Patient verbalized understanding of calling for assistance.  Patient progressing towards goal.

## 2017-05-20 NOTE — Progress Notes (Signed)
05/20/17 1408  PT Visit Information  Last PT Received On 05/20/17  Assistance Needed +1  History of Present Illness 83 yo frail female who sustained a mechanical fall.  Was found to have acute left-sided superior and inferior pubic rami fracture and healed right-sided rami fractures and suspected T1, T2 fractures. Pt with PMH: bilateral knee OA, HTN, GERD, CHF, CKD, A-fib  Subjective Data  Patient Stated Goal to do better each time  Precautions  Precautions Fall  Required Braces or Orthoses Cervical Brace  Cervical Brace Hard collar;At all times  Restrictions  RLE Weight Bearing WBAT  LLE Weight Bearing WBAT  Pain Assessment  Pain Assessment Faces  Faces Pain Scale 6  Pain Location neck, back, left side, left pelvis  Pain Descriptors / Indicators Burning;Aching  Pain Intervention(s) Limited activity within patient's tolerance;Monitored during session;Repositioned  Cognition  Arousal/Alertness Awake/alert  Behavior During Therapy WFL for tasks assessed/performed  Overall Cognitive Status Within Functional Limits for tasks assessed  Bed Mobility  Overal bed mobility Needs Assistance  Bed Mobility Sit to Supine  Sit to supine Max assist  General bed mobility comments Max assist to help support trunk and bil legs.    Transfers  Overall transfer level Needs assistance  Transfer via [place] to/from Stand  Sit to Stand Max assist  General transfer comment It took two attempts and ultimately max assist to stand from lower recliner after being up for 45 mins in the chair.  Pt needed support at trunk and ischium to power up to stand with the standing frame.  Once standing she could hold on and stay standing until I turned the standing frame in line with the bed and she was able to sit.    Balance  Overall balance assessment Needs assistance  Sitting-balance support Feet supported;Bilateral upper extremity supported  Sitting balance-Leahy Scale Fair   Standing balance support Bilateral upper extremity supported  Standing balance-Leahy Scale Poor  PT - Assessment/Plan  PT Plan Current plan remains appropriate  PT Visit Diagnosis Muscle weakness (generalized) (M62.81);Pain;Unsteadiness on feet (R26.81);Other abnormalities of gait and mobility (R26.89);History of falling (Z91.81)  Pain - Right/Left Left  Pain - part of body (neck, pelvis, chest)  PT Frequency (ACUTE ONLY) Min 3X/week  Follow Up Recommendations SNF;Supervision/Assistance - 24 hour  PT equipment None recommended by PT  AM-PAC PT "6 Clicks" Daily Activity Outcome Measure  Difficulty turning over in bed (including adjusting bedclothes, sheets and blankets)? 1  Difficulty moving from lying on back to sitting on the side of the bed?  1  Difficulty sitting down on and standing up from a chair with arms (e.g., wheelchair, bedside commode, etc,.)? 1  Help needed moving to and from a bed to chair (including a wheelchair)? 2  Help needed walking in hospital room? 1  Help needed climbing 3-5 steps with a railing?  1  6 Click Score 7  Mobility G Code  CM  PT Goal Progression  Progress towards PT goals Not progressing toward goals - comment (due to fatigue, weakness, and pain after being up 45 mins)  PT Time Calculation  PT Start Time (ACUTE ONLY) 1345  PT Stop Time (ACUTE ONLY) 1407  PT Time Calculation (min) (ACUTE ONLY) 22 min  PT General Charges  $$ ACUTE PT VISIT 1 Procedure  PT Treatments  $Therapeutic Activity 8-22 mins  05/20/2017 PT returned to make transition back to bed smoother today than reported yesterday.  Pt needed multiple attempts to  stand with standing frame and was weaker and more fatigued after only being up 45 mins, but the standing frame helped with safety returning to bed.  Pt due to d/c to SNF later today. Barbarann Ehlers Far[place], [place], DPT [phone]

## 2017-05-21 DIAGNOSIS — I10 Essential (primary) hypertension: Secondary | ICD-10-CM | POA: Diagnosis not present

## 2017-05-21 DIAGNOSIS — M159 Polyosteoarthritis, unspecified: Secondary | ICD-10-CM

## 2017-05-21 DIAGNOSIS — M8000XD Age-related osteoporosis with current pathological fracture, unspecified site, subsequent encounter for fracture with routine healing: Secondary | ICD-10-CM | POA: Diagnosis not present

## 2017-05-21 DIAGNOSIS — I480 Paroxysmal atrial fibrillation: Secondary | ICD-10-CM | POA: Diagnosis not present

## 2017-05-21 DIAGNOSIS — M150 Primary generalized (osteo)arthritis: Secondary | ICD-10-CM

## 2017-05-21 DIAGNOSIS — M8949 Other hypertrophic osteoarthropathy, multiple sites: Secondary | ICD-10-CM

## 2017-05-21 HISTORY — DX: Polyosteoarthritis, unspecified: M15.9

## 2017-05-21 HISTORY — DX: Essential (primary) hypertension: I10

## 2017-05-21 HISTORY — DX: Age-related osteoporosis with current pathological fracture, unspecified site, subsequent encounter for fracture with routine healing: M80.00XD

## 2017-05-21 HISTORY — DX: Other hypertrophic osteoarthropathy, multiple sites: M89.49

## 2017-05-22 ENCOUNTER — Other Ambulatory Visit
Admission: RE | Admit: 2017-05-22 | Discharge: 2017-05-22 | Disposition: A | Discharge status: Home / Self Care | Payer: PPO | Source: Skilled Nursing Facility | Attending: Gerontology | Admitting: Gerontology

## 2017-05-22 DIAGNOSIS — D649 Anemia, unspecified: Secondary | ICD-10-CM | POA: Diagnosis not present

## 2017-05-22 DIAGNOSIS — E871 Hypo-osmolality and hyponatremia: Secondary | ICD-10-CM | POA: Insufficient documentation

## 2017-05-22 LAB — COMPREHENSIVE METABOLIC PANEL
ALK PHOS: 37 U/L — AB (ref 38–126)
ALT: 8 U/L — AB (ref 14–54)
AST: 14 U/L — AB (ref 15–41)
Albumin: 2.7 g/dL — ABNORMAL LOW (ref 3.5–5.0)
Anion gap: 5 (ref 5–15)
BILIRUBIN TOTAL: 0.9 mg/dL (ref 0.3–1.2)
BUN: 17 mg/dL (ref 6–20)
CALCIUM: 8.3 mg/dL — AB (ref 8.9–10.3)
CO2: 22 mmol/L (ref 22–32)
CREATININE: 0.81 mg/dL (ref 0.44–1.00)
Chloride: 104 mmol/L (ref 101–111)
Glucose, Bld: 87 mg/dL (ref 65–99)
Potassium: 3.9 mmol/L (ref 3.5–5.1)
Sodium: 131 mmol/L — ABNORMAL LOW (ref 135–145)
Total Protein: 5.4 g/dL — ABNORMAL LOW (ref 6.5–8.1)

## 2017-05-22 LAB — CBC WITH DIFFERENTIAL/PLATELET
Basophils Absolute: 0 10*3/uL (ref 0–0.1)
Basophils Relative: 1 %
EOS PCT: 2 %
Eosinophils Absolute: 0.2 10*3/uL (ref 0–0.7)
HEMATOCRIT: 29.2 % — AB (ref 35.0–47.0)
HEMOGLOBIN: 9.9 g/dL — AB (ref 12.0–16.0)
LYMPHS ABS: 1.8 10*3/uL (ref 1.0–3.6)
LYMPHS PCT: 27 %
MCH: 30.8 pg (ref 26.0–34.0)
MCHC: 33.9 g/dL (ref 32.0–36.0)
MCV: 91 fL (ref 80.0–100.0)
Monocytes Absolute: 0.6 10*3/uL (ref 0.2–0.9)
Monocytes Relative: 10 %
NEUTROS ABS: 4 10*3/uL (ref 1.4–6.5)
NEUTROS PCT: 60 %
Platelets: 249 10*3/uL (ref 150–440)
RBC: 3.21 MIL/uL — AB (ref 3.80–5.20)
RDW: 14.3 % (ref 11.5–14.5)
WBC: 6.6 10*3/uL (ref 3.6–11.0)

## 2017-05-24 ENCOUNTER — Encounter
Admission: RE | Admit: 2017-05-24 | Discharge: 2017-05-24 | Disposition: A | Discharge status: Home / Self Care | Payer: PPO | Source: Ambulatory Visit | Attending: Internal Medicine | Admitting: Internal Medicine

## 2017-05-24 DIAGNOSIS — I4891 Unspecified atrial fibrillation: Secondary | ICD-10-CM | POA: Diagnosis not present

## 2017-05-24 DIAGNOSIS — I481 Persistent atrial fibrillation: Secondary | ICD-10-CM | POA: Diagnosis not present

## 2017-05-24 DIAGNOSIS — G8911 Acute pain due to trauma: Secondary | ICD-10-CM | POA: Diagnosis not present

## 2017-05-24 DIAGNOSIS — E46 Unspecified protein-calorie malnutrition: Secondary | ICD-10-CM | POA: Diagnosis not present

## 2017-05-24 DIAGNOSIS — S3282XD Multiple fractures of pelvis without disruption of pelvic ring, subsequent encounter for fracture with routine healing: Secondary | ICD-10-CM | POA: Diagnosis not present

## 2017-05-24 DIAGNOSIS — M4120 Other idiopathic scoliosis, site unspecified: Secondary | ICD-10-CM | POA: Diagnosis not present

## 2017-05-24 DIAGNOSIS — L89100 Pressure ulcer of unspecified part of back, unstageable: Secondary | ICD-10-CM | POA: Diagnosis not present

## 2017-05-24 DIAGNOSIS — I442 Atrioventricular block, complete: Secondary | ICD-10-CM | POA: Diagnosis not present

## 2017-05-24 DIAGNOSIS — K219 Gastro-esophageal reflux disease without esophagitis: Secondary | ICD-10-CM | POA: Diagnosis not present

## 2017-05-24 DIAGNOSIS — M8000XD Age-related osteoporosis with current pathological fracture, unspecified site, subsequent encounter for fracture with routine healing: Secondary | ICD-10-CM | POA: Diagnosis not present

## 2017-05-24 DIAGNOSIS — H04123 Dry eye syndrome of bilateral lacrimal glands: Secondary | ICD-10-CM | POA: Diagnosis not present

## 2017-05-24 DIAGNOSIS — I10 Essential (primary) hypertension: Secondary | ICD-10-CM | POA: Diagnosis not present

## 2017-05-24 DIAGNOSIS — R1311 Dysphagia, oral phase: Secondary | ICD-10-CM | POA: Diagnosis not present

## 2017-05-24 DIAGNOSIS — Z7901 Long term (current) use of anticoagulants: Secondary | ICD-10-CM | POA: Diagnosis not present

## 2017-05-24 DIAGNOSIS — S2232XD Fracture of one rib, left side, subsequent encounter for fracture with routine healing: Secondary | ICD-10-CM | POA: Diagnosis not present

## 2017-05-24 DIAGNOSIS — R262 Difficulty in walking, not elsewhere classified: Secondary | ICD-10-CM | POA: Diagnosis not present

## 2017-05-24 DIAGNOSIS — Z9181 History of falling: Secondary | ICD-10-CM | POA: Diagnosis not present

## 2017-05-24 DIAGNOSIS — G8929 Other chronic pain: Secondary | ICD-10-CM | POA: Diagnosis not present

## 2017-05-24 DIAGNOSIS — S22019D Unspecified fracture of first thoracic vertebra, subsequent encounter for fracture with routine healing: Secondary | ICD-10-CM | POA: Diagnosis not present

## 2017-05-24 DIAGNOSIS — M6281 Muscle weakness (generalized): Secondary | ICD-10-CM | POA: Diagnosis not present

## 2017-05-24 DIAGNOSIS — N3941 Urge incontinence: Secondary | ICD-10-CM | POA: Diagnosis not present

## 2017-05-24 DIAGNOSIS — Z950 Presence of cardiac pacemaker: Secondary | ICD-10-CM | POA: Diagnosis not present

## 2017-05-25 ENCOUNTER — Telehealth: Payer: Self-pay | Admitting: Internal Medicine

## 2017-05-25 NOTE — Telephone Encounter (Signed)
Pt niece calling stating pt is in Rehab and is wanting to know if patient needs to have the [place] device is still at home with her. She was not sure if she needed to have them with patient  Would like advise on this

## 2017-05-25 NOTE — Telephone Encounter (Signed)
Informed Caffie Damme that she should take patient's monitor to the rehab. Caffie Damme verbalized understanding.

## 2017-05-26 ENCOUNTER — Other Ambulatory Visit: Payer: Self-pay | Admitting: Family Medicine

## 2017-05-28 ENCOUNTER — Non-Acute Institutional Stay (SKILLED_NURSING_FACILITY): Payer: PPO | Admitting: Gerontology

## 2017-05-28 DIAGNOSIS — M8000XD Age-related osteoporosis with current pathological fracture, unspecified site, subsequent encounter for fracture with routine healing: Secondary | ICD-10-CM | POA: Diagnosis not present

## 2017-05-28 DIAGNOSIS — G8911 Acute pain due to trauma: Secondary | ICD-10-CM | POA: Diagnosis not present

## 2017-05-29 ENCOUNTER — Encounter: Payer: Self-pay | Admitting: Gerontology

## 2017-05-29 NOTE — Progress Notes (Signed)
Location:   [facility] Room Number: 202B Place of Service:  SNF [phone]) Provider:  Toni Arthurs, NP-C  Caryl Bis Angela Adam, MD  Patient Care Team: Leone Haven, MD as PCP - General (Family Medicine)  Extended Emergency Contact Information Primary Emergency Contact: [place] of [place] Phone: [phone] Mobile Phone: [phone] Relation: Niece Secondary Emergency Contact: Rolly Salter" Address: [address]          [place], [place] [address] Johnnette Litter of [place] Phone: [phone] Mobile Phone: [phone] Relation: Other  Code Status:  DNR Goals of care: Advanced Directive information Advanced Directives 05/28/2017  Does Patient Have a Medical Advance Directive? Yes  Type of Advance Directive Out of facility DNR (pink MOST or yellow form)  Does patient want to make changes to medical advance directive? No - Patient declined  Copy of [place] in Chart? -  Would patient like information on creating a medical advance directive? -     Chief Complaint  Patient presents with  . Medical Management of Chronic Issues    Routine Visit    HPI:  Pt is a 83 y.o. female seen today for follow up. Pt was admitted to the facility for rehab following a Fall with a t1 and t2 fracture and pelvic fx per dc summary. She was treated with a cervical collar and has that on now. She reports her pain is controlled at this point. Using prn Oxycodone and Tylenol. Left hand edema that is resolving. Pt reports her appetite is ok, bowels are more regular now. Pt denies n/v/d/f/c/cp/sob/ha/abd pain/dizziness/cough. VSS. No other complaints.       Past Medical History:  Diagnosis Date  . Age-related osteoporosis with current pathological fracture with routine healing 05/21/2017  . Arthritis   . Atrial fibrillation ([place])   . Carcinoma of the skin, basal cell   . CHF (congestive heart failure) ([place])   . Chickenpox     . CKD (chronic kidney disease)   . GERD (gastroesophageal reflux disease)   . Hiatal hernia 11/25/2016  . HTN, goal below 140/90 05/21/2017  . Hypertension   . Low sodium levels   . Pacemaker   . Primary osteoarthritis involving multiple joints 05/21/2017  . Urge incontinence   . UTI (lower urinary tract infection)    Past Surgical History:  Procedure Laterality Date  . ABDOMINAL HYSTERECTOMY    . APPENDECTOMY    . BASAL CELL CARCINOMA EXCISION Left    cheek  . broken pelvis    . BUNIONECTOMY Bilateral   . CARDIAC CATHETERIZATION    . CORONARY ANGIOGRAM  2015   [facility]   . CORONARY ANGIOGRAM  2015  . HEMORRHOID SURGERY    . PACEMAKER INSERTION  12/25/2008   [employer] pacemaker DRRF 2210 Serial [id number]  . PACEMAKER PLACEMENT  2005  . PPM GENERATOR CHANGEOUT N/A 01/14/2017   Procedure: PPM Generator Changeout;  Surgeon: Deboraha Sprang, MD;  Location: [facility];  Service: Cardiovascular;  Laterality: N/A;  . SKIN SURGERY     removal of a mass on cheek  . SMALL INTESTINE SURGERY  2016  . TONSILLECTOMY      Allergies  Allergen Reactions  . Tetanus Toxoid, Adsorbed Rash  . Tetanus Toxoids Rash    Allergies as of 05/28/2017      Reactions   Tetanus Toxoids Rash      Medication List       Accurate as  of 05/28/17 11:59 PM. Always use your most recent med list.          acetaminophen 325 MG tablet Commonly known as:  TYLENOL Take 2 tablets (650 mg total) by mouth every 6 (six) hours.   Calcium 600-200 MG-UNIT tablet Take 1 tablet by mouth daily.   Cholecalciferol 4000 units Caps Take 1 capsule by mouth daily.   docusate sodium 100 MG capsule Commonly known as:  COLACE Take 1 capsule (100 mg total) by mouth 2 (two) times daily.   ELIQUIS 2.5 MG Tabs tablet Generic drug:  apixaban TAKE ONE TABLET BY MOUTH TWICE DAILY   ENSURE Take 237 mLs by mouth 3 (three) times daily between meals.   feeding supplement (PRO-STAT SUGAR FREE 64) Liqd Take 30 mLs by  mouth 2 (two) times daily between meals. 10 am , 3 pm   fluticasone 50 MCG/ACT nasal spray Commonly known as:  FLONASE Place 1 spray into both nostrils daily.   ICAPS AREDS 2 Caps Take 1 capsule by mouth daily.   metoprolol succinate 50 MG 24 hr tablet Commonly known as:  TOPROL-XL TAKE 1 TABLET (50 MG TOTAL) BY MOUTH DAILY. TAKE WITH OR IMMEDIATELY FOLLOWING A MEAL.   ondansetron 4 MG tablet Commonly known as:  ZOFRAN Take 1 tablet (4 mg total) by mouth every 6 (six) hours as needed for nausea.   oxybutynin 5 MG tablet Commonly known as:  DITROPAN TAKE 1 TABLET (5 MG TOTAL) BY MOUTH 2 (TWO) TIMES DAILY.   oxyCODONE 5 MG immediate release tablet Commonly known as:  Oxy IR/ROXICODONE Take 1 tablet (5 mg total) by mouth every 4 (four) hours as needed for moderate pain.   pantoprazole 40 MG tablet Commonly known as:  PROTONIX Take 40 mg by mouth daily.   polyethylene glycol packet Commonly known as:  MIRALAX / GLYCOLAX Take 17 g by mouth daily.   REFRESH 1.4-0.6 % Soln Generic drug:  Polyvinyl Alcohol-Povidone PF Place 1 drop into both eyes daily.   senna 8.6 MG Tabs tablet Commonly known as:  SENOKOT Take 1 tablet by mouth 2 (two) times daily.   sodium chloride 0.65 % Soln nasal spray Commonly known as:  OCEAN Place 1 spray into both nostrils 2 (two) times daily as needed for congestion.   traZODone 50 MG tablet Commonly known as:  DESYREL Take 50 mg by mouth at bedtime.       Review of Systems  Constitutional: Negative for activity change, appetite change, chills, diaphoresis and fever.  HENT: Negative for congestion, sneezing, sore throat, trouble swallowing and voice change.   Respiratory: Negative for apnea, cough, choking, chest tightness, shortness of breath and wheezing.   Cardiovascular: Negative for chest pain, palpitations and leg swelling.  Gastrointestinal: Negative for abdominal distention, abdominal pain, constipation, diarrhea and nausea.    Genitourinary: Negative for difficulty urinating, dysuria, frequency and urgency.  Musculoskeletal: Positive for arthralgias (typical arthritis), neck pain and neck stiffness. Negative for back pain, gait problem and myalgias.  Skin: Negative for color change, pallor, rash and wound.  Neurological: Negative for dizziness, tremors, syncope, speech difficulty, weakness, numbness and headaches.  Psychiatric/Behavioral: Negative for agitation and behavioral problems.  All other systems reviewed and are negative.   Immunization History  Administered Date(s) Administered  . Influenza, High Dose Seasonal PF 08/27/2016   Pertinent  Health Maintenance Due  Topic Date Due  . DEXA SCAN  07/03/1989  . PNA vac Low Risk Adult (2 of 2 - PPSV23) 09/22/2016  .  INFLUENZA VACCINE  06/24/2017   Fall Risk  02/25/2017 10/20/2016 06/02/2016 05/06/2016 04/08/2016  Falls in the past year? No No No No No  Number falls in past yr: - - - - -  Injury with Fall? - - - - -  Risk Factor Category  - - - - -  Risk for fall due to : - - - - -  Follow up - - - - -   Functional Status Survey:    Vitals:   05/28/17 0848  BP: 113/66  Pulse: 70  Resp: 18  Temp: 97.6 F (36.4 C)  SpO2: 98%  Weight: 121 lb 9.6 oz (55.2 kg)  Height: 5' (1.524 m)   Body mass index is 23.75 kg/m. Physical Exam  Constitutional: She is oriented to person, place, and time. Vital signs are normal. She appears well-developed and well-nourished. She is active and cooperative. She does not appear ill. No distress. Cervical collar in place.  HENT:  Head: Normocephalic and atraumatic.  Mouth/Throat: Uvula is midline, oropharynx is clear and moist and mucous membranes are normal. Mucous membranes are not pale, not dry and not cyanotic.  Eyes: Pupils are equal, round, and reactive to light. Conjunctivae, EOM and lids are normal.  Neck: Trachea normal, normal range of motion and full passive range of motion without pain. Neck supple. No JVD  present. No tracheal deviation, no edema and no erythema present. No thyromegaly present.  Cardiovascular: Normal rate, regular rhythm, normal heart sounds, intact distal pulses and normal pulses.  Exam reveals no gallop, no distant heart sounds and no friction rub.   No murmur heard. Pulmonary/Chest: Effort normal and breath sounds normal. No accessory muscle usage. No respiratory distress. She has no wheezes. She has no rales. She exhibits no tenderness.  Abdominal: Normal appearance and bowel sounds are normal. She exhibits no distension and no ascites. There is no tenderness.  Musculoskeletal: She exhibits no edema or tenderness.       Cervical back: She exhibits decreased range of motion, bony tenderness and pain.  Expected osteoarthritis, stiffness; C-collar in place; Calves soft, supple, negative homan's sign; pelvic fractures  Neurological: She is alert and oriented to person, place, and time. She has normal strength.  Skin: Skin is warm, dry and intact. No rash noted. She is not diaphoretic. No cyanosis or erythema. No pallor. Nails show no clubbing.  Psychiatric: She has a normal mood and affect. Her speech is normal and behavior is normal. Judgment and thought content normal. Cognition and memory are normal.  Nursing note and vitals reviewed.   Labs reviewed:  Recent Labs  05/18/17 0315 05/19/17 0517 05/22/17 0630  NA 131* 132* 131*  K 4.7 4.5 3.9  CL 100* 103 104  CO2 23 23 22   GLUCOSE 135* 105* 87  BUN 13 16 17   CREATININE 0.96 1.01* 0.81  CALCIUM 8.8* 8.4* 8.3*    Recent Labs  04/11/17 0106 05/17/17 1806 05/22/17 0630  AST 21 21 14*  ALT 7* 12* 8*  ALKPHOS 59 52 37*  BILITOT 0.4 1.0 0.9  PROT 7.5 6.2* 5.4*  ALBUMIN 4.0 3.6 2.7*    Recent Labs  05/17/17 1806  05/19/17 1417 05/20/17 0700 05/22/17 0630  WBC 13.8*  < > 10.6* 9.1 6.6  NEUTROABS 11.7*  --  8.6*  --  4.0  HGB 11.4*  < > 10.5* 9.5* 9.9*  HCT 33.9*  < > 32.8* 28.8* 29.2*  MCV 89.7  < > 92.9  92.3 91.0  PLT 213  < > 182 176 249  < > = values in this interval not displayed. No results found for: TSH No results found for: HGBA1C No results found for: CHOL, HDL, LDLCALC, LDLDIRECT, TRIG, CHOLHDL  Significant Diagnostic Results in last 30 days:  Dg Ribs Unilateral W/chest Left  Result Date: 05/17/2017 CLINICAL DATA:  Per EMS pt fell at home unwitnessed. Pt states no LOC and no dizziness. Pt states she stood up with her walker and tried to pull her pants up and tripped and fell on her left side. Pt states pain 10/10- mostly left hip and side/shoulder. EXAM: LEFT RIBS AND CHEST - 3+ VIEW COMPARISON:  Chest x-ray dated 10/20/2016. FINDINGS: Cardiomegaly is stable. Atherosclerotic changes noted at the aortic arch. Probable large hiatal hernia. Chronic blunting at the left costophrenic angle, chronic small pleural effusion versus pleural thickening. Lungs otherwise clear. No new lung findings. No pneumothorax seen. Displaced fracture of an upper left anterior-lateral rib, uncertain number. IMPRESSION: 1. Displaced fracture of an upper left-sided rib, anterior-lateral aspect, uncertain number, only seen on 1 of the rib views. 2. Stable cardiomegaly. 3. Probable large hiatal hernia. 4. No new lung findings.  No pneumothorax seen. Electronically Signed   By: Franki Cabot M.D.   On: 05/17/2017 14:29   Dg Thoracic Spine 2 View  Result Date: 05/17/2017 CLINICAL DATA:  Per EMS pt fell at home unwitnessed. Pt states no LOC and no dizziness. Pt states she stood up with her walker and tried to pull her pants up and tripped and fell on her left side. Pt states pain 10/10- mostly left hip and side/shoulder. EXAM: THORACIC SPINE 2 VIEWS COMPARISON:  None. FINDINGS: Marked kyphosis. Chronic appearing compression deformities of a lower thoracic vertebral body and upper lumbar vertebral body. No acute appearing osseous abnormality. IMPRESSION: 1. Chronic appearing compression deformities of a lower thoracic  vertebral body and an upper lumbar vertebral body. 2. Marked kyphosis. 3. The compression fracture deformities of T1 and T2 are better seen on today's cervical spine CT. Please see the CT report. Electronically Signed   By: Franki Cabot M.D.   On: 05/17/2017 14:34   Dg Ankle 2 Views Right  Result Date: 05/18/2017 CLINICAL DATA:  Fall yesterday EXAM: RIGHT ANKLE - 2 VIEW COMPARISON:  None. FINDINGS: There is no oblique view. This limits the examination. There is no obvious fracture or dislocation. Osteopenia. Spurring at the posterior and inferior calcaneus is minimal. IMPRESSION: Limited exam.  No obvious acute injury.  Chronic changes. Electronically Signed   By: Marybelle Killings M.D.   On: 05/18/2017 10:10   Ct Head Wo Contrast  Result Date: 05/17/2017 CLINICAL DATA:  Unwitnessed fall. EXAM: CT HEAD WITHOUT CONTRAST CT CERVICAL SPINE WITHOUT CONTRAST TECHNIQUE: Multidetector CT imaging of the head and cervical spine was performed following the standard protocol without intravenous contrast. Multiplanar CT image reconstructions of the cervical spine were also generated. COMPARISON:  None. FINDINGS: CT HEAD FINDINGS Brain: Generalized age related parenchymal atrophy with commensurate dilatation of the ventricles and sulci. Mild chronic small vessel ischemic changes in the periventricular white matter. There is no mass, hemorrhage, edema or other evidence of acute parenchymal abnormality. No extra-axial hemorrhage. Vascular: There are chronic calcified atherosclerotic changes of the large vessels at the skull base. No unexpected hyperdense vessel. Skull: Normal. Negative for fracture or focal lesion. Sinuses/Orbits: No acute finding. Other: None. CT CERVICAL SPINE FINDINGS Alignment: Dextroscoliosis of the lower cervical spine. No evidence of acute vertebral body subluxation.  Skull base and vertebrae: Compression fracture deformities of the T1 and T2 vertebral bodies, both compressed approximately 50%, both with  minimal retropulsion of the posterior cortex without significant central canal stenosis, both of uncertain age. No fracture line or displaced fracture fragment identified within the cervical spine. Facet joints appear intact and normally aligned throughout. Soft tissues and spinal canal: No prevertebral fluid or swelling. No visible canal hematoma. Disc levels: Disc desiccations throughout the cervical and upper thoracic spine but no more than mild central canal stenosis at any level. Upper chest: No acute findings.  Aortic atherosclerosis. Other: Carotid atherosclerosis. IMPRESSION: 1. No acute intracranial abnormality. No intracranial mass, hemorrhage or edema. No skull fracture. Atrophy and chronic ischemic changes in white matter. 2. Compression fracture deformities of the T1 and T2 vertebral bodies, both compressed approximately 50%, both with minimal retropulsion of the posterior cortex without significant central canal stenosis, both of uncertain age but more suggestive of acute fractures based on the appearance on sagittal reconstructions. If acute, the involvement of the anterior and posterior portions of the vertebral bodies would be concerning for unstable fractures. No fracture extension is seen into the posterior elements. 3. Degenerative changes throughout the cervical and upper thoracic spine, but no significant central canal stenosis at any level. 4. Aortic atherosclerosis.  Carotid atherosclerosis. These results were called by telephone at the time of interpretation on 05/17/2017 at 2:20 pm to Dr. Lisa Roca , who verbally acknowledged these results. Electronically Signed   By: Franki Cabot M.D.   On: 05/17/2017 14:08   Ct Cervical Spine Wo Contrast  Result Date: 05/17/2017 CLINICAL DATA:  Unwitnessed fall. EXAM: CT HEAD WITHOUT CONTRAST CT CERVICAL SPINE WITHOUT CONTRAST TECHNIQUE: Multidetector CT imaging of the head and cervical spine was performed following the standard protocol without  intravenous contrast. Multiplanar CT image reconstructions of the cervical spine were also generated. COMPARISON:  None. FINDINGS: CT HEAD FINDINGS Brain: Generalized age related parenchymal atrophy with commensurate dilatation of the ventricles and sulci. Mild chronic small vessel ischemic changes in the periventricular white matter. There is no mass, hemorrhage, edema or other evidence of acute parenchymal abnormality. No extra-axial hemorrhage. Vascular: There are chronic calcified atherosclerotic changes of the large vessels at the skull base. No unexpected hyperdense vessel. Skull: Normal. Negative for fracture or focal lesion. Sinuses/Orbits: No acute finding. Other: None. CT CERVICAL SPINE FINDINGS Alignment: Dextroscoliosis of the lower cervical spine. No evidence of acute vertebral body subluxation. Skull base and vertebrae: Compression fracture deformities of the T1 and T2 vertebral bodies, both compressed approximately 50%, both with minimal retropulsion of the posterior cortex without significant central canal stenosis, both of uncertain age. No fracture line or displaced fracture fragment identified within the cervical spine. Facet joints appear intact and normally aligned throughout. Soft tissues and spinal canal: No prevertebral fluid or swelling. No visible canal hematoma. Disc levels: Disc desiccations throughout the cervical and upper thoracic spine but no more than mild central canal stenosis at any level. Upper chest: No acute findings.  Aortic atherosclerosis. Other: Carotid atherosclerosis. IMPRESSION: 1. No acute intracranial abnormality. No intracranial mass, hemorrhage or edema. No skull fracture. Atrophy and chronic ischemic changes in white matter. 2. Compression fracture deformities of the T1 and T2 vertebral bodies, both compressed approximately 50%, both with minimal retropulsion of the posterior cortex without significant central canal stenosis, both of uncertain age but more  suggestive of acute fractures based on the appearance on sagittal reconstructions. If acute, the involvement of the anterior and  posterior portions of the vertebral bodies would be concerning for unstable fractures. No fracture extension is seen into the posterior elements. 3. Degenerative changes throughout the cervical and upper thoracic spine, but no significant central canal stenosis at any level. 4. Aortic atherosclerosis.  Carotid atherosclerosis. These results were called by telephone at the time of interpretation on 05/17/2017 at 2:20 pm to Dr. Lisa Roca , who verbally acknowledged these results. Electronically Signed   By: Franki Cabot M.D.   On: 05/17/2017 14:08   Ct Abdomen Pelvis W Contrast  Result Date: 05/17/2017 CLINICAL DATA:  Ground level fall with pelvic fractures EXAM: CT ABDOMEN AND PELVIS WITH CONTRAST TECHNIQUE: Multidetector CT imaging of the abdomen and pelvis was performed using the standard protocol following bolus administration of intravenous contrast. CONTRAST:  186mL ISOVUE-300 IOPAMIDOL (ISOVUE-300) INJECTION 61% COMPARISON:  04/11/2017, 05/17/2017, 10/08/2016 FINDINGS: Lower chest: Patchy dependent atelectasis within the posterior right lung base. Trace left pleural effusion. Large hiatal hernia. Cardiomegaly with partially visualized pacing leads. Coronary artery calcifications. Hepatobiliary: Moderate to marked intra and extrahepatic biliary dilatation, slightly progressed since the previous exam, the extrahepatic bile duct measures up to 16 mm. Small calcified stones in the gallbladder. Possible small focus of fat infiltration near the falciform ligament. Pancreas: No inflammation.  Diffuse atrophy. Spleen: Normal in size without focal abnormality. Adrenals/Urinary Tract: Adrenal glands are within normal limits. Mildly atrophic kidneys. No hydronephrosis. The bladder is unremarkable Stomach/Bowel: Stomach is nonenlarged. No dilated small bowel. No colon wall thickening.  Moderate to large amount of stool within the colon Vascular/Lymphatic: Extensive atherosclerotic vascular calcification. No aneurysmal dilatation. No significantly enlarged lymph nodes Reproductive: Status post hysterectomy. No adnexal masses. Other: No free air or free fluid. Musculoskeletal: Old right inferior and superior pubic rami fractures. Acute, mildly displaced and comminuted left inferior and superior pubic rami fractures with extension to the pubic symphysis. No significant widening. Small left pelvic sidewall hematoma and small amount of edema/ hematoma around the left pubic symphysis. Old compression deformities of L2 and L5. Extensive degenerative changes IMPRESSION: 1. No CT evidence for acute solid organ injury or free air 2. Acute, mildly displaced and comminuted left inferior and superior pubic rami fractures with extension to the left pubic symphysis. No significant widening of the symphysis. Small left pelvic sidewall hematoma and small amount of edema/hematoma around the left pubic symphysis. 3. Old compression fractures of L2 and L5 4. Moderate intra and extrahepatic biliary dilatation, increased compared to prior and again concerning for distal obstructing process. Gallstones 5. Large hiatal hernia Electronically Signed   By: Donavan Foil M.D.   On: 05/17/2017 20:56   Dg Chest Port 1 View  Result Date: 05/17/2017 CLINICAL DATA:  Recent fall EXAM: PORTABLE CHEST 1 VIEW COMPARISON:  05/17/2017 FINDINGS: Cardiac shadow remains enlarged. Large hiatal hernia is again seen. No pneumothorax is noted. The known left rib fracture is not well appreciated on this exam. The lungs are clear. No other focal abnormality is noted. Pacing device is again seen and stable. IMPRESSION: No acute abnormality noted. Chronic changes as described. Electronically Signed   By: Inez Catalina M.D.   On: 05/17/2017 19:39   Dg Hip Unilat W Or Wo Pelvis 2-3 Views Left  Result Date: 05/17/2017 CLINICAL DATA:   Unwitnessed fall. EXAM: DG HIP (WITH OR WITHOUT PELVIS) 2-3V LEFT COMPARISON:  None. FINDINGS: Single view of the pelvis and two views of the left hip are provided. Displaced fractures of the left superior and inferior pubic rami, with  probable involvement of the symphysis pubis. Probable displaced fracture within the lateral aspect of the right superior pubic ramus. Remainder of the osseous pelvis appears intact and normally aligned, although portions of the sacrum are obscured by overlying bowel gas. Both femoral heads appear appropriately positioned relative to the acetabuli. No fracture seen within the femoral heads or femoral necks. IMPRESSION: 1. Displaced fractures of the left pubic rami, with probable involvement of the left symphysis pubis. 2. Probable displaced fracture within the right superior pubic ramus. 3. No fracture or dislocation at the left hip. Electronically Signed   By: Franki Cabot M.D.   On: 05/17/2017 14:32   Dg Femur Port Min 2 Views Left  Result Date: 05/17/2017 CLINICAL DATA:  Recent fall with left leg pain, initial encounter EXAM: LEFT FEMUR PORTABLE 2 VIEWS COMPARISON:  Pelvis films from earlier in the same day FINDINGS: Previously seen left pubic rami fractures are incompletely evaluated. The left femur is intact without acute fracture. Degenerative changes of the hip joint and knee joint are seen. Diffuse vascular calcifications are noted. IMPRESSION: Intact femur with degenerative changes in both the hip and knee joint. The known left pubic rami fractures are better visualized on recent pelvis film. Electronically Signed   By: Inez Catalina M.D.   On: 05/17/2017 19:35    Assessment/Plan 1. Age-related osteoporosis with current pathological fracture with routine healing  Continue PT/OT  Cervical collar at all times  Continue exercises as taught by PT/OT  Fall precautions  Safety precautions  2. Pain, acute due to trauma  Continue Tylenol 650 mg po QID  scheduled  Continue Oxycodone 5 mg 1 tablet po Q 4 hours prn pain  Family/ staff Communication:   Total Time:  Documentation:  Face to Face:  Family/Phone:   Labs/tests ordered:    Medication list reviewed and assessed for continued appropriateness. Monthly medication orders reviewed and signed.  Vikki Ports, NP-C Geriatrics [facility] [phone], [place] [address] Cell Phone (Mon-Fri 8am-5pm):  [phone] On Call:  [phone] & follow prompts after 5pm & weekends Office Phone:  [phone] Office Fax:  [phone]

## 2017-06-03 ENCOUNTER — Non-Acute Institutional Stay (SKILLED_NURSING_FACILITY): Payer: PPO | Admitting: Gerontology

## 2017-06-03 DIAGNOSIS — G8911 Acute pain due to trauma: Secondary | ICD-10-CM | POA: Diagnosis not present

## 2017-06-03 DIAGNOSIS — M8000XD Age-related osteoporosis with current pathological fracture, unspecified site, subsequent encounter for fracture with routine healing: Secondary | ICD-10-CM | POA: Diagnosis not present

## 2017-06-04 ENCOUNTER — Encounter: Payer: Self-pay | Admitting: Gerontology

## 2017-06-04 NOTE — Progress Notes (Signed)
Location:   [facility] Room Number: 202B Place of Service:  SNF [phone]) Provider:  Toni Arthurs, NP-C  Caryl Bis Angela Adam, MD  Patient Care Team: Leone Haven, MD as PCP - General (Family Medicine)  Extended Emergency Contact Information Primary Emergency Contact: [place] of [place] Phone: [phone] Mobile Phone: [phone] Relation: Niece Secondary Emergency Contact: Rolly Salter" Address: [address]          [place], [place] [address] Johnnette Litter of [place] Phone: [phone] Mobile Phone: [phone] Relation: Other  Code Status:  DNR Goals of care: Advanced Directive information Advanced Directives 06/03/2017  Does Patient Have a Medical Advance Directive? Yes  Type of Advance Directive Out of facility DNR (pink MOST or yellow form)  Does patient want to make changes to medical advance directive? No - Patient declined  Copy of [place] in Chart? -  Would patient like information on creating a medical advance directive? -     Chief Complaint  Patient presents with  . Acute Visit    Follow up on Pain    HPI:  Pt is a 83 y.o. female seen today for an acute visit for worsening pain. Pt was admitted to the facility for rehab following a Fall with a t1 and t2 fracture and pelvic fx per dc summary. She was treated with a cervical collar and has that on now. She reports her pain is controlled at this point. Using prn Oxycodone and Tylenol. Left hand edema that is resolving. Pt reports her appetite is ok, bowels are more regular now. Pt denies n/v/d/f/c/cp/sob/ha/abd pain/dizziness/cough. VSS. No other complaints.    Past Medical History:  Diagnosis Date  . Age-related osteoporosis with current pathological fracture with routine healing 05/21/2017  . Arthritis   . Atrial fibrillation ([place])   . Carcinoma of the skin, basal cell   . CHF (congestive heart failure) ([place])   . Chickenpox     . CKD (chronic kidney disease)   . GERD (gastroesophageal reflux disease)   . Hiatal hernia 11/25/2016  . HTN, goal below 140/90 05/21/2017  . Hypertension   . Low sodium levels   . Pacemaker   . Primary osteoarthritis involving multiple joints 05/21/2017  . Urge incontinence   . UTI (lower urinary tract infection)    Past Surgical History:  Procedure Laterality Date  . ABDOMINAL HYSTERECTOMY    . APPENDECTOMY    . BASAL CELL CARCINOMA EXCISION Left    cheek  . broken pelvis    . BUNIONECTOMY Bilateral   . CARDIAC CATHETERIZATION    . CORONARY ANGIOGRAM  2015   [facility]   . CORONARY ANGIOGRAM  2015  . HEMORRHOID SURGERY    . PACEMAKER INSERTION  12/25/2008   [employer] pacemaker DRRF 2210 Serial [id number]  . PACEMAKER PLACEMENT  2005  . PPM GENERATOR CHANGEOUT N/A 01/14/2017   Procedure: PPM Generator Changeout;  Surgeon: Deboraha Sprang, MD;  Location: [facility];  Service: Cardiovascular;  Laterality: N/A;  . SKIN SURGERY     removal of a mass on cheek  . SMALL INTESTINE SURGERY  2016  . TONSILLECTOMY      Allergies  Allergen Reactions  . Tetanus Toxoid, Adsorbed Rash  . Tetanus Toxoids Rash    Allergies as of 06/03/2017      Reactions   Tetanus Toxoid, Adsorbed Rash   Tetanus Toxoids Rash      Medication List  Accurate as of 06/03/17 11:59 PM. Always use your most recent med list.          acetaminophen 325 MG tablet Commonly known as:  TYLENOL Take 2 tablets (650 mg total) by mouth every 6 (six) hours.   Calcium Carbonate-Vitamin D 600-200 MG-UNIT Caps Take 1 capsule by mouth daily.   Cholecalciferol 4000 units Caps Take 1 capsule by mouth daily.   docusate sodium 100 MG capsule Commonly known as:  COLACE Take 1 capsule (100 mg total) by mouth 2 (two) times daily.   ELIQUIS 2.5 MG Tabs tablet Generic drug:  apixaban TAKE ONE TABLET BY MOUTH TWICE DAILY   ENSURE Take 237 mLs by mouth 3 (three) times daily between meals.   feeding  supplement (PRO-STAT SUGAR FREE 64) Liqd Take 30 mLs by mouth 2 (two) times daily between meals. 10 am , 3 pm   fluticasone 50 MCG/ACT nasal spray Commonly known as:  FLONASE Place 1 spray into both nostrils daily.   ICAPS AREDS 2 Caps Take 1 capsule by mouth daily.   metoprolol succinate 50 MG 24 hr tablet Commonly known as:  TOPROL-XL TAKE 1 TABLET (50 MG TOTAL) BY MOUTH DAILY. TAKE WITH OR IMMEDIATELY FOLLOWING A MEAL.   ondansetron 4 MG tablet Commonly known as:  ZOFRAN Take 1 tablet (4 mg total) by mouth every 6 (six) hours as needed for nausea.   oxybutynin 5 MG tablet Commonly known as:  DITROPAN TAKE 1 TABLET (5 MG TOTAL) BY MOUTH 2 (TWO) TIMES DAILY.   oxyCODONE 5 MG immediate release tablet Commonly known as:  Oxy IR/ROXICODONE Take 5 mg by mouth 3 (three) times daily. Ok to hold for sedation. Ok to give prn doses in addition to scheduled for severe pain.   oxyCODONE 5 MG immediate release tablet Commonly known as:  Oxy IR/ROXICODONE Take 1 tablet (5 mg total) by mouth every 4 (four) hours as needed for moderate pain.   pantoprazole 40 MG tablet Commonly known as:  PROTONIX Take 40 mg by mouth daily.   polyethylene glycol packet Commonly known as:  MIRALAX / GLYCOLAX Take 17 g by mouth daily.   REFRESH 1.4-0.6 % Soln Generic drug:  Polyvinyl Alcohol-Povidone PF Place 1 drop into both eyes daily.   senna 8.6 MG Tabs tablet Commonly known as:  SENOKOT Take 1 tablet by mouth 2 (two) times daily.   sodium chloride 0.65 % Soln nasal spray Commonly known as:  OCEAN Place 1 spray into both nostrils 2 (two) times daily as needed for congestion.   traZODone 50 MG tablet Commonly known as:  DESYREL Take 50 mg by mouth at bedtime.       Review of Systems  Constitutional: Negative for activity change, appetite change, chills, diaphoresis and fever.  HENT: Negative for congestion, sneezing, sore throat, trouble swallowing and voice change.   Respiratory:  Negative for apnea, cough, choking, chest tightness, shortness of breath and wheezing.   Cardiovascular: Negative for chest pain, palpitations and leg swelling.  Gastrointestinal: Negative for abdominal distention, abdominal pain, constipation, diarrhea and nausea.  Genitourinary: Negative for difficulty urinating, dysuria, frequency and urgency.  Musculoskeletal: Positive for arthralgias (typical arthritis), neck pain and neck stiffness. Negative for back pain, gait problem and myalgias.  Skin: Negative for color change, pallor, rash and wound.  Neurological: Negative for dizziness, tremors, syncope, speech difficulty, weakness, numbness and headaches.  Psychiatric/Behavioral: Negative for agitation and behavioral problems.  All other systems reviewed and are negative.   Immunization History  Administered Date(s) Administered  . Influenza, High Dose Seasonal PF 08/27/2016   Pertinent  Health Maintenance Due  Topic Date Due  . DEXA SCAN  07/03/1989  . PNA vac Low Risk Adult (2 of 2 - PPSV23) 09/22/2016  . INFLUENZA VACCINE  06/24/2017   Fall Risk  02/25/2017 10/20/2016 06/02/2016 05/06/2016 04/08/2016  Falls in the past year? No No No No No  Number falls in past yr: - - - - -  Injury with Fall? - - - - -  Risk Factor Category  - - - - -  Risk for fall due to : - - - - -  Follow up - - - - -   Functional Status Survey:    Vitals:   06/03/17 1215  BP: 126/70  Pulse: 70  Resp: 18  Temp: 97.6 F (36.4 C)  SpO2: 98%  Weight: 121 lb 9.6 oz (55.2 kg)  Height: 5' (1.524 m)   Body mass index is 23.75 kg/m. Physical Exam  Constitutional: She is oriented to person, place, and time. Vital signs are normal. She appears well-developed and well-nourished. She is active and cooperative. She does not appear ill. No distress. Cervical collar in place.  HENT:  Head: Normocephalic and atraumatic.  Mouth/Throat: Uvula is midline, oropharynx is clear and moist and mucous membranes are normal.  Mucous membranes are not pale, not dry and not cyanotic.  Eyes: Pupils are equal, round, and reactive to light. Conjunctivae, EOM and lids are normal.  Neck: Trachea normal, normal range of motion and full passive range of motion without pain. Neck supple. No JVD present. No tracheal deviation, no edema and no erythema present. No thyromegaly present.  Cardiovascular: Normal rate, regular rhythm, normal heart sounds, intact distal pulses and normal pulses.  Exam reveals no gallop, no distant heart sounds and no friction rub.   No murmur heard. Pulmonary/Chest: Effort normal and breath sounds normal. No accessory muscle usage. No respiratory distress. She has no wheezes. She has no rales. She exhibits no tenderness.  Abdominal: Normal appearance and bowel sounds are normal. She exhibits no distension and no ascites. There is no tenderness.  Musculoskeletal: She exhibits no edema or tenderness.       Cervical back: She exhibits decreased range of motion, bony tenderness and pain.  Expected osteoarthritis, stiffness; C-collar in place; Calves soft, supple, negative homan's sign; pelvic fractures  Neurological: She is alert and oriented to person, place, and time. She has normal strength.  Skin: Skin is warm, dry and intact. No rash noted. She is not diaphoretic. No cyanosis or erythema. No pallor. Nails show no clubbing.  Psychiatric: She has a normal mood and affect. Her speech is normal and behavior is normal. Judgment and thought content normal. Cognition and memory are normal.  Nursing note and vitals reviewed.   Labs reviewed:  Recent Labs  05/18/17 0315 05/19/17 0517 05/22/17 0630  NA 131* 132* 131*  K 4.7 4.5 3.9  CL 100* 103 104  CO2 23 23 22   GLUCOSE 135* 105* 87  BUN 13 16 17   CREATININE 0.96 1.01* 0.81  CALCIUM 8.8* 8.4* 8.3*    Recent Labs  04/11/17 0106 05/17/17 1806 05/22/17 0630  AST 21 21 14*  ALT 7* 12* 8*  ALKPHOS 59 52 37*  BILITOT 0.4 1.0 0.9  PROT 7.5 6.2*  5.4*  ALBUMIN 4.0 3.6 2.7*    Recent Labs  05/17/17 1806  05/19/17 1417 05/20/17 0700 05/22/17 0630  WBC 13.8*  < >  10.6* 9.1 6.6  NEUTROABS 11.7*  --  8.6*  --  4.0  HGB 11.4*  < > 10.5* 9.5* 9.9*  HCT 33.9*  < > 32.8* 28.8* 29.2*  MCV 89.7  < > 92.9 92.3 91.0  PLT 213  < > 182 176 249  < > = values in this interval not displayed. No results found for: TSH No results found for: HGBA1C No results found for: CHOL, HDL, LDLCALC, LDLDIRECT, TRIG, CHOLHDL  Significant Diagnostic Results in last 30 days:  Dg Ribs Unilateral W/chest Left  Result Date: 05/17/2017 CLINICAL DATA:  Per EMS pt fell at home unwitnessed. Pt states no LOC and no dizziness. Pt states she stood up with her walker and tried to pull her pants up and tripped and fell on her left side. Pt states pain 10/10- mostly left hip and side/shoulder. EXAM: LEFT RIBS AND CHEST - 3+ VIEW COMPARISON:  Chest x-ray dated 10/20/2016. FINDINGS: Cardiomegaly is stable. Atherosclerotic changes noted at the aortic arch. Probable large hiatal hernia. Chronic blunting at the left costophrenic angle, chronic small pleural effusion versus pleural thickening. Lungs otherwise clear. No new lung findings. No pneumothorax seen. Displaced fracture of an upper left anterior-lateral rib, uncertain number. IMPRESSION: 1. Displaced fracture of an upper left-sided rib, anterior-lateral aspect, uncertain number, only seen on 1 of the rib views. 2. Stable cardiomegaly. 3. Probable large hiatal hernia. 4. No new lung findings.  No pneumothorax seen. Electronically Signed   By: Franki Cabot M.D.   On: 05/17/2017 14:29   Dg Thoracic Spine 2 View  Result Date: 05/17/2017 CLINICAL DATA:  Per EMS pt fell at home unwitnessed. Pt states no LOC and no dizziness. Pt states she stood up with her walker and tried to pull her pants up and tripped and fell on her left side. Pt states pain 10/10- mostly left hip and side/shoulder. EXAM: THORACIC SPINE 2 VIEWS COMPARISON:   None. FINDINGS: Marked kyphosis. Chronic appearing compression deformities of a lower thoracic vertebral body and upper lumbar vertebral body. No acute appearing osseous abnormality. IMPRESSION: 1. Chronic appearing compression deformities of a lower thoracic vertebral body and an upper lumbar vertebral body. 2. Marked kyphosis. 3. The compression fracture deformities of T1 and T2 are better seen on today's cervical spine CT. Please see the CT report. Electronically Signed   By: Franki Cabot M.D.   On: 05/17/2017 14:34   Dg Ankle 2 Views Right  Result Date: 05/18/2017 CLINICAL DATA:  Fall yesterday EXAM: RIGHT ANKLE - 2 VIEW COMPARISON:  None. FINDINGS: There is no oblique view. This limits the examination. There is no obvious fracture or dislocation. Osteopenia. Spurring at the posterior and inferior calcaneus is minimal. IMPRESSION: Limited exam.  No obvious acute injury.  Chronic changes. Electronically Signed   By: Marybelle Killings M.D.   On: 05/18/2017 10:10   Ct Head Wo Contrast  Result Date: 05/17/2017 CLINICAL DATA:  Unwitnessed fall. EXAM: CT HEAD WITHOUT CONTRAST CT CERVICAL SPINE WITHOUT CONTRAST TECHNIQUE: Multidetector CT imaging of the head and cervical spine was performed following the standard protocol without intravenous contrast. Multiplanar CT image reconstructions of the cervical spine were also generated. COMPARISON:  None. FINDINGS: CT HEAD FINDINGS Brain: Generalized age related parenchymal atrophy with commensurate dilatation of the ventricles and sulci. Mild chronic small vessel ischemic changes in the periventricular white matter. There is no mass, hemorrhage, edema or other evidence of acute parenchymal abnormality. No extra-axial hemorrhage. Vascular: There are chronic calcified atherosclerotic changes of the  large vessels at the skull base. No unexpected hyperdense vessel. Skull: Normal. Negative for fracture or focal lesion. Sinuses/Orbits: No acute finding. Other: None. CT  CERVICAL SPINE FINDINGS Alignment: Dextroscoliosis of the lower cervical spine. No evidence of acute vertebral body subluxation. Skull base and vertebrae: Compression fracture deformities of the T1 and T2 vertebral bodies, both compressed approximately 50%, both with minimal retropulsion of the posterior cortex without significant central canal stenosis, both of uncertain age. No fracture line or displaced fracture fragment identified within the cervical spine. Facet joints appear intact and normally aligned throughout. Soft tissues and spinal canal: No prevertebral fluid or swelling. No visible canal hematoma. Disc levels: Disc desiccations throughout the cervical and upper thoracic spine but no more than mild central canal stenosis at any level. Upper chest: No acute findings.  Aortic atherosclerosis. Other: Carotid atherosclerosis. IMPRESSION: 1. No acute intracranial abnormality. No intracranial mass, hemorrhage or edema. No skull fracture. Atrophy and chronic ischemic changes in white matter. 2. Compression fracture deformities of the T1 and T2 vertebral bodies, both compressed approximately 50%, both with minimal retropulsion of the posterior cortex without significant central canal stenosis, both of uncertain age but more suggestive of acute fractures based on the appearance on sagittal reconstructions. If acute, the involvement of the anterior and posterior portions of the vertebral bodies would be concerning for unstable fractures. No fracture extension is seen into the posterior elements. 3. Degenerative changes throughout the cervical and upper thoracic spine, but no significant central canal stenosis at any level. 4. Aortic atherosclerosis.  Carotid atherosclerosis. These results were called by telephone at the time of interpretation on 05/17/2017 at 2:20 pm to Dr. Lisa Roca , who verbally acknowledged these results. Electronically Signed   By: Franki Cabot M.D.   On: 05/17/2017 14:08   Ct Cervical  Spine Wo Contrast  Result Date: 05/17/2017 CLINICAL DATA:  Unwitnessed fall. EXAM: CT HEAD WITHOUT CONTRAST CT CERVICAL SPINE WITHOUT CONTRAST TECHNIQUE: Multidetector CT imaging of the head and cervical spine was performed following the standard protocol without intravenous contrast. Multiplanar CT image reconstructions of the cervical spine were also generated. COMPARISON:  None. FINDINGS: CT HEAD FINDINGS Brain: Generalized age related parenchymal atrophy with commensurate dilatation of the ventricles and sulci. Mild chronic small vessel ischemic changes in the periventricular white matter. There is no mass, hemorrhage, edema or other evidence of acute parenchymal abnormality. No extra-axial hemorrhage. Vascular: There are chronic calcified atherosclerotic changes of the large vessels at the skull base. No unexpected hyperdense vessel. Skull: Normal. Negative for fracture or focal lesion. Sinuses/Orbits: No acute finding. Other: None. CT CERVICAL SPINE FINDINGS Alignment: Dextroscoliosis of the lower cervical spine. No evidence of acute vertebral body subluxation. Skull base and vertebrae: Compression fracture deformities of the T1 and T2 vertebral bodies, both compressed approximately 50%, both with minimal retropulsion of the posterior cortex without significant central canal stenosis, both of uncertain age. No fracture line or displaced fracture fragment identified within the cervical spine. Facet joints appear intact and normally aligned throughout. Soft tissues and spinal canal: No prevertebral fluid or swelling. No visible canal hematoma. Disc levels: Disc desiccations throughout the cervical and upper thoracic spine but no more than mild central canal stenosis at any level. Upper chest: No acute findings.  Aortic atherosclerosis. Other: Carotid atherosclerosis. IMPRESSION: 1. No acute intracranial abnormality. No intracranial mass, hemorrhage or edema. No skull fracture. Atrophy and chronic ischemic  changes in white matter. 2. Compression fracture deformities of the T1 and T2 vertebral bodies,  both compressed approximately 50%, both with minimal retropulsion of the posterior cortex without significant central canal stenosis, both of uncertain age but more suggestive of acute fractures based on the appearance on sagittal reconstructions. If acute, the involvement of the anterior and posterior portions of the vertebral bodies would be concerning for unstable fractures. No fracture extension is seen into the posterior elements. 3. Degenerative changes throughout the cervical and upper thoracic spine, but no significant central canal stenosis at any level. 4. Aortic atherosclerosis.  Carotid atherosclerosis. These results were called by telephone at the time of interpretation on 05/17/2017 at 2:20 pm to Dr. Lisa Roca , who verbally acknowledged these results. Electronically Signed   By: Franki Cabot M.D.   On: 05/17/2017 14:08   Ct Abdomen Pelvis W Contrast  Result Date: 05/17/2017 CLINICAL DATA:  Ground level fall with pelvic fractures EXAM: CT ABDOMEN AND PELVIS WITH CONTRAST TECHNIQUE: Multidetector CT imaging of the abdomen and pelvis was performed using the standard protocol following bolus administration of intravenous contrast. CONTRAST:  164mL ISOVUE-300 IOPAMIDOL (ISOVUE-300) INJECTION 61% COMPARISON:  04/11/2017, 05/17/2017, 10/08/2016 FINDINGS: Lower chest: Patchy dependent atelectasis within the posterior right lung base. Trace left pleural effusion. Large hiatal hernia. Cardiomegaly with partially visualized pacing leads. Coronary artery calcifications. Hepatobiliary: Moderate to marked intra and extrahepatic biliary dilatation, slightly progressed since the previous exam, the extrahepatic bile duct measures up to 16 mm. Small calcified stones in the gallbladder. Possible small focus of fat infiltration near the falciform ligament. Pancreas: No inflammation.  Diffuse atrophy. Spleen: Normal in  size without focal abnormality. Adrenals/Urinary Tract: Adrenal glands are within normal limits. Mildly atrophic kidneys. No hydronephrosis. The bladder is unremarkable Stomach/Bowel: Stomach is nonenlarged. No dilated small bowel. No colon wall thickening. Moderate to large amount of stool within the colon Vascular/Lymphatic: Extensive atherosclerotic vascular calcification. No aneurysmal dilatation. No significantly enlarged lymph nodes Reproductive: Status post hysterectomy. No adnexal masses. Other: No free air or free fluid. Musculoskeletal: Old right inferior and superior pubic rami fractures. Acute, mildly displaced and comminuted left inferior and superior pubic rami fractures with extension to the pubic symphysis. No significant widening. Small left pelvic sidewall hematoma and small amount of edema/ hematoma around the left pubic symphysis. Old compression deformities of L2 and L5. Extensive degenerative changes IMPRESSION: 1. No CT evidence for acute solid organ injury or free air 2. Acute, mildly displaced and comminuted left inferior and superior pubic rami fractures with extension to the left pubic symphysis. No significant widening of the symphysis. Small left pelvic sidewall hematoma and small amount of edema/hematoma around the left pubic symphysis. 3. Old compression fractures of L2 and L5 4. Moderate intra and extrahepatic biliary dilatation, increased compared to prior and again concerning for distal obstructing process. Gallstones 5. Large hiatal hernia Electronically Signed   By: Donavan Foil M.D.   On: 05/17/2017 20:56   Dg Chest Port 1 View  Result Date: 05/17/2017 CLINICAL DATA:  Recent fall EXAM: PORTABLE CHEST 1 VIEW COMPARISON:  05/17/2017 FINDINGS: Cardiac shadow remains enlarged. Large hiatal hernia is again seen. No pneumothorax is noted. The known left rib fracture is not well appreciated on this exam. The lungs are clear. No other focal abnormality is noted. Pacing device is  again seen and stable. IMPRESSION: No acute abnormality noted. Chronic changes as described. Electronically Signed   By: [place] Catalina M.D.   On: 05/17/2017 19:39   Dg Hip Unilat W Or Wo Pelvis 2-3 Views Left  Result Date: 05/17/2017 CLINICAL  DATA:  Unwitnessed fall. EXAM: DG HIP (WITH OR WITHOUT PELVIS) 2-3V LEFT COMPARISON:  None. FINDINGS: Single view of the pelvis and two views of the left hip are provided. Displaced fractures of the left superior and inferior pubic rami, with probable involvement of the symphysis pubis. Probable displaced fracture within the lateral aspect of the right superior pubic ramus. Remainder of the osseous pelvis appears intact and normally aligned, although portions of the sacrum are obscured by overlying bowel gas. Both femoral heads appear appropriately positioned relative to the acetabuli. No fracture seen within the femoral heads or femoral necks. IMPRESSION: 1. Displaced fractures of the left pubic rami, with probable involvement of the left symphysis pubis. 2. Probable displaced fracture within the right superior pubic ramus. 3. No fracture or dislocation at the left hip. Electronically Signed   By: Franki Cabot M.D.   On: 05/17/2017 14:32   Dg Femur Port Min 2 Views Left  Result Date: 05/17/2017 CLINICAL DATA:  Recent fall with left leg pain, initial encounter EXAM: LEFT FEMUR PORTABLE 2 VIEWS COMPARISON:  Pelvis films from earlier in the same day FINDINGS: Previously seen left pubic rami fractures are incompletely evaluated. The left femur is intact without acute fracture. Degenerative changes of the hip joint and knee joint are seen. Diffuse vascular calcifications are noted. IMPRESSION: Intact femur with degenerative changes in both the hip and knee joint. The known left pubic rami fractures are better visualized on recent pelvis film. Electronically Signed   By: [place] Catalina M.D.   On: 05/17/2017 19:35    Assessment/Plan 1. Age-related osteoporosis with current  pathological fracture with routine healing  Continue PT/OT  Cervical collar at all times  Continue exercises as taught by PT/OT  Fall precautions  Safety precautions  2. Pain, acute due to trauma  Continue Tylenol 650 mg po QID scheduled  Continue Oxycodone 5 mg 1 tablet po Q 4 hours prn pain  Schedule Oxycodone 5 mg po TID  Ice pack to neck and/or pelvis, etc prn for pain, swelling  Family/ staff Communication:   Total Time:  Documentation:  Face to Face:  Family/Phone:   Labs/tests ordered:    Medication list reviewed and assessed for continued appropriateness.  Vikki Ports, NP-C Geriatrics [facility] [phone], [place] [address] Cell Phone (Mon-Fri 8am-5pm):  [phone] On Call:  [phone] & follow prompts after 5pm & weekends Office Phone:  [phone] Office Fax:  [phone]

## 2017-06-05 ENCOUNTER — Non-Acute Institutional Stay (SKILLED_NURSING_FACILITY): Payer: PPO | Admitting: Gerontology

## 2017-06-05 DIAGNOSIS — M8000XD Age-related osteoporosis with current pathological fracture, unspecified site, subsequent encounter for fracture with routine healing: Secondary | ICD-10-CM | POA: Diagnosis not present

## 2017-06-05 DIAGNOSIS — G8911 Acute pain due to trauma: Secondary | ICD-10-CM | POA: Diagnosis not present

## 2017-06-05 DIAGNOSIS — E46 Unspecified protein-calorie malnutrition: Secondary | ICD-10-CM | POA: Diagnosis not present

## 2017-06-08 ENCOUNTER — Encounter: Payer: Self-pay | Admitting: Gerontology

## 2017-06-08 DIAGNOSIS — E46 Unspecified protein-calorie malnutrition: Secondary | ICD-10-CM | POA: Insufficient documentation

## 2017-06-08 NOTE — Progress Notes (Signed)
Location:   [facility] Room Number: 202B Place of Service:  SNF [phone]) Provider:  Toni Arthurs, NP-C  Caryl Bis Angela Adam, MD  Patient Care Team: Leone Haven, MD as PCP - General (Family Medicine)  Extended Emergency Contact Information Primary Emergency Contact: [place] of [place] Phone: [phone] Mobile Phone: [phone] Relation: Niece Secondary Emergency Contact: Rolly Salter" Address: [address]          [place], [place] [address] Johnnette Litter of [place] Phone: [phone] Mobile Phone: [phone] Relation: Other  Code Status:  DNR Goals of care: Advanced Directive information Advanced Directives 06/04/2017  Does Patient Have a Medical Advance Directive? Yes  Type of Advance Directive Out of facility DNR (pink MOST or yellow form)  Does patient want to make changes to medical advance directive? No - Patient declined  Copy of [place] in Chart? -  Would patient like information on creating a medical advance directive? -     Chief Complaint  Patient presents with  . Acute Visit    Follow up on increased pain    HPI:  Pt is a 83 y.o. female seen today for an acute visit for worsening pain. Pts roommate alerted me the patient has been in severe pain. Pt was admitted to the facility for rehab following a Fall with a t1 and t2 fracture and pelvic fx per dc summary. She was treated with a cervical collar and has that on now. She reports her pain is not well controlled at this point. Using prn Oxycodone and Tylenol. Left hand edema that has resolved. Pt reports her appetite is ok, bowels are more regular now. Pt denies n/v/d/f/c/cp/sob/ha/abd pain/dizziness/cough. VSS. No other complaints.   Past Medical History:  Diagnosis Date  . Age-related osteoporosis with current pathological fracture with routine healing 05/21/2017  . Arthritis   . Atrial fibrillation ([place])   . Carcinoma of  the skin, basal cell   . CHF (congestive heart failure) ([place])   . Chickenpox   . CKD (chronic kidney disease)   . GERD (gastroesophageal reflux disease)   . Hiatal hernia 11/25/2016  . HTN, goal below 140/90 05/21/2017  . Hypertension   . Low sodium levels   . Pacemaker   . Primary osteoarthritis involving multiple joints 05/21/2017  . Urge incontinence   . UTI (lower urinary tract infection)    Past Surgical History:  Procedure Laterality Date  . ABDOMINAL HYSTERECTOMY    . APPENDECTOMY    . BASAL CELL CARCINOMA EXCISION Left    cheek  . broken pelvis    . BUNIONECTOMY Bilateral   . CARDIAC CATHETERIZATION    . CORONARY ANGIOGRAM  2015   [facility]   . CORONARY ANGIOGRAM  2015  . HEMORRHOID SURGERY    . PACEMAKER INSERTION  12/25/2008   [employer] pacemaker DRRF 2210 Serial [id number]  . PACEMAKER PLACEMENT  2005  . PPM GENERATOR CHANGEOUT N/A 01/14/2017   Procedure: PPM Generator Changeout;  Surgeon: Deboraha Sprang, MD;  Location: [facility];  Service: Cardiovascular;  Laterality: N/A;  . SKIN SURGERY     removal of a mass on cheek  . SMALL INTESTINE SURGERY  2016  . TONSILLECTOMY      Allergies  Allergen Reactions  . Tetanus Toxoid, Adsorbed Rash  . Tetanus Toxoids Rash    Allergies as of 06/05/2017      Reactions   Tetanus Toxoid, Adsorbed Rash  Tetanus Toxoids Rash      Medication List       Accurate as of 06/05/17 11:59 PM. Always use your most recent med list.          acetaminophen 325 MG tablet Commonly known as:  TYLENOL Take 2 tablets (650 mg total) by mouth every 6 (six) hours.   ASPERCREME LIDOCAINE 4 % Ptch Generic drug:  Lidocaine Apply 1 patch topically daily. Apply to area pt specifies of pain to the left ribs. Remove patch after 12 hours.   Calcium Carbonate-Vitamin D 600-200 MG-UNIT Caps Take 1 capsule by mouth daily.   Cholecalciferol 4000 units Caps Take 1 capsule by mouth daily.   docusate sodium 100 MG capsule Commonly known  as:  COLACE Take 1 capsule (100 mg total) by mouth 2 (two) times daily.   ELIQUIS 2.5 MG Tabs tablet Generic drug:  apixaban TAKE ONE TABLET BY MOUTH TWICE DAILY   ENSURE Take 237 mLs by mouth 3 (three) times daily between meals.   feeding supplement (PRO-STAT SUGAR FREE 64) Liqd Take 30 mLs by mouth 2 (two) times daily between meals. 10 am , 3 pm   fluticasone 50 MCG/ACT nasal spray Commonly known as:  FLONASE Place 1 spray into both nostrils daily.   ICAPS AREDS 2 Caps Take 1 capsule by mouth daily.   methocarbamol 500 MG tablet Commonly known as:  ROBAXIN Take 500 mg by mouth 4 (four) times daily. 1/2 tablet   metoprolol succinate 50 MG 24 hr tablet Commonly known as:  TOPROL-XL TAKE 1 TABLET (50 MG TOTAL) BY MOUTH DAILY. TAKE WITH OR IMMEDIATELY FOLLOWING A MEAL.   ondansetron 4 MG tablet Commonly known as:  ZOFRAN Take 1 tablet (4 mg total) by mouth every 6 (six) hours as needed for nausea.   oxybutynin 5 MG tablet Commonly known as:  DITROPAN TAKE 1 TABLET (5 MG TOTAL) BY MOUTH 2 (TWO) TIMES DAILY.   oxyCODONE 5 MG immediate release tablet Commonly known as:  Oxy IR/ROXICODONE Take 5 mg by mouth 3 (three) times daily. Ok to hold for sedation. Ok to give prn doses in addition to scheduled for severe pain.   oxyCODONE 5 MG immediate release tablet Commonly known as:  Oxy IR/ROXICODONE Take 1 tablet (5 mg total) by mouth every 4 (four) hours as needed for moderate pain.   pantoprazole 40 MG tablet Commonly known as:  PROTONIX Take 40 mg by mouth daily.   polyethylene glycol packet Commonly known as:  MIRALAX / GLYCOLAX Take 17 g by mouth daily.   REFRESH 1.4-0.6 % Soln Generic drug:  Polyvinyl Alcohol-Povidone PF Place 1 drop into both eyes daily.   senna 8.6 MG Tabs tablet Commonly known as:  SENOKOT Take 1 tablet by mouth 2 (two) times daily.   sodium chloride 0.65 % Soln nasal spray Commonly known as:  OCEAN Place 1 spray into both nostrils 2 (two)  times daily as needed for congestion.   traZODone 50 MG tablet Commonly known as:  DESYREL Take 50 mg by mouth at bedtime.       Review of Systems  Constitutional: Negative for activity change, appetite change, chills, diaphoresis and fever.  HENT: Negative for congestion, sneezing, sore throat, trouble swallowing and voice change.   Respiratory: Negative for apnea, cough, choking, chest tightness, shortness of breath and wheezing.   Cardiovascular: Negative for chest pain, palpitations and leg swelling.  Gastrointestinal: Negative for abdominal distention, abdominal pain, constipation, diarrhea and nausea.  Genitourinary: Negative  for difficulty urinating, dysuria, frequency and urgency.  Musculoskeletal: Positive for arthralgias (typical arthritis), neck pain and neck stiffness. Negative for back pain, gait problem and myalgias.  Skin: Negative for color change, pallor, rash and wound.  Neurological: Negative for dizziness, tremors, syncope, speech difficulty, weakness, numbness and headaches.  Psychiatric/Behavioral: Negative for agitation and behavioral problems.  All other systems reviewed and are negative.   Immunization History  Administered Date(s) Administered  . Influenza, High Dose Seasonal PF 08/27/2016   Pertinent  Health Maintenance Due  Topic Date Due  . DEXA SCAN  07/03/1989  . PNA vac Low Risk Adult (2 of 2 - PPSV23) 09/22/2016  . INFLUENZA VACCINE  06/24/2017   Fall Risk  02/25/2017 10/20/2016 06/02/2016 05/06/2016 04/08/2016  Falls in the past year? _0   Number falls in past yr: - - - - -  Injury with Fall? - - - - -  Risk Factor Category  - - - - -  Risk for fall due to : - - - - -  Follow up - - - - -   Functional Status Survey:    Vitals:   06/05/17 1045  BP: 125/65  Pulse: 70  Resp: 18  Temp: 97.7 F (36.5 C)  SpO2: 100%  Weight: 118 lb 4.8 oz (53.7 kg)  Height: 5' (1.524 m)   Body mass index is 23.1 kg/m. Physical Exam    Constitutional: She is oriented to person, place, and time. Vital signs are normal. She appears well-developed and well-nourished. She is active and cooperative. She does not appear ill. No distress. Cervical collar in place.  HENT:  Head: Normocephalic and atraumatic.  Mouth/Throat: Uvula is midline, oropharynx is clear and moist and mucous membranes are normal. Mucous membranes are not pale, not dry and not cyanotic.  Eyes: Pupils are equal, round, and reactive to light. Conjunctivae, EOM and lids are normal.  Neck: Trachea normal, normal range of motion and full passive range of motion without pain. Neck supple. No JVD present. No tracheal deviation, no edema and no erythema present. No thyromegaly present.  Cardiovascular: Normal rate, regular rhythm, normal heart sounds, intact distal pulses and normal pulses.  Exam reveals no gallop, no distant heart sounds and no friction rub.   No murmur heard. Pulmonary/Chest: Effort normal and breath sounds normal. No accessory muscle usage. No respiratory distress. She has no wheezes. She has no rales. She exhibits no tenderness.  Abdominal: Normal appearance and bowel sounds are normal. She exhibits no distension and no ascites. There is no tenderness.  Musculoskeletal: She exhibits no edema or tenderness.       Cervical back: She exhibits decreased range of motion, bony tenderness and pain.  Expected osteoarthritis, stiffness; C-collar in place; Calves soft, supple, negative homan's sign; pelvic fractures  Neurological: She is alert and oriented to person, place, and time. She has normal strength.  Skin: Skin is warm, dry and intact. No rash noted. She is not diaphoretic. No cyanosis or erythema. No pallor. Nails show no clubbing.  Psychiatric: She has a normal mood and affect. Her speech is normal and behavior is normal. Judgment and thought content normal. Cognition and memory are normal.  Nursing note and vitals reviewed.   Labs  reviewed:  Recent Labs  05/18/17 0315 05/19/17 0517 05/22/17 0630  NA 131* 132* 131*  K 4.7 4.5 3.9  CL 100* 103 104  CO2 _1 GLUCOSE 135* 105* 87  BUN 13 16 17  CREATININE 0.96 1.01* 0.81  CALCIUM 8.8* 8.4* 8.3*    Recent Labs  04/11/17 0106 05/17/17 1806 05/22/17 0630  AST 21 21 14*  ALT 7* 12* 8*  ALKPHOS 59 52 37*  BILITOT 0.4 1.0 0.9  PROT 7.5 6.2* 5.4*  ALBUMIN 4.0 3.6 2.7*    Recent Labs  05/17/17 1806  05/19/17 1417 05/20/17 0700 05/22/17 0630  WBC 13.8*  < > 10.6* 9.1 6.6  NEUTROABS 11.7*  --  8.6*  --  4.0  HGB 11.4*  < > 10.5* 9.5* 9.9*  HCT 33.9*  < > 32.8* 28.8* 29.2*  MCV 89.7  < > 92.9 92.3 91.0  PLT 213  < > 182 176 249  < > = values in this interval not displayed. No results found for: TSH No results found for: HGBA1C No results found for: CHOL, HDL, LDLCALC, LDLDIRECT, TRIG, CHOLHDL  Significant Diagnostic Results in last 30 days:  Dg Ribs Unilateral W/chest Left  Result Date: 05/17/2017 CLINICAL DATA:  Per EMS pt fell at home unwitnessed. Pt states no LOC and no dizziness. Pt states she stood up with her walker and tried to pull her pants up and tripped and fell on her left side. Pt states pain 10/10- mostly left hip and side/shoulder. EXAM: LEFT RIBS AND CHEST - 3+ VIEW COMPARISON:  Chest x-ray dated 10/20/2016. FINDINGS: Cardiomegaly is stable. Atherosclerotic changes noted at the aortic arch. Probable large hiatal hernia. Chronic blunting at the left costophrenic angle, chronic small pleural effusion versus pleural thickening. Lungs otherwise clear. No new lung findings. No pneumothorax seen. Displaced fracture of an upper left anterior-lateral rib, uncertain number. IMPRESSION: 1. Displaced fracture of an upper left-sided rib, anterior-lateral aspect, uncertain number, only seen on 1 of the rib views. 2. Stable cardiomegaly. 3. Probable large hiatal hernia. 4. No new lung findings.  No pneumothorax seen. Electronically Signed   By: Franki Cabot M.D.   On: 05/17/2017 14:29   Dg Thoracic Spine 2 View  Result Date: 05/17/2017 CLINICAL DATA:  Per EMS pt fell at home unwitnessed. Pt states no LOC and no dizziness. Pt states she stood up with her walker and tried to pull her pants up and tripped and fell on her left side. Pt states pain 10/10- mostly left hip and side/shoulder. EXAM: THORACIC SPINE 2 VIEWS COMPARISON:  None. FINDINGS: Marked kyphosis. Chronic appearing compression deformities of a lower thoracic vertebral body and upper lumbar vertebral body. No acute appearing osseous abnormality. IMPRESSION: 1. Chronic appearing compression deformities of a lower thoracic vertebral body and an upper lumbar vertebral body. 2. Marked kyphosis. 3. The compression fracture deformities of T1 and T2 are better seen on today's cervical spine CT. Please see the CT report. Electronically Signed   By: Franki Cabot M.D.   On: 05/17/2017 14:34   Dg Ankle 2 Views Right  Result Date: 05/18/2017 CLINICAL DATA:  Fall yesterday EXAM: RIGHT ANKLE - 2 VIEW COMPARISON:  None. FINDINGS: There is no oblique view. This limits the examination. There is no obvious fracture or dislocation. Osteopenia. Spurring at the posterior and inferior calcaneus is minimal. IMPRESSION: Limited exam.  No obvious acute injury.  Chronic changes. Electronically Signed   By: Marybelle Killings M.D.   On: 05/18/2017 10:10   Ct Head Wo Contrast  Result Date: 05/17/2017 CLINICAL DATA:  Unwitnessed fall. EXAM: CT HEAD WITHOUT CONTRAST CT CERVICAL SPINE WITHOUT CONTRAST TECHNIQUE: Multidetector CT imaging of the head and cervical spine was performed following the standard  protocol without intravenous contrast. Multiplanar CT image reconstructions of the cervical spine were also generated. COMPARISON:  None. FINDINGS: CT HEAD FINDINGS Brain: Generalized age related parenchymal atrophy with commensurate dilatation of the ventricles and sulci. Mild chronic small vessel ischemic changes in the  periventricular white matter. There is no mass, hemorrhage, edema or other evidence of acute parenchymal abnormality. No extra-axial hemorrhage. Vascular: There are chronic calcified atherosclerotic changes of the large vessels at the skull base. No unexpected hyperdense vessel. Skull: Normal. Negative for fracture or focal lesion. Sinuses/Orbits: No acute finding. Other: None. CT CERVICAL SPINE FINDINGS Alignment: Dextroscoliosis of the lower cervical spine. No evidence of acute vertebral body subluxation. Skull base and vertebrae: Compression fracture deformities of the T1 and T2 vertebral bodies, both compressed approximately 50%, both with minimal retropulsion of the posterior cortex without significant central canal stenosis, both of uncertain age. No fracture line or displaced fracture fragment identified within the cervical spine. Facet joints appear intact and normally aligned throughout. Soft tissues and spinal canal: No prevertebral fluid or swelling. No visible canal hematoma. Disc levels: Disc desiccations throughout the cervical and upper thoracic spine but no more than mild central canal stenosis at any level. Upper chest: No acute findings.  Aortic atherosclerosis. Other: Carotid atherosclerosis. IMPRESSION: 1. No acute intracranial abnormality. No intracranial mass, hemorrhage or edema. No skull fracture. Atrophy and chronic ischemic changes in white matter. 2. Compression fracture deformities of the T1 and T2 vertebral bodies, both compressed approximately 50%, both with minimal retropulsion of the posterior cortex without significant central canal stenosis, both of uncertain age but more suggestive of acute fractures based on the appearance on sagittal reconstructions. If acute, the involvement of the anterior and posterior portions of the vertebral bodies would be concerning for unstable fractures. No fracture extension is seen into the posterior elements. 3. Degenerative changes throughout the  cervical and upper thoracic spine, but no significant central canal stenosis at any level. 4. Aortic atherosclerosis.  Carotid atherosclerosis. These results were called by telephone at the time of interpretation on 05/17/2017 at 2:20 pm to Dr. Lisa Roca , who verbally acknowledged these results. Electronically Signed   By: Franki Cabot M.D.   On: 05/17/2017 14:08   Ct Cervical Spine Wo Contrast  Result Date: 05/17/2017 CLINICAL DATA:  Unwitnessed fall. EXAM: CT HEAD WITHOUT CONTRAST CT CERVICAL SPINE WITHOUT CONTRAST TECHNIQUE: Multidetector CT imaging of the head and cervical spine was performed following the standard protocol without intravenous contrast. Multiplanar CT image reconstructions of the cervical spine were also generated. COMPARISON:  None. FINDINGS: CT HEAD FINDINGS Brain: Generalized age related parenchymal atrophy with commensurate dilatation of the ventricles and sulci. Mild chronic small vessel ischemic changes in the periventricular white matter. There is no mass, hemorrhage, edema or other evidence of acute parenchymal abnormality. No extra-axial hemorrhage. Vascular: There are chronic calcified atherosclerotic changes of the large vessels at the skull base. No unexpected hyperdense vessel. Skull: Normal. Negative for fracture or focal lesion. Sinuses/Orbits: No acute finding. Other: None. CT CERVICAL SPINE FINDINGS Alignment: Dextroscoliosis of the lower cervical spine. No evidence of acute vertebral body subluxation. Skull base and vertebrae: Compression fracture deformities of the T1 and T2 vertebral bodies, both compressed approximately 50%, both with minimal retropulsion of the posterior cortex without significant central canal stenosis, both of uncertain age. No fracture line or displaced fracture fragment identified within the cervical spine. Facet joints appear intact and normally aligned throughout. Soft tissues and spinal canal: No prevertebral fluid or swelling.  No visible  canal hematoma. Disc levels: Disc desiccations throughout the cervical and upper thoracic spine but no more than mild central canal stenosis at any level. Upper chest: No acute findings.  Aortic atherosclerosis. Other: Carotid atherosclerosis. IMPRESSION: 1. No acute intracranial abnormality. No intracranial mass, hemorrhage or edema. No skull fracture. Atrophy and chronic ischemic changes in white matter. 2. Compression fracture deformities of the T1 and T2 vertebral bodies, both compressed approximately 50%, both with minimal retropulsion of the posterior cortex without significant central canal stenosis, both of uncertain age but more suggestive of acute fractures based on the appearance on sagittal reconstructions. If acute, the involvement of the anterior and posterior portions of the vertebral bodies would be concerning for unstable fractures. No fracture extension is seen into the posterior elements. 3. Degenerative changes throughout the cervical and upper thoracic spine, but no significant central canal stenosis at any level. 4. Aortic atherosclerosis.  Carotid atherosclerosis. These results were called by telephone at the time of interpretation on 05/17/2017 at 2:20 pm to Dr. Lisa Roca , who verbally acknowledged these results. Electronically Signed   By: Franki Cabot M.D.   On: 05/17/2017 14:08   Ct Abdomen Pelvis W Contrast  Result Date: 05/17/2017 CLINICAL DATA:  Ground level fall with pelvic fractures EXAM: CT ABDOMEN AND PELVIS WITH CONTRAST TECHNIQUE: Multidetector CT imaging of the abdomen and pelvis was performed using the standard protocol following bolus administration of intravenous contrast. CONTRAST:  172m ISOVUE-300 IOPAMIDOL (ISOVUE-300) INJECTION 61% COMPARISON:  04/11/2017, 05/17/2017, 10/08/2016 FINDINGS: Lower chest: Patchy dependent atelectasis within the posterior right lung base. Trace left pleural effusion. Large hiatal hernia. Cardiomegaly with partially visualized pacing  leads. Coronary artery calcifications. Hepatobiliary: Moderate to marked intra and extrahepatic biliary dilatation, slightly progressed since the previous exam, the extrahepatic bile duct measures up to 16 mm. Small calcified stones in the gallbladder. Possible small focus of fat infiltration near the falciform ligament. Pancreas: No inflammation.  Diffuse atrophy. Spleen: Normal in size without focal abnormality. Adrenals/Urinary Tract: Adrenal glands are within normal limits. Mildly atrophic kidneys. No hydronephrosis. The bladder is unremarkable Stomach/Bowel: Stomach is nonenlarged. No dilated small bowel. No colon wall thickening. Moderate to large amount of stool within the colon Vascular/Lymphatic: Extensive atherosclerotic vascular calcification. No aneurysmal dilatation. No significantly enlarged lymph nodes Reproductive: Status post hysterectomy. No adnexal masses. Other: No free air or free fluid. Musculoskeletal: Old right inferior and superior pubic rami fractures. Acute, mildly displaced and comminuted left inferior and superior pubic rami fractures with extension to the pubic symphysis. No significant widening. Small left pelvic sidewall hematoma and small amount of edema/ hematoma around the left pubic symphysis. Old compression deformities of L2 and L5. Extensive degenerative changes IMPRESSION: 1. No CT evidence for acute solid organ injury or free air 2. Acute, mildly displaced and comminuted left inferior and superior pubic rami fractures with extension to the left pubic symphysis. No significant widening of the symphysis. Small left pelvic sidewall hematoma and small amount of edema/hematoma around the left pubic symphysis. 3. Old compression fractures of L2 and L5 4. Moderate intra and extrahepatic biliary dilatation, increased compared to prior and again concerning for distal obstructing process. Gallstones 5. Large hiatal hernia Electronically Signed   By: KDonavan FoilM.D.   On: 05/17/2017  20:56   Dg Chest Port 1 View  Result Date: 05/17/2017 CLINICAL DATA:  Recent fall EXAM: PORTABLE CHEST 1 VIEW COMPARISON:  05/17/2017 FINDINGS: Cardiac shadow remains enlarged. Large hiatal hernia is again seen. No  pneumothorax is noted. The known left rib fracture is not well appreciated on this exam. The lungs are clear. No other focal abnormality is noted. Pacing device is again seen and stable. IMPRESSION: No acute abnormality noted. Chronic changes as described. Electronically Signed   By: Inez Catalina M.D.   On: 05/17/2017 19:39   Dg Hip Unilat W Or Wo Pelvis 2-3 Views Left  Result Date: 05/17/2017 CLINICAL DATA:  Unwitnessed fall. EXAM: DG HIP (WITH OR WITHOUT PELVIS) 2-3V LEFT COMPARISON:  None. FINDINGS: Single view of the pelvis and two views of the left hip are provided. Displaced fractures of the left superior and inferior pubic rami, with probable involvement of the symphysis pubis. Probable displaced fracture within the lateral aspect of the right superior pubic ramus. Remainder of the osseous pelvis appears intact and normally aligned, although portions of the sacrum are obscured by overlying bowel gas. Both femoral heads appear appropriately positioned relative to the acetabuli. No fracture seen within the femoral heads or femoral necks. IMPRESSION: 1. Displaced fractures of the left pubic rami, with probable involvement of the left symphysis pubis. 2. Probable displaced fracture within the right superior pubic ramus. 3. No fracture or dislocation at the left hip. Electronically Signed   By: Franki Cabot M.D.   On: 05/17/2017 14:32   Dg Femur Port Min 2 Views Left  Result Date: 05/17/2017 CLINICAL DATA:  Recent fall with left leg pain, initial encounter EXAM: LEFT FEMUR PORTABLE 2 VIEWS COMPARISON:  Pelvis films from earlier in the same day FINDINGS: Previously seen left pubic rami fractures are incompletely evaluated. The left femur is intact without acute fracture. Degenerative changes  of the hip joint and knee joint are seen. Diffuse vascular calcifications are noted. IMPRESSION: Intact femur with degenerative changes in both the hip and knee joint. The known left pubic rami fractures are better visualized on recent pelvis film. Electronically Signed   By: Inez Catalina M.D.   On: 05/17/2017 19:35    Assessment/Plan 1. Age-related osteoporosis with current pathological fracture with routine healing  Continue PT/OT  Cervical collar at all times  Continue exercises as taught by PT/OT  Fall precautions  Safety precautions  2. Pain, acute due to trauma  Continue Tylenol 650 mg po QID scheduled  Continue Oxycodone 5 mg 1 tablet po Q 4 hours prn pain  Schedule Oxycodone 5 mg po TID  Ice pack to neck and/or pelvis, etc prn for pain, swelling  Methocarbamol 500 mg- 1/2 tablet po QID  Aspercreme (Lidocaine) patch- to left ribs Q Day, remove patch after 12 hours  3. Protein calorie malnutrition  Continue Pro-stat 30 mL po BID  Continue Ensure 1 bottle/ can po BID  Family/ staff Communication:   Total Time:  Documentation:  Face to Face:  Family/Phone:   Labs/tests ordered:  Cbc, met c   Medication list reviewed and assessed for continued appropriateness.  Vikki Ports, NP-C Geriatrics [facility] [phone], [place] [address] Cell Phone (Mon-Fri 8am-5pm):  [phone] On Call:  [phone] & follow prompts after 5pm & weekends Office Phone:  [phone] Office Fax:  [phone]

## 2017-06-09 ENCOUNTER — Other Ambulatory Visit
Admission: RE | Admit: 2017-06-09 | Discharge: 2017-06-09 | Disposition: A | Discharge status: Home / Self Care | Payer: PPO | Source: Ambulatory Visit | Attending: Gerontology | Admitting: Gerontology

## 2017-06-09 DIAGNOSIS — I442 Atrioventricular block, complete: Secondary | ICD-10-CM | POA: Insufficient documentation

## 2017-06-09 DIAGNOSIS — I481 Persistent atrial fibrillation: Secondary | ICD-10-CM | POA: Insufficient documentation

## 2017-06-09 LAB — CBC WITH DIFFERENTIAL/PLATELET
Basophils Absolute: 0 10*3/uL (ref 0–0.1)
Basophils Relative: 1 %
Eosinophils Absolute: 0.1 10*3/uL (ref 0–0.7)
Eosinophils Relative: 2 %
HEMATOCRIT: 34.2 % — AB (ref 35.0–47.0)
HEMOGLOBIN: 11.3 g/dL — AB (ref 12.0–16.0)
LYMPHS PCT: 22 %
Lymphs Abs: 1.4 10*3/uL (ref 1.0–3.6)
MCH: 31.4 pg (ref 26.0–34.0)
MCHC: 33 g/dL (ref 32.0–36.0)
MCV: 95 fL (ref 80.0–100.0)
MONOS PCT: 10 %
Monocytes Absolute: 0.6 10*3/uL (ref 0.2–0.9)
NEUTROS PCT: 65 %
Neutro Abs: 4.1 10*3/uL (ref 1.4–6.5)
Platelets: 342 10*3/uL (ref 150–440)
RBC: 3.6 MIL/uL — AB (ref 3.80–5.20)
RDW: 15.7 % — ABNORMAL HIGH (ref 11.5–14.5)
WBC: 6.2 10*3/uL (ref 3.6–11.0)

## 2017-06-09 LAB — COMPREHENSIVE METABOLIC PANEL
ALK PHOS: 301 U/L — AB (ref 38–126)
ALT: 7 U/L — AB (ref 14–54)
ANION GAP: 7 (ref 5–15)
AST: 18 U/L (ref 15–41)
Albumin: 2.9 g/dL — ABNORMAL LOW (ref 3.5–5.0)
BILIRUBIN TOTAL: 0.6 mg/dL (ref 0.3–1.2)
BUN: 25 mg/dL — ABNORMAL HIGH (ref 6–20)
CALCIUM: 8.6 mg/dL — AB (ref 8.9–10.3)
CO2: 26 mmol/L (ref 22–32)
CREATININE: 0.84 mg/dL (ref 0.44–1.00)
Chloride: 104 mmol/L (ref 101–111)
GFR calc Af Amer: >60 mL/min (ref >60)
GFR, EST NON AFRICAN AMERICAN: 59 mL/min — AB (ref >60)
Glucose, Bld: 130 mg/dL — ABNORMAL HIGH (ref 65–99)
Potassium: 4.1 mmol/L (ref 3.5–5.1)
Sodium: 137 mmol/L (ref 135–145)
TOTAL PROTEIN: 5.8 g/dL — AB (ref 6.5–8.1)

## 2017-06-15 ENCOUNTER — Non-Acute Institutional Stay (SKILLED_NURSING_FACILITY): Payer: PPO | Admitting: Gerontology

## 2017-06-15 ENCOUNTER — Encounter: Payer: Self-pay | Admitting: Gerontology

## 2017-06-15 DIAGNOSIS — G8911 Acute pain due to trauma: Secondary | ICD-10-CM

## 2017-06-15 DIAGNOSIS — L89100 Pressure ulcer of unspecified part of back, unstageable: Secondary | ICD-10-CM

## 2017-06-15 DIAGNOSIS — M8000XD Age-related osteoporosis with current pathological fracture, unspecified site, subsequent encounter for fracture with routine healing: Secondary | ICD-10-CM | POA: Diagnosis not present

## 2017-06-15 NOTE — Progress Notes (Signed)
Location:   [facility] Room Number: 202B Place of Service:  SNF [phone]) Provider:  Toni Arthurs, NP-C  Caryl Bis Angela Adam, MD  Patient Care Team: Leone Haven, MD as PCP - General (Family Medicine)  Extended Emergency Contact Information Primary Emergency Contact: [place] Phone: [phone] Mobile Phone: [phone] Relation: Niece Secondary Emergency Contact: Rolly Salter" Address: [address]          [place], [place] [address] Johnnette Litter of [place] Phone: [phone] Mobile Phone: [phone] Relation: Other  Code Status:  DNR Goals of care: Advanced Directive information Advanced Directives 06/15/2017  Does Patient Have a Medical Advance Directive? Yes  Type of Paramedic of [place];Out of facility DNR (pink MOST or yellow form)  Does patient want to make changes to medical advance directive? No - Patient declined  Copy of [place] in Chart? -  Would patient like information on creating a medical advance directive? -     Chief Complaint  Patient presents with  . Medical Management of Chronic Issues    Wound and pain check    HPI:  Pt is a 83 y.o. female seen today for follow up. Pt was admitted to the facility for rehab following a Fall with a t1 and t2 fracture and pelvic fx per dc summary. She was treated with a cervical collar and has that on now. She reports her pain is not controlled at this point. Using scheduled and prn Oxycodone and Tylenol. Pt also has large decubitus ulcer on her back. Pt does have kyphosis with prominent spinal processes. It is difficult of for the pt to off-set pressure from the wound due to the pain from her pelvic and cervical fractures. Wound is 4 x 2.5 cm with yellow slough in the center and foul odor. Peri-wound tissue with mild induration. Area is tender. No bleeding, no drainage. Nursing has kept it covered with  Allevyn dressing. Pt reports her appetite is ok, bowels are more regular now. Pt denies n/v/d/f/c/cp/sob/ha/abd pain/dizziness/cough. VSS. No other complaints.       Past Medical History:  Diagnosis Date  . Age-related osteoporosis with current pathological fracture with routine healing 05/21/2017  . Arthritis   . Atrial fibrillation ([place])   . Carcinoma of the skin, basal cell   . CHF (congestive heart failure) ([place])   . Chickenpox   . CKD (chronic kidney disease)   . GERD (gastroesophageal reflux disease)   . Hiatal hernia 11/25/2016  . HTN, goal below 140/90 05/21/2017  . Hypertension   . Low sodium levels   . Pacemaker   . Primary osteoarthritis involving multiple joints 05/21/2017  . Urge incontinence   . UTI (lower urinary tract infection)    Past Surgical History:  Procedure Laterality Date  . ABDOMINAL HYSTERECTOMY    . APPENDECTOMY    . BASAL CELL CARCINOMA EXCISION Left    cheek  . broken pelvis    . BUNIONECTOMY Bilateral   . CARDIAC CATHETERIZATION    . CORONARY ANGIOGRAM  2015   [facility]   . CORONARY ANGIOGRAM  2015  . HEMORRHOID SURGERY    . PACEMAKER INSERTION  12/25/2008   [employer] pacemaker DRRF 2210 Serial [id number]  . PACEMAKER PLACEMENT  2005  . PPM GENERATOR CHANGEOUT N/A 01/14/2017   Procedure: PPM Generator Changeout;  Surgeon: Deboraha Sprang, MD;  Location: [facility];  Service: Cardiovascular;  Laterality: N/A;  .  SKIN SURGERY     removal of a mass on cheek  . SMALL INTESTINE SURGERY  2016  . TONSILLECTOMY      Allergies  Allergen Reactions  . Tetanus Toxoid, Adsorbed Rash  . Tetanus Toxoids Rash    Allergies as of 06/15/2017      Reactions   Tetanus Toxoid, Adsorbed Rash   Tetanus Toxoids Rash      Medication List       Accurate as of 06/15/17 12:24 PM. Always use your most recent med list.          acetaminophen 325 MG tablet Commonly known as:  TYLENOL Take 2 tablets (650 mg total) by mouth every 6 (six) hours.     ASPERCREME LIDOCAINE 4 % Ptch Generic drug:  Lidocaine Apply 1 patch topically daily. Apply to area pt specifies of pain to the left ribs. Remove patch after 12 hours.   Calcium Carbonate-Vitamin D 600-200 MG-UNIT Caps Take 1 capsule by mouth daily.   Cholecalciferol 4000 units Caps Take 1 capsule by mouth daily.   docusate sodium 100 MG capsule Commonly known as:  COLACE Take 1 capsule (100 mg total) by mouth 2 (two) times daily.   ELIQUIS 2.5 MG Tabs tablet Generic drug:  apixaban TAKE ONE TABLET BY MOUTH TWICE DAILY   ENSURE Take 237 mLs by mouth 3 (three) times daily between meals.   feeding supplement (PRO-STAT SUGAR FREE 64) Liqd Take 30 mLs by mouth 2 (two) times daily between meals. 10 am , 3 pm   fluticasone 50 MCG/ACT nasal spray Commonly known as:  FLONASE Place 1 spray into both nostrils daily.   ICAPS AREDS 2 Caps Take 1 capsule by mouth daily.   MAG-AL PLUS XS [phone] MG/5ML suspension Generic drug:  alum & mag hydroxide-simeth Take 30 mLs by mouth every 4 (four) hours as needed.   magnesium hydroxide 400 MG/5ML suspension Commonly known as:  MILK OF MAGNESIA Take 30 mLs by mouth every 4 (four) hours as needed. If constipation/ no BM for 2 days   methocarbamol 500 MG tablet Commonly known as:  ROBAXIN Take 500 mg by mouth 4 (four) times daily. 1/2 tablet   metoprolol succinate 50 MG 24 hr tablet Commonly known as:  TOPROL-XL TAKE 1 TABLET (50 MG TOTAL) BY MOUTH DAILY. TAKE WITH OR IMMEDIATELY FOLLOWING A MEAL.   ondansetron 4 MG tablet Commonly known as:  ZOFRAN Take 1 tablet (4 mg total) by mouth every 6 (six) hours as needed for nausea.   oxybutynin 5 MG tablet Commonly known as:  DITROPAN TAKE 1 TABLET (5 MG TOTAL) BY MOUTH 2 (TWO) TIMES DAILY.   oxyCODONE 5 MG immediate release tablet Commonly known as:  Oxy IR/ROXICODONE Take 5 mg by mouth 3 (three) times daily. Ok to hold for sedation. Ok to give prn doses in addition to scheduled for  severe pain.   oxyCODONE 5 MG immediate release tablet Commonly known as:  Oxy IR/ROXICODONE Take 1 tablet (5 mg total) by mouth every 4 (four) hours as needed for moderate pain.   pantoprazole 40 MG tablet Commonly known as:  PROTONIX Take 40 mg by mouth daily.   polyethylene glycol packet Commonly known as:  MIRALAX / GLYCOLAX Take 17 g by mouth daily.   REFRESH 1.4-0.6 % Soln Generic drug:  Polyvinyl Alcohol-Povidone PF Place 1 drop into both eyes daily.   senna 8.6 MG Tabs tablet Commonly known as:  SENOKOT Take 1 tablet by mouth 2 (two) times  daily.   sodium chloride 0.65 % Soln nasal spray Commonly known as:  OCEAN Place 1 spray into both nostrils 2 (two) times daily as needed for congestion.   traZODone 50 MG tablet Commonly known as:  DESYREL Take 50 mg by mouth at bedtime.       Review of Systems  Constitutional: Negative for activity change, appetite change, chills, diaphoresis and fever.  HENT: Negative for congestion, sneezing, sore throat, trouble swallowing and voice change.   Respiratory: Negative for apnea, cough, choking, chest tightness, shortness of breath and wheezing.   Cardiovascular: Negative for chest pain, palpitations and leg swelling.  Gastrointestinal: Negative for abdominal distention, abdominal pain, constipation, diarrhea and nausea.  Genitourinary: Negative for difficulty urinating, dysuria, frequency and urgency.  Musculoskeletal: Positive for arthralgias (typical arthritis), neck pain and neck stiffness. Negative for back pain, gait problem and myalgias.  Skin: Positive for wound. Negative for color change, pallor and rash.  Neurological: Negative for dizziness, tremors, syncope, speech difficulty, weakness, numbness and headaches.  Psychiatric/Behavioral: Negative for agitation and behavioral problems.  All other systems reviewed and are negative.   Immunization History  Administered Date(s) Administered  . Influenza, High Dose  Seasonal PF 08/27/2016   Pertinent  Health Maintenance Due  Topic Date Due  . DEXA SCAN  07/03/1989  . PNA vac Low Risk Adult (2 of 2 - PPSV23) 09/22/2016  . INFLUENZA VACCINE  06/24/2017   Fall Risk  02/25/2017 10/20/2016 06/02/2016 05/06/2016 04/08/2016  Falls in the past year? No No No No No  Number falls in past yr: - - - - -  Injury with Fall? - - - - -  Risk Factor Category  - - - - -  Risk for fall due to : - - - - -  Follow up - - - - -   Functional Status Survey:    Vitals:   06/15/17 1210  BP: 133/69  Pulse: 72  Resp: 20  Temp: 99.1 F (37.3 C)  SpO2: 95%  Weight: 118 lb 4.8 oz (53.7 kg)  Height: 5' (1.524 m)   Body mass index is 23.1 kg/m. Physical Exam  Constitutional: She is oriented to person, place, and time. Vital signs are normal. She appears well-developed and well-nourished. She is active and cooperative. She does not appear ill. No distress. Cervical collar in place.  HENT:  Head: Normocephalic and atraumatic.  Mouth/Throat: Uvula is midline, oropharynx is clear and moist and mucous membranes are normal. Mucous membranes are not pale, not dry and not cyanotic.  Eyes: Pupils are equal, round, and reactive to light. Conjunctivae, EOM and lids are normal.  Neck: Trachea normal, normal range of motion and full passive range of motion without pain. Neck supple. No JVD present. No tracheal deviation, no edema and no erythema present. No thyromegaly present.  Cardiovascular: Normal rate, regular rhythm, normal heart sounds and intact distal pulses.  Exam reveals no gallop, no distant heart sounds and no friction rub.   No murmur heard. Pulses:      Dorsalis pedis pulses are 2+ on the right side, and 2+ on the left side.  Pulmonary/Chest: Effort normal and breath sounds normal. No accessory muscle usage. No respiratory distress. She has no decreased breath sounds. She has no wheezes. She has no rhonchi. She has no rales. She exhibits no tenderness.  Abdominal: Soft.  Normal appearance and bowel sounds are normal. She exhibits no distension and no ascites. There is no tenderness.  Musculoskeletal: She exhibits no  edema or tenderness.       Cervical back: She exhibits decreased range of motion, bony tenderness and pain.  Expected osteoarthritis, stiffness; C-collar in place; Calves soft, supple, negative homan's sign; pelvic fractures  Neurological: She is alert and oriented to person, place, and time. She has normal strength.  Skin: Skin is warm, dry and intact. No rash noted. She is not diaphoretic. No cyanosis or erythema. No pallor. Nails show no clubbing.     Psychiatric: She has a normal mood and affect. Her speech is normal and behavior is normal. Judgment and thought content normal. Cognition and memory are normal.  Nursing note and vitals reviewed.   Labs reviewed:  Recent Labs  05/19/17 0517 05/22/17 0630 06/09/17 1100  NA 132* 131* 137  K 4.5 3.9 4.1  CL 103 104 104  CO2 23 22 26   GLUCOSE 105* 87 130*  BUN 16 17 25*  CREATININE 1.01* 0.81 0.84  CALCIUM 8.4* 8.3* 8.6*    Recent Labs  05/17/17 1806 05/22/17 0630 06/09/17 1100  AST 21 14* 18  ALT 12* 8* 7*  ALKPHOS 52 37* 301*  BILITOT 1.0 0.9 0.6  PROT 6.2* 5.4* 5.8*  ALBUMIN 3.6 2.7* 2.9*    Recent Labs  05/19/17 1417 05/20/17 0700 05/22/17 0630 06/09/17 1100  WBC 10.6* 9.1 6.6 6.2  NEUTROABS 8.6*  --  4.0 4.1  HGB 10.5* 9.5* 9.9* 11.3*  HCT 32.8* 28.8* 29.2* 34.2*  MCV 92.9 92.3 91.0 95.0  PLT 182 176 249 342   No results found for: TSH No results found for: HGBA1C No results found for: CHOL, HDL, LDLCALC, LDLDIRECT, TRIG, CHOLHDL  Significant Diagnostic Results in last 30 days:  Dg Ribs Unilateral W/chest Left  Result Date: 05/17/2017 CLINICAL DATA:  Per EMS pt fell at home unwitnessed. Pt states no LOC and no dizziness. Pt states she stood up with her walker and tried to pull her pants up and tripped and fell on her left side. Pt states pain 10/10- mostly  left hip and side/shoulder. EXAM: LEFT RIBS AND CHEST - 3+ VIEW COMPARISON:  Chest x-ray dated 10/20/2016. FINDINGS: Cardiomegaly is stable. Atherosclerotic changes noted at the aortic arch. Probable large hiatal hernia. Chronic blunting at the left costophrenic angle, chronic small pleural effusion versus pleural thickening. Lungs otherwise clear. No new lung findings. No pneumothorax seen. Displaced fracture of an upper left anterior-lateral rib, uncertain number. IMPRESSION: 1. Displaced fracture of an upper left-sided rib, anterior-lateral aspect, uncertain number, only seen on 1 of the rib views. 2. Stable cardiomegaly. 3. Probable large hiatal hernia. 4. No new lung findings.  No pneumothorax seen. Electronically Signed   By: Franki Cabot M.D.   On: 05/17/2017 14:29   Dg Thoracic Spine 2 View  Result Date: 05/17/2017 CLINICAL DATA:  Per EMS pt fell at home unwitnessed. Pt states no LOC and no dizziness. Pt states she stood up with her walker and tried to pull her pants up and tripped and fell on her left side. Pt states pain 10/10- mostly left hip and side/shoulder. EXAM: THORACIC SPINE 2 VIEWS COMPARISON:  None. FINDINGS: Marked kyphosis. Chronic appearing compression deformities of a lower thoracic vertebral body and upper lumbar vertebral body. No acute appearing osseous abnormality. IMPRESSION: 1. Chronic appearing compression deformities of a lower thoracic vertebral body and an upper lumbar vertebral body. 2. Marked kyphosis. 3. The compression fracture deformities of T1 and T2 are better seen on today's cervical spine CT. Please see the CT report.  Electronically Signed   By: Franki Cabot M.D.   On: 05/17/2017 14:34   Dg Ankle 2 Views Right  Result Date: 05/18/2017 CLINICAL DATA:  Fall yesterday EXAM: RIGHT ANKLE - 2 VIEW COMPARISON:  None. FINDINGS: There is no oblique view. This limits the examination. There is no obvious fracture or dislocation. Osteopenia. Spurring at the posterior and  inferior calcaneus is minimal. IMPRESSION: Limited exam.  No obvious acute injury.  Chronic changes. Electronically Signed   By: Marybelle Killings M.D.   On: 05/18/2017 10:10   Ct Head Wo Contrast  Result Date: 05/17/2017 CLINICAL DATA:  Unwitnessed fall. EXAM: CT HEAD WITHOUT CONTRAST CT CERVICAL SPINE WITHOUT CONTRAST TECHNIQUE: Multidetector CT imaging of the head and cervical spine was performed following the standard protocol without intravenous contrast. Multiplanar CT image reconstructions of the cervical spine were also generated. COMPARISON:  None. FINDINGS: CT HEAD FINDINGS Brain: Generalized age related parenchymal atrophy with commensurate dilatation of the ventricles and sulci. Mild chronic small vessel ischemic changes in the periventricular white matter. There is no mass, hemorrhage, edema or other evidence of acute parenchymal abnormality. No extra-axial hemorrhage. Vascular: There are chronic calcified atherosclerotic changes of the large vessels at the skull base. No unexpected hyperdense vessel. Skull: Normal. Negative for fracture or focal lesion. Sinuses/Orbits: No acute finding. Other: None. CT CERVICAL SPINE FINDINGS Alignment: Dextroscoliosis of the lower cervical spine. No evidence of acute vertebral body subluxation. Skull base and vertebrae: Compression fracture deformities of the T1 and T2 vertebral bodies, both compressed approximately 50%, both with minimal retropulsion of the posterior cortex without significant central canal stenosis, both of uncertain age. No fracture line or displaced fracture fragment identified within the cervical spine. Facet joints appear intact and normally aligned throughout. Soft tissues and spinal canal: No prevertebral fluid or swelling. No visible canal hematoma. Disc levels: Disc desiccations throughout the cervical and upper thoracic spine but no more than mild central canal stenosis at any level. Upper chest: No acute findings.  Aortic atherosclerosis.  Other: Carotid atherosclerosis. IMPRESSION: 1. No acute intracranial abnormality. No intracranial mass, hemorrhage or edema. No skull fracture. Atrophy and chronic ischemic changes in white matter. 2. Compression fracture deformities of the T1 and T2 vertebral bodies, both compressed approximately 50%, both with minimal retropulsion of the posterior cortex without significant central canal stenosis, both of uncertain age but more suggestive of acute fractures based on the appearance on sagittal reconstructions. If acute, the involvement of the anterior and posterior portions of the vertebral bodies would be concerning for unstable fractures. No fracture extension is seen into the posterior elements. 3. Degenerative changes throughout the cervical and upper thoracic spine, but no significant central canal stenosis at any level. 4. Aortic atherosclerosis.  Carotid atherosclerosis. These results were called by telephone at the time of interpretation on 05/17/2017 at 2:20 pm to Dr. Lisa Roca , who verbally acknowledged these results. Electronically Signed   By: Franki Cabot M.D.   On: 05/17/2017 14:08   Ct Cervical Spine Wo Contrast  Result Date: 05/17/2017 CLINICAL DATA:  Unwitnessed fall. EXAM: CT HEAD WITHOUT CONTRAST CT CERVICAL SPINE WITHOUT CONTRAST TECHNIQUE: Multidetector CT imaging of the head and cervical spine was performed following the standard protocol without intravenous contrast. Multiplanar CT image reconstructions of the cervical spine were also generated. COMPARISON:  None. FINDINGS: CT HEAD FINDINGS Brain: Generalized age related parenchymal atrophy with commensurate dilatation of the ventricles and sulci. Mild chronic small vessel ischemic changes in the periventricular white matter. There  is no mass, hemorrhage, edema or other evidence of acute parenchymal abnormality. No extra-axial hemorrhage. Vascular: There are chronic calcified atherosclerotic changes of the large vessels at the skull  base. No unexpected hyperdense vessel. Skull: Normal. Negative for fracture or focal lesion. Sinuses/Orbits: No acute finding. Other: None. CT CERVICAL SPINE FINDINGS Alignment: Dextroscoliosis of the lower cervical spine. No evidence of acute vertebral body subluxation. Skull base and vertebrae: Compression fracture deformities of the T1 and T2 vertebral bodies, both compressed approximately 50%, both with minimal retropulsion of the posterior cortex without significant central canal stenosis, both of uncertain age. No fracture line or displaced fracture fragment identified within the cervical spine. Facet joints appear intact and normally aligned throughout. Soft tissues and spinal canal: No prevertebral fluid or swelling. No visible canal hematoma. Disc levels: Disc desiccations throughout the cervical and upper thoracic spine but no more than mild central canal stenosis at any level. Upper chest: No acute findings.  Aortic atherosclerosis. Other: Carotid atherosclerosis. IMPRESSION: 1. No acute intracranial abnormality. No intracranial mass, hemorrhage or edema. No skull fracture. Atrophy and chronic ischemic changes in white matter. 2. Compression fracture deformities of the T1 and T2 vertebral bodies, both compressed approximately 50%, both with minimal retropulsion of the posterior cortex without significant central canal stenosis, both of uncertain age but more suggestive of acute fractures based on the appearance on sagittal reconstructions. If acute, the involvement of the anterior and posterior portions of the vertebral bodies would be concerning for unstable fractures. No fracture extension is seen into the posterior elements. 3. Degenerative changes throughout the cervical and upper thoracic spine, but no significant central canal stenosis at any level. 4. Aortic atherosclerosis.  Carotid atherosclerosis. These results were called by telephone at the time of interpretation on 05/17/2017 at 2:20 pm to Dr.  Lisa Roca , who verbally acknowledged these results. Electronically Signed   By: Franki Cabot M.D.   On: 05/17/2017 14:08   Ct Abdomen Pelvis W Contrast  Result Date: 05/17/2017 CLINICAL DATA:  Ground level fall with pelvic fractures EXAM: CT ABDOMEN AND PELVIS WITH CONTRAST TECHNIQUE: Multidetector CT imaging of the abdomen and pelvis was performed using the standard protocol following bolus administration of intravenous contrast. CONTRAST:  17mL ISOVUE-300 IOPAMIDOL (ISOVUE-300) INJECTION 61% COMPARISON:  04/11/2017, 05/17/2017, 10/08/2016 FINDINGS: Lower chest: Patchy dependent atelectasis within the posterior right lung base. Trace left pleural effusion. Large hiatal hernia. Cardiomegaly with partially visualized pacing leads. Coronary artery calcifications. Hepatobiliary: Moderate to marked intra and extrahepatic biliary dilatation, slightly progressed since the previous exam, the extrahepatic bile duct measures up to 16 mm. Small calcified stones in the gallbladder. Possible small focus of fat infiltration near the falciform ligament. Pancreas: No inflammation.  Diffuse atrophy. Spleen: Normal in size without focal abnormality. Adrenals/Urinary Tract: Adrenal glands are within normal limits. Mildly atrophic kidneys. No hydronephrosis. The bladder is unremarkable Stomach/Bowel: Stomach is nonenlarged. No dilated small bowel. No colon wall thickening. Moderate to large amount of stool within the colon Vascular/Lymphatic: Extensive atherosclerotic vascular calcification. No aneurysmal dilatation. No significantly enlarged lymph nodes Reproductive: Status post hysterectomy. No adnexal masses. Other: No free air or free fluid. Musculoskeletal: Old right inferior and superior pubic rami fractures. Acute, mildly displaced and comminuted left inferior and superior pubic rami fractures with extension to the pubic symphysis. No significant widening. Small left pelvic sidewall hematoma and small amount of edema/  hematoma around the left pubic symphysis. Old compression deformities of L2 and L5. Extensive degenerative changes IMPRESSION: 1. No CT  evidence for acute solid organ injury or free air 2. Acute, mildly displaced and comminuted left inferior and superior pubic rami fractures with extension to the left pubic symphysis. No significant widening of the symphysis. Small left pelvic sidewall hematoma and small amount of edema/hematoma around the left pubic symphysis. 3. Old compression fractures of L2 and L5 4. Moderate intra and extrahepatic biliary dilatation, increased compared to prior and again concerning for distal obstructing process. Gallstones 5. Large hiatal hernia Electronically Signed   By: Donavan Foil M.D.   On: 05/17/2017 20:56   Dg Chest Port 1 View  Result Date: 05/17/2017 CLINICAL DATA:  Recent fall EXAM: PORTABLE CHEST 1 VIEW COMPARISON:  05/17/2017 FINDINGS: Cardiac shadow remains enlarged. Large hiatal hernia is again seen. No pneumothorax is noted. The known left rib fracture is not well appreciated on this exam. The lungs are clear. No other focal abnormality is noted. Pacing device is again seen and stable. IMPRESSION: No acute abnormality noted. Chronic changes as described. Electronically Signed   By: Inez Catalina M.D.   On: 05/17/2017 19:39   Dg Hip Unilat W Or Wo Pelvis 2-3 Views Left  Result Date: 05/17/2017 CLINICAL DATA:  Unwitnessed fall. EXAM: DG HIP (WITH OR WITHOUT PELVIS) 2-3V LEFT COMPARISON:  None. FINDINGS: Single view of the pelvis and two views of the left hip are provided. Displaced fractures of the left superior and inferior pubic rami, with probable involvement of the symphysis pubis. Probable displaced fracture within the lateral aspect of the right superior pubic ramus. Remainder of the osseous pelvis appears intact and normally aligned, although portions of the sacrum are obscured by overlying bowel gas. Both femoral heads appear appropriately positioned relative to  the acetabuli. No fracture seen within the femoral heads or femoral necks. IMPRESSION: 1. Displaced fractures of the left pubic rami, with probable involvement of the left symphysis pubis. 2. Probable displaced fracture within the right superior pubic ramus. 3. No fracture or dislocation at the left hip. Electronically Signed   By: Franki Cabot M.D.   On: 05/17/2017 14:32   Dg Femur Port Min 2 Views Left  Result Date: 05/17/2017 CLINICAL DATA:  Recent fall with left leg pain, initial encounter EXAM: LEFT FEMUR PORTABLE 2 VIEWS COMPARISON:  Pelvis films from earlier in the same day FINDINGS: Previously seen left pubic rami fractures are incompletely evaluated. The left femur is intact without acute fracture. Degenerative changes of the hip joint and knee joint are seen. Diffuse vascular calcifications are noted. IMPRESSION: Intact femur with degenerative changes in both the hip and knee joint. The known left pubic rami fractures are better visualized on recent pelvis film. Electronically Signed   By: Inez Catalina M.D.   On: 05/17/2017 19:35    Assessment/Plan 1. Decubitus ulcer of back, unstageable (HCC)  Santyl ointment- apply thick layer to the wound  Then apply NS wet to dry gauze  Cover with Allevyn dressing  Change Q Day  Vitamin C 500 mg po BID for wound healing  Geomat and Geocushion to the bed and chair to off-set pressure  Clindamycin 300 mg po Q 8 hours x 7 days  2. Age-related osteoporosis with current pathological fracture with routine healing  Continue PT/OT  Cervical collar at all times  Continue exercises as taught by PT/OT  Fall precautions  Safety precautions  3. Pain, acute due to trauma  Continue Tylenol 650 mg po QID scheduled  Continue Oxycodone 5 mg 1 tablet po Q 4 hours prn  pain  Continue Oxycodone 5 mg 1 tablet po TID scheduled  Continue Methocarbamol 500 mg- 1/2 tablet po QID  Gabapentin 100 mg po TID  Family/ staff Communication:   Total  Time:  Documentation:  Face to Face:  Family/Phone:   Labs/tests ordered:    Medication list reviewed and assessed for continued appropriateness. Monthly medication orders reviewed and signed.  Vikki Ports, NP-C Geriatrics [facility] [phone], [place] [address] Cell Phone (Mon-Fri 8am-5pm):  [phone] On Call:  [phone] & follow prompts after 5pm & weekends Office Phone:  [phone] Office Fax:  [phone]

## 2017-06-16 ENCOUNTER — Telehealth: Payer: Self-pay | Admitting: Family Medicine

## 2017-06-16 NOTE — Telephone Encounter (Signed)
Pt niece Webb Silversmith called and cancelled pt's appt. Pt will be in long term skilled nursing. Pt niece would like to speak with Dr. Caryl Bis. Please advise, thank you!  Call Anne @ [phone]

## 2017-06-16 NOTE — Telephone Encounter (Signed)
Noted. Please let the patient's niece know that I'm sorry to hear about her fall and that I appreciate her kind words. Thanks.

## 2017-06-16 NOTE — Telephone Encounter (Signed)
Patients niece states the patient fell 3 weeks ago, she was sitting on her bed and fell. Patient has two thoracic fractures per niece. Four pelvis fractures, unsure if new or not. Unable to do MRI due to pacemaker to verify. Patient is now at [facility] long term care/skilled nursing.Patients niece would like for you to know she appreciates everything you have done for her aunt and that her aunt liked you very much.

## 2017-06-17 NOTE — Telephone Encounter (Signed)
Notified. 

## 2017-06-18 ENCOUNTER — Other Ambulatory Visit: Payer: Self-pay

## 2017-06-18 MED ORDER — OXYCODONE HCL 5 MG PO TABS: 5.0000 mg | ORAL_TABLET | ORAL | 0 refills | Status: AC | PRN

## 2017-06-18 NOTE — Telephone Encounter (Signed)
Rx sent to [facility] phone : [phone] , fax : [phone]  

## 2017-06-19 ENCOUNTER — Ambulatory Visit: Payer: PPO | Admitting: Family Medicine

## 2017-06-22 DIAGNOSIS — S21209A Unspecified open wound of unspecified back wall of thorax without penetration into thoracic cavity, initial encounter: Secondary | ICD-10-CM | POA: Diagnosis not present

## 2017-06-24 ENCOUNTER — Encounter
Admission: RE | Admit: 2017-06-24 | Discharge: 2017-06-24 | Disposition: A | Discharge status: Home / Self Care | Payer: PPO | Source: Ambulatory Visit | Attending: Internal Medicine | Admitting: Internal Medicine

## 2017-06-24 DIAGNOSIS — R1311 Dysphagia, oral phase: Secondary | ICD-10-CM | POA: Diagnosis not present

## 2017-06-24 DIAGNOSIS — G8929 Other chronic pain: Secondary | ICD-10-CM | POA: Diagnosis not present

## 2017-06-24 DIAGNOSIS — M4120 Other idiopathic scoliosis, site unspecified: Secondary | ICD-10-CM | POA: Diagnosis not present

## 2017-06-26 DIAGNOSIS — L89109 Pressure ulcer of unspecified part of back, unspecified stage: Secondary | ICD-10-CM | POA: Insufficient documentation

## 2017-06-29 ENCOUNTER — Other Ambulatory Visit
Admission: RE | Admit: 2017-06-29 | Discharge: 2017-06-29 | Disposition: A | Discharge status: Home / Self Care | Payer: PPO | Source: Ambulatory Visit | Attending: Gerontology | Admitting: Gerontology

## 2017-06-29 ENCOUNTER — Other Ambulatory Visit
Admission: RE | Admit: 2017-06-29 | Discharge: 2017-06-29 | Disposition: A | Discharge status: Home / Self Care | Payer: PPO | Source: Ambulatory Visit | Attending: Internal Medicine | Admitting: Internal Medicine

## 2017-06-29 ENCOUNTER — Non-Acute Institutional Stay (SKILLED_NURSING_FACILITY): Payer: PPO | Admitting: Gerontology

## 2017-06-29 DIAGNOSIS — R5383 Other fatigue: Secondary | ICD-10-CM | POA: Insufficient documentation

## 2017-06-29 DIAGNOSIS — R5381 Other malaise: Secondary | ICD-10-CM | POA: Insufficient documentation

## 2017-06-29 DIAGNOSIS — L89100 Pressure ulcer of unspecified part of back, unstageable: Secondary | ICD-10-CM | POA: Diagnosis not present

## 2017-06-29 LAB — CBC WITH DIFFERENTIAL/PLATELET
Basophils Absolute: 0.1 10*3/uL (ref 0–0.1)
Basophils Relative: 0 %
EOS ABS: 0 10*3/uL (ref 0–0.7)
Eosinophils Relative: 0 %
HEMATOCRIT: 35.8 % (ref 35.0–47.0)
HEMOGLOBIN: 11.9 g/dL — AB (ref 12.0–16.0)
LYMPHS ABS: 1.6 10*3/uL (ref 1.0–3.6)
Lymphocytes Relative: 10 %
MCH: 31.2 pg (ref 26.0–34.0)
MCHC: 33.2 g/dL (ref 32.0–36.0)
MCV: 94 fL (ref 80.0–100.0)
MONOS PCT: 7 %
Monocytes Absolute: 1.1 10*3/uL — ABNORMAL HIGH (ref 0.2–0.9)
NEUTROS ABS: 13.8 10*3/uL — AB (ref 1.4–6.5)
NEUTROS PCT: 83 %
PLATELETS: 648 10*3/uL — AB (ref 150–440)
RBC: 3.81 MIL/uL (ref 3.80–5.20)
RDW: 15.5 % — ABNORMAL HIGH (ref 11.5–14.5)
WBC: 16.6 10*3/uL — ABNORMAL HIGH (ref 3.6–11.0)

## 2017-06-29 LAB — COMPREHENSIVE METABOLIC PANEL
ALT: 8 U/L — AB (ref 14–54)
AST: 16 U/L (ref 15–41)
Albumin: 3.4 g/dL — ABNORMAL LOW (ref 3.5–5.0)
Alkaline Phosphatase: 141 U/L — ABNORMAL HIGH (ref 38–126)
Anion gap: 16 — ABNORMAL HIGH (ref 5–15)
BUN: 58 mg/dL — ABNORMAL HIGH (ref 6–20)
CHLORIDE: 104 mmol/L (ref 101–111)
CO2: 18 mmol/L — ABNORMAL LOW (ref 22–32)
CREATININE: 1.16 mg/dL — AB (ref 0.44–1.00)
Calcium: 10.3 mg/dL (ref 8.9–10.3)
GFR, EST AFRICAN AMERICAN: 46 mL/min — AB (ref >60)
GFR, EST NON AFRICAN AMERICAN: 40 mL/min — AB (ref >60)
Glucose, Bld: 113 mg/dL — ABNORMAL HIGH (ref 65–99)
POTASSIUM: 3.7 mmol/L (ref 3.5–5.1)
Sodium: 138 mmol/L (ref 135–145)
TOTAL PROTEIN: 7.1 g/dL (ref 6.5–8.1)
Total Bilirubin: 0.7 mg/dL (ref 0.3–1.2)

## 2017-06-29 LAB — URINALYSIS, COMPLETE (UACMP) WITH MICROSCOPIC
BILIRUBIN URINE: NEGATIVE
GLUCOSE, UA: NEGATIVE mg/dL
Hgb urine dipstick: NEGATIVE
Ketones, ur: 5 mg/dL — AB
NITRITE: NEGATIVE
PH: 5 (ref 5.0–8.0)
Protein, ur: NEGATIVE mg/dL
SPECIFIC GRAVITY, URINE: 1.032 — AB (ref 1.005–1.030)

## 2017-06-29 LAB — TSH: TSH: 5.675 u[IU]/mL — AB (ref 0.350–4.500)

## 2017-06-29 LAB — VITAMIN B12: Vitamin B-12: 955 pg/mL — ABNORMAL HIGH (ref 180–914)

## 2017-06-29 LAB — MAGNESIUM: MAGNESIUM: 2.2 mg/dL (ref 1.7–2.4)

## 2017-06-29 LAB — C-REACTIVE PROTEIN: CRP: 4.2 mg/dL — ABNORMAL HIGH (ref <1.0)

## 2017-06-29 LAB — SEDIMENTATION RATE: SED RATE: 57 mm/h — AB (ref 0–30)

## 2017-06-30 ENCOUNTER — Encounter: Payer: Self-pay | Admitting: Gerontology

## 2017-06-30 LAB — VITAMIN D 25 HYDROXY (VIT D DEFICIENCY, FRACTURES): VIT D 25 HYDROXY: 36.9 ng/mL (ref 30.0–100.0)

## 2017-06-30 NOTE — Progress Notes (Signed)
Location:   [place] [facility] [place] Room Number: 202B Place of Service:  SNF [phone]) Provider:  Toni Arthurs, NP-C  Caryl Bis Angela Adam, MD  Patient Care Team: Leone Haven, MD as PCP - General (Family Medicine)  Extended Emergency Contact Information Primary Emergency Contact: [place] Phone: [phone] Mobile Phone: [phone] Relation: Niece Secondary Emergency Contact: Rolly Salter" Address: [address]          [place], [place] [address] Johnnette Litter of [place] Phone: [phone] Mobile Phone: [phone] Relation: Other  Code Status:  DNR Goals of care: Advanced Directive information Advanced Directives 06/29/2017  Does Patient Have a Medical Advance Directive? Yes  Type of Advance Directive Out of facility DNR (pink MOST or yellow form)  Does patient want to make changes to medical advance directive? No - Patient declined  Copy of [place] in Chart? -  Would patient like information on creating a medical advance directive? -     Chief Complaint  Patient presents with  . Acute Visit    Follow up on wound and malaise     HPI:  Pt is a 83 y.o. female seen today for an acute visit for increased malaise with confusion as well as wound check. Staff concerned as pt has seemed more lethargic today. Staff also reports some intermittent confusion and decreased appetite. Upon questioning, pt reports she is generally feeling well, but does endorse fatigue. She asked "Why is [she] sleeping so much?" She denies n/v/d/f/c/cp/sob/ha/abd pain/dizziness/cough. Pt reports her pain is controlled. She does also endorse decreased appetite and decreased thirst. Pt's skin and mucus membranes does appear somewhat dry. She appears weak. Wound on back continues to appear to have a "leathery" looking eschar over the wound. Minimal drainage. Continues to have some odor, redness and erythema in the peri-wound tissues  with edema. Pt reports the area is tender to touch. Spinal xrays negative for osteomyelitis. Suspect there is early osteomyelitis. Will change antibiotics to IV. Nursing to talk to family about sending for surgical consult. VSS. No other complaints.    Past Medical History:  Diagnosis Date  . Age-related osteoporosis with current pathological fracture with routine healing 05/21/2017  . Arthritis   . Atrial fibrillation ([place])   . Carcinoma of the skin, basal cell   . CHF (congestive heart failure) ([place])   . Chickenpox   . CKD (chronic kidney disease)   . GERD (gastroesophageal reflux disease)   . Hiatal hernia 11/25/2016  . HTN, goal below 140/90 05/21/2017  . Hypertension   . Low sodium levels   . Pacemaker   . Primary osteoarthritis involving multiple joints 05/21/2017  . Urge incontinence   . UTI (lower urinary tract infection)    Past Surgical History:  Procedure Laterality Date  . ABDOMINAL HYSTERECTOMY    . APPENDECTOMY    . BASAL CELL CARCINOMA EXCISION Left    cheek  . broken pelvis    . BUNIONECTOMY Bilateral   . CARDIAC CATHETERIZATION    . CORONARY ANGIOGRAM  2015   [place]   . CORONARY ANGIOGRAM  2015  . HEMORRHOID SURGERY    . PACEMAKER INSERTION  12/25/2008   [employer] pacemaker DRRF 2210 Serial [id number]  . PACEMAKER PLACEMENT  2005  . PPM GENERATOR CHANGEOUT N/A 01/14/2017   Procedure: PPM Generator Changeout;  Surgeon: Deboraha Sprang, MD;  Location: [facility];  Service: Cardiovascular;  Laterality: N/A;  . SKIN SURGERY  removal of a mass on cheek  . SMALL INTESTINE SURGERY  2016  . TONSILLECTOMY      Allergies  Allergen Reactions  . Tetanus Toxoid, Adsorbed Rash  . Tetanus Toxoids Rash    Allergies as of 06/29/2017      Reactions   Tetanus Toxoid, Adsorbed Rash   Tetanus Toxoids Rash      Medication List       Accurate as of 06/29/17 11:59 PM. Always use your most recent med list.          acetaminophen 325 MG tablet Commonly known  as:  TYLENOL Take 2 tablets (650 mg total) by mouth every 6 (six) hours.   ASPERCREME LIDOCAINE 4 % Ptch Generic drug:  Lidocaine Apply 1 patch topically daily. Apply to area pt specifies of pain to the left ribs. Remove patch after 12 hours.   Calcium Carbonate-Vitamin D 600-200 MG-UNIT Caps Take 1 capsule by mouth daily.   Cholecalciferol 4000 units Caps Take 1 capsule by mouth daily.   docusate sodium 100 MG capsule Commonly known as:  COLACE Take 1 capsule (100 mg total) by mouth 2 (two) times daily.   ELIQUIS 2.5 MG Tabs tablet Generic drug:  apixaban TAKE ONE TABLET BY MOUTH TWICE DAILY   ENSURE Take 237 mLs by mouth 3 (three) times daily between meals.   feeding supplement (PRO-STAT SUGAR FREE 64) Liqd Take 30 mLs by mouth 2 (two) times daily between meals. 10 am , 3 pm   fluticasone 50 MCG/ACT nasal spray Commonly known as:  FLONASE Place 1 spray into both nostrils daily.   gabapentin 100 MG capsule Commonly known as:  NEURONTIN Take 100 mg by mouth 3 (three) times daily.   ICAPS AREDS 2 Caps Take 1 capsule by mouth daily.   MAG-AL PLUS XS 034-742-59 MG/5ML suspension Generic drug:  alum & mag hydroxide-simeth Take 30 mLs by mouth every 4 (four) hours as needed.   magnesium hydroxide 400 MG/5ML suspension Commonly known as:  MILK OF MAGNESIA Take 30 mLs by mouth every 4 (four) hours as needed. If constipation/ no BM for 2 days   methocarbamol 500 MG tablet Commonly known as:  ROBAXIN Take 250 mg by mouth 4 (four) times daily. 1/2 tablet   metoprolol succinate 50 MG 24 hr tablet Commonly known as:  TOPROL-XL TAKE 1 TABLET (50 MG TOTAL) BY MOUTH DAILY. TAKE WITH OR IMMEDIATELY FOLLOWING A MEAL.   ondansetron 4 MG tablet Commonly known as:  ZOFRAN Take 1 tablet (4 mg total) by mouth every 6 (six) hours as needed for nausea.   oxybutynin 5 MG tablet Commonly known as:  DITROPAN TAKE 1 TABLET (5 MG TOTAL) BY MOUTH 2 (TWO) TIMES DAILY.   oxyCODONE 5 MG  immediate release tablet Commonly known as:  Oxy IR/ROXICODONE Take 5 mg by mouth 3 (three) times daily. Ok to hold for sedation. Ok to give prn doses in addition to scheduled for severe pain.   oxyCODONE 5 MG immediate release tablet Commonly known as:  Oxy IR/ROXICODONE Take 1 tablet (5 mg total) by mouth every 4 (four) hours as needed for moderate pain.   pantoprazole 40 MG tablet Commonly known as:  PROTONIX Take 40 mg by mouth daily.   polyethylene glycol packet Commonly known as:  MIRALAX / GLYCOLAX Take 17 g by mouth daily.   REFRESH 1.4-0.6 % Soln Generic drug:  Polyvinyl Alcohol-Povidone PF Place 1 drop into both eyes daily.   SANTYL ointment Generic drug:  collagenase Apply liberal amount topically to wound on back daily , cover with NS damp to dry dressing. Cover with dry gauze and Allevyn dressing. Change daily.   senna 8.6 MG Tabs tablet Commonly known as:  SENOKOT Take 2 tablets by mouth 2 (two) times daily.   sodium chloride 0.65 % Soln nasal spray Commonly known as:  OCEAN Place 1 spray into both nostrils 2 (two) times daily as needed for congestion.   traZODone 50 MG tablet Commonly known as:  DESYREL Take 50 mg by mouth at bedtime.   vitamin C 500 MG tablet Commonly known as:  ASCORBIC ACID Take 500 mg by mouth 2 (two) times daily.       Review of Systems  Constitutional: Positive for activity change, appetite change and fatigue. Negative for chills, diaphoresis and fever.  HENT: Negative for congestion, sneezing, sore throat, trouble swallowing and voice change.   Respiratory: Negative for apnea, cough, choking, chest tightness, shortness of breath and wheezing.   Cardiovascular: Negative for chest pain, palpitations and leg swelling.  Gastrointestinal: Negative for abdominal distention, abdominal pain, constipation, diarrhea and nausea.  Genitourinary: Negative for difficulty urinating, dysuria, frequency and urgency.  Musculoskeletal: Positive  for arthralgias (typical arthritis), neck pain and neck stiffness. Negative for back pain, gait problem and myalgias.  Skin: Positive for wound. Negative for color change, pallor and rash.  Neurological: Positive for weakness. Negative for dizziness, tremors, syncope, speech difficulty, numbness and headaches.  Psychiatric/Behavioral: Positive for confusion. Negative for agitation and behavioral problems.  All other systems reviewed and are negative.   Immunization History  Administered Date(s) Administered  . Influenza, High Dose Seasonal PF 08/27/2016   Pertinent  Health Maintenance Due  Topic Date Due  . DEXA SCAN  07/03/1989  . PNA vac Low Risk Adult (2 of 2 - PPSV23) 09/22/2016  . INFLUENZA VACCINE  06/24/2017   Fall Risk  02/25/2017 10/20/2016 06/02/2016 05/06/2016 04/08/2016  Falls in the past year? No No No No No  Comment - - - - -  Number falls in past yr: - - - - -  Injury with Fall? - - - - -  Risk Factor Category  - - - - -  Risk for fall due to : - - - - -  Follow up - - - - -  Comment - - - - -   Functional Status Survey:    Vitals:   06/29/17 1225  BP: (!) 166/87  Pulse: 74  Resp: 16  Temp: 97.6 F (36.4 C)  SpO2: 97%  Weight: 118 lb 4.8 oz (53.7 kg)  Height: 5' (1.524 m)   Body mass index is 23.1 kg/m. Physical Exam  Constitutional: She is oriented to person, place, and time. Vital signs are normal. She appears well-developed and well-nourished. She appears lethargic. She is active and cooperative. She is easily aroused. She appears ill. No distress. Cervical collar in place.  HENT:  Head: Normocephalic and atraumatic.  Mouth/Throat: Uvula is midline and oropharynx is clear and moist. Mucous membranes are not pale, dry (somewhat) and not cyanotic.  Eyes: Pupils are equal, round, and reactive to light. Conjunctivae, EOM and lids are normal.  Neck: Trachea normal, normal range of motion and full passive range of motion without pain. Neck supple. No JVD  present. No tracheal deviation, no edema and no erythema present. No thyromegaly present.  Cardiovascular: Normal rate, regular rhythm, normal heart sounds and intact distal pulses.  Exam reveals no gallop, no distant heart  sounds and no friction rub.   No murmur heard. Pulses:      Dorsalis pedis pulses are 2+ on the right side, and 2+ on the left side.  Pulmonary/Chest: Effort normal and breath sounds normal. No accessory muscle usage. No respiratory distress. She has no decreased breath sounds. She has no wheezes. She has no rhonchi. She has no rales. She exhibits no tenderness.  Abdominal: Soft. Normal appearance and bowel sounds are normal. She exhibits no distension and no ascites. There is no tenderness.  Musculoskeletal: She exhibits no edema or tenderness.       Cervical back: She exhibits decreased range of motion, bony tenderness and pain.  Expected osteoarthritis, stiffness; C-collar in place; Calves soft, supple, negative homan's sign; pelvic fractures  Neurological: She is oriented to person, place, and time and easily aroused. She has normal strength. She appears lethargic.  Skin: Skin is warm, dry and intact. No rash noted. She is not diaphoretic. No cyanosis or erythema. No pallor. Nails show no clubbing.     Psychiatric: She has a normal mood and affect. Her speech is normal and behavior is normal. Judgment and thought content normal. Cognition and memory are normal.  Nursing note and vitals reviewed.   Labs reviewed:  Recent Labs  05/22/17 0630 06/09/17 1100 06/29/17 1610  NA 131* 137 138  K 3.9 4.1 3.7  CL 104 104 104  CO2 22 26 18*  GLUCOSE 87 130* 113*  BUN 17 25* 58*  CREATININE 0.81 0.84 1.16*  CALCIUM 8.3* 8.6* 10.3  MG  --   --  2.2    Recent Labs  05/22/17 0630 06/09/17 1100 06/29/17 1610  AST 14* 18 16  ALT 8* 7* 8*  ALKPHOS 37* 301* 141*  BILITOT 0.9 0.6 0.7  PROT 5.4* 5.8* 7.1  ALBUMIN 2.7* 2.9* 3.4*    Recent Labs  05/22/17 0630  06/09/17 1100 06/29/17 1610  WBC 6.6 6.2 16.6*  NEUTROABS 4.0 4.1 13.8*  HGB 9.9* 11.3* 11.9*  HCT 29.2* 34.2* 35.8  MCV 91.0 95.0 94.0  PLT 249 342 648*   Lab Results  Component Value Date   TSH 5.675 (H) 06/29/2017   No results found for: HGBA1C No results found for: CHOL, HDL, LDLCALC, LDLDIRECT, TRIG, CHOLHDL  Significant Diagnostic Results in last 30 days:  No results found.  Assessment/Plan 1. Decubitus ulcer of back, unstageable ([place])  DC Dakin's sloution  Santyl ointment- apply thick layer to the wound  Then apply NS wet to dry gauze  Cover with Allevyn dressing  Change BID  Vitamin C 500 mg po BID for wound healing  Geomat and Geocushion to the bed and chair to off-set pressure  Clindamycin 600 mg IV Q 8 hours x 7 days  2. Malaise  Sodium chloride 0.9 % parenteral solution; intravenous- Give 250 mL 0.9% NS bolus over 1 hour, then continuous at 75 mL/ hr x 48 hours for hydration  Labs   Family/ staff Communication:   Total Time:  Documentation:  Face to Face:  Family/Phone:   Labs/tests ordered:  CBC PROFILE; MAGNESIUM; METC; SED RATE; TSH; VIT B12 LEVEL; VIT D LEVEL; CRP; UA, C&S  Medication list reviewed and assessed for continued appropriateness.  Vikki Ports, NP-C Geriatrics [facility] [phone], [place] [address] Cell Phone (Mon-Fri 8am-5pm):  [phone] On Call:  [phone] & follow prompts after 5pm & weekends Office Phone:  [phone] Office Fax:  [phone]

## 2017-07-01 ENCOUNTER — Non-Acute Institutional Stay (SKILLED_NURSING_FACILITY): Payer: PPO | Admitting: Gerontology

## 2017-07-01 DIAGNOSIS — M4120 Other idiopathic scoliosis, site unspecified: Secondary | ICD-10-CM | POA: Diagnosis not present

## 2017-07-01 DIAGNOSIS — R5381 Other malaise: Secondary | ICD-10-CM

## 2017-07-01 DIAGNOSIS — G8929 Other chronic pain: Secondary | ICD-10-CM | POA: Diagnosis not present

## 2017-07-01 DIAGNOSIS — R1311 Dysphagia, oral phase: Secondary | ICD-10-CM | POA: Diagnosis not present

## 2017-07-01 DIAGNOSIS — Z515 Encounter for palliative care: Secondary | ICD-10-CM | POA: Diagnosis not present

## 2017-07-01 LAB — URINE CULTURE

## 2017-07-02 ENCOUNTER — Encounter: Payer: Self-pay | Admitting: Gerontology

## 2017-07-02 NOTE — Progress Notes (Signed)
Location:   [facility] Room Number: 355H Place of Service:  SNF [phone]) Provider:  Toni Arthurs, NP-C  Leone Haven, MD  Patient Care Team: Leone Haven, MD as PCP - General (Family Medicine)  Extended Emergency Contact Information Primary Emergency Contact: [place] Phone: [phone] Mobile Phone: [phone] Relation: Niece Secondary Emergency Contact: Rolly Salter" Address: [address]          [place], [place] [address] Johnnette Litter of [place] Phone: [phone] Mobile Phone: [phone] Relation: Other  Code Status:  DNR Goals of care: Advanced Directive information Advanced Directives 07/02/2017  Does Patient Have a Medical Advance Directive? Yes  Type of Advance Directive Out of facility DNR (pink MOST or yellow form)  Does patient want to make changes to medical advance directive? No - Patient declined  Copy of [place] in Chart? -  Would patient like information on creating a medical advance directive? -     Chief Complaint  Patient presents with  . Acute Visit    Follow up on dying    HPI:  Pt is a 83 y.o. female seen today for an acute visit for worsening LOC. Pt was seen by Palliative Care. Urgent referral to Hospice initiated. Pt now with increased lethargy, no po intake, minimal urine output. Family is now wishing to change to comfort care modalities only. Pt is arouseable. Denies pain. Verbalizes she needs to go to the bathroom, but is unable to stand pivot to [facility] or transfer with use of [place] lift d/t significant weakness. Pt refuses use of a foley d/t past experiences of discomfort with them. She denies pain at this time. Denies SOB. She is lethargic but oriented, able to make needs/ wants known with use of minimal words. Family at bedside. B- feet mottled on plantar surface and toes. Fingers, palmar surface of hands mottled. Feet and hands cold to touch. Pt  appears comfortable at this time. Vital remain stable. No other complaints.     Past Medical History:  Diagnosis Date  . Age-related osteoporosis with current pathological fracture with routine healing 05/21/2017  . Arthritis   . Atrial fibrillation ([place])   . Carcinoma of the skin, basal cell   . CHF (congestive heart failure) ([place])   . Chickenpox   . CKD (chronic kidney disease)   . GERD (gastroesophageal reflux disease)   . Hiatal hernia 11/25/2016  . HTN, goal below 140/90 05/21/2017  . Hypertension   . Low sodium levels   . Pacemaker   . Primary osteoarthritis involving multiple joints 05/21/2017  . Urge incontinence   . UTI (lower urinary tract infection)    Past Surgical History:  Procedure Laterality Date  . ABDOMINAL HYSTERECTOMY    . APPENDECTOMY    . BASAL CELL CARCINOMA EXCISION Left    cheek  . broken pelvis    . BUNIONECTOMY Bilateral   . CARDIAC CATHETERIZATION    . CORONARY ANGIOGRAM  2015   [place]   . CORONARY ANGIOGRAM  2015  . HEMORRHOID SURGERY    . PACEMAKER INSERTION  12/25/2008   [employer] pacemaker DRRF 2210 Serial [id number]  . PACEMAKER PLACEMENT  2005  . PPM GENERATOR CHANGEOUT N/A 01/14/2017   Procedure: PPM Generator Changeout;  Surgeon: Deboraha Sprang, MD;  Location: [facility];  Service: Cardiovascular;  Laterality: N/A;  . SKIN SURGERY     removal of a mass on cheek  .  SMALL INTESTINE SURGERY  2016  . TONSILLECTOMY      Allergies  Allergen Reactions  . Tetanus Toxoid, Adsorbed Rash  . Tetanus Toxoids Rash    Allergies as of 07/01/2017      Reactions   Tetanus Toxoid, Adsorbed Rash   Tetanus Toxoids Rash      Medication List       Accurate as of 07/01/17 11:59 PM. Always use your most recent med list.          ASPERCREME LIDOCAINE 4 % Ptch Generic drug:  Lidocaine Apply 1 patch topically daily. Apply to area pt specifies of pain to the left ribs. Remove patch after 12 hours.   MAG-AL PLUS XS 185-631-49 MG/5ML  suspension Generic drug:  alum & mag hydroxide-simeth Take 30 mLs by mouth every 4 (four) hours as needed.   magnesium hydroxide 400 MG/5ML suspension Commonly known as:  MILK OF MAGNESIA Take 30 mLs by mouth every 4 (four) hours as needed. If constipation/ no BM for 2 days   ondansetron 4 MG tablet Commonly known as:  ZOFRAN Take 1 tablet (4 mg total) by mouth every 6 (six) hours as needed for nausea.   oxyCODONE 5 MG immediate release tablet Commonly known as:  Oxy IR/ROXICODONE Take 1 tablet (5 mg total) by mouth every 4 (four) hours as needed for moderate pain.   REFRESH 1.4-0.6 % Soln Generic drug:  Polyvinyl Alcohol-Povidone PF Place 1 drop into both eyes daily.   senna 8.6 MG Tabs tablet Commonly known as:  SENOKOT Take 2 tablets by mouth 2 (two) times daily.   sodium chloride 0.65 % Soln nasal spray Commonly known as:  OCEAN Place 1 spray into both nostrils 2 (two) times daily as needed for congestion.   traZODone 50 MG tablet Commonly known as:  DESYREL Take 50 mg by mouth at bedtime.       Review of Systems  Constitutional: Positive for activity change, appetite change and fatigue. Negative for chills, diaphoresis and fever.  HENT: Negative for congestion, sneezing, sore throat, trouble swallowing and voice change.   Respiratory: Negative for apnea, cough, choking, chest tightness, shortness of breath and wheezing.   Cardiovascular: Negative for chest pain, palpitations and leg swelling.  Gastrointestinal: Negative for abdominal distention, abdominal pain, constipation, diarrhea and nausea.  Genitourinary: Negative for difficulty urinating, dysuria, frequency and urgency.  Musculoskeletal: Positive for arthralgias (typical arthritis), neck pain and neck stiffness. Negative for back pain, gait problem and myalgias.  Skin: Positive for wound. Negative for color change, pallor and rash.  Neurological: Positive for weakness. Negative for dizziness, tremors, syncope,  speech difficulty, numbness and headaches.  Psychiatric/Behavioral: Positive for confusion. Negative for agitation and behavioral problems.  All other systems reviewed and are negative.   Immunization History  Administered Date(s) Administered  . Influenza, High Dose Seasonal PF 08/27/2016   Pertinent  Health Maintenance Due  Topic Date Due  . DEXA SCAN  07/03/1989  . PNA vac Low Risk Adult (2 of 2 - PPSV23) 09/22/2016  . INFLUENZA VACCINE  06/24/2017   Fall Risk  02/25/2017 10/20/2016 06/02/2016 05/06/2016 04/08/2016  Falls in the past year? No No No No No  Comment - - - - -  Number falls in past yr: - - - - -  Injury with Fall? - - - - -  Risk Factor Category  - - - - -  Risk for fall due to : - - - - -  Follow up - - - - -  Comment - - - - -   Functional Status Survey:    Vitals:   07/01/17 1258  BP: (!) 166/87  Pulse: 74  Resp: 16  Temp: 97.6 F (36.4 C)  SpO2: 97%  Weight: 109 lb 9.6 oz (49.7 kg)  Height: 5' (1.524 m)   Body mass index is 21.4 kg/m. Physical Exam  Constitutional: She is oriented to person, place, and time. Vital signs are normal. She appears well-developed and well-nourished. She appears lethargic. She is active and cooperative. She is easily aroused. She appears ill. No distress. Cervical collar in place.  HENT:  Head: Normocephalic and atraumatic.  Mouth/Throat: Uvula is midline and oropharynx is clear and moist. Mucous membranes are not pale, dry (somewhat) and not cyanotic.  Eyes: Pupils are equal, round, and reactive to light. Conjunctivae, EOM and lids are normal.  Neck: Trachea normal, normal range of motion and full passive range of motion without pain. Neck supple. No JVD present. No tracheal deviation, no edema and no erythema present. No thyromegaly present.  Cardiovascular: Normal rate, regular rhythm, normal heart sounds and intact distal pulses.  Exam reveals no gallop, no distant heart sounds and no friction rub.   No murmur  heard. Pulses:      Dorsalis pedis pulses are 2+ on the right side, and 2+ on the left side.  Pulmonary/Chest: Effort normal and breath sounds normal. No accessory muscle usage. No respiratory distress. She has no decreased breath sounds. She has no wheezes. She has no rhonchi. She has no rales. She exhibits no tenderness.  Abdominal: Soft. Normal appearance and bowel sounds are normal. She exhibits no distension and no ascites. There is no tenderness.  Musculoskeletal: She exhibits no edema or tenderness.       Cervical back: She exhibits decreased range of motion, bony tenderness and pain.  Expected osteoarthritis, stiffness; C-collar in place; Calves soft, supple, negative homan's sign; pelvic fractures  Neurological: She is oriented to person, place, and time and easily aroused. She has normal strength. She appears lethargic.  Skin: Skin is warm, dry and intact. No rash noted. She is not diaphoretic. There is cyanosis (hands, feet- mottling). No erythema. No pallor. Nails show no clubbing.     Psychiatric: She has a normal mood and affect. Her speech is normal and behavior is normal. Judgment and thought content normal. Cognition and memory are normal.  Nursing note and vitals reviewed.   Labs reviewed:  Recent Labs  05/22/17 0630 06/09/17 1100 06/29/17 1610  NA 131* 137 138  K 3.9 4.1 3.7  CL 104 104 104  CO2 22 26 18*  GLUCOSE 87 130* 113*  BUN 17 25* 58*  CREATININE 0.81 0.84 1.16*  CALCIUM 8.3* 8.6* 10.3  MG  --   --  2.2    Recent Labs  05/22/17 0630 06/09/17 1100 06/29/17 1610  AST 14* 18 16  ALT 8* 7* 8*  ALKPHOS 37* 301* 141*  BILITOT 0.9 0.6 0.7  PROT 5.4* 5.8* 7.1  ALBUMIN 2.7* 2.9* 3.4*    Recent Labs  05/22/17 0630 06/09/17 1100 06/29/17 1610  WBC 6.6 6.2 16.6*  NEUTROABS 4.0 4.1 13.8*  HGB 9.9* 11.3* 11.9*  HCT 29.2* 34.2* 35.8  MCV 91.0 95.0 94.0  PLT 249 342 648*   Lab Results  Component Value Date   TSH 5.675 (H) 06/29/2017   No  results found for: HGBA1C No results found for: CHOL, HDL, LDLCALC, LDLDIRECT, TRIG, CHOLHDL  Significant Diagnostic Results in last  30 days:  No results found.  Assessment/Plan 1. Malaise 2. Encounter for dying care  DC IVF  DC IV abt  DC all non-essential medications  Morphine 20 mg/ mL- 0.25-0.5 mL po /sl Q 1 hour prn pain, dyspnea 30 mL bottle; no refills  Lorazepam 0.5 mg 1 tablet po Q 4 hours prn- anxiety, agitation  DC santyl dressing changes  Apply Allevyn dressing to back wound and change prn  Oxygen 2-5 liters/ minute [place]- titrate for comfort  DNR  Family/ staff Communication:   Total Time:  Documentation:  Face to Face:  Family/Phone:   Labs/tests ordered:    Medication list reviewed and assessed for continued appropriateness.  Vikki Ports, NP-C Geriatrics [facility] [phone], [place] [address] Cell Phone (Mon-Fri 8am-5pm):  [phone] On Call:  [phone] & follow prompts after 5pm & weekends Office Phone:  [phone] Office Fax:  [phone]

## 2017-07-25 DEATH — deceased

## 2018-02-26 ENCOUNTER — Ambulatory Visit: Payer: PPO

## 2018-08-30 IMAGING — DX DG CHEST 1V PORT
1 series · 1 of 1 positions shown · non-contrast
Comparison: 05/17/2017

CLINICAL DATA: Recent fall

EXAM:
PORTABLE CHEST 1 VIEW

[chest ap]
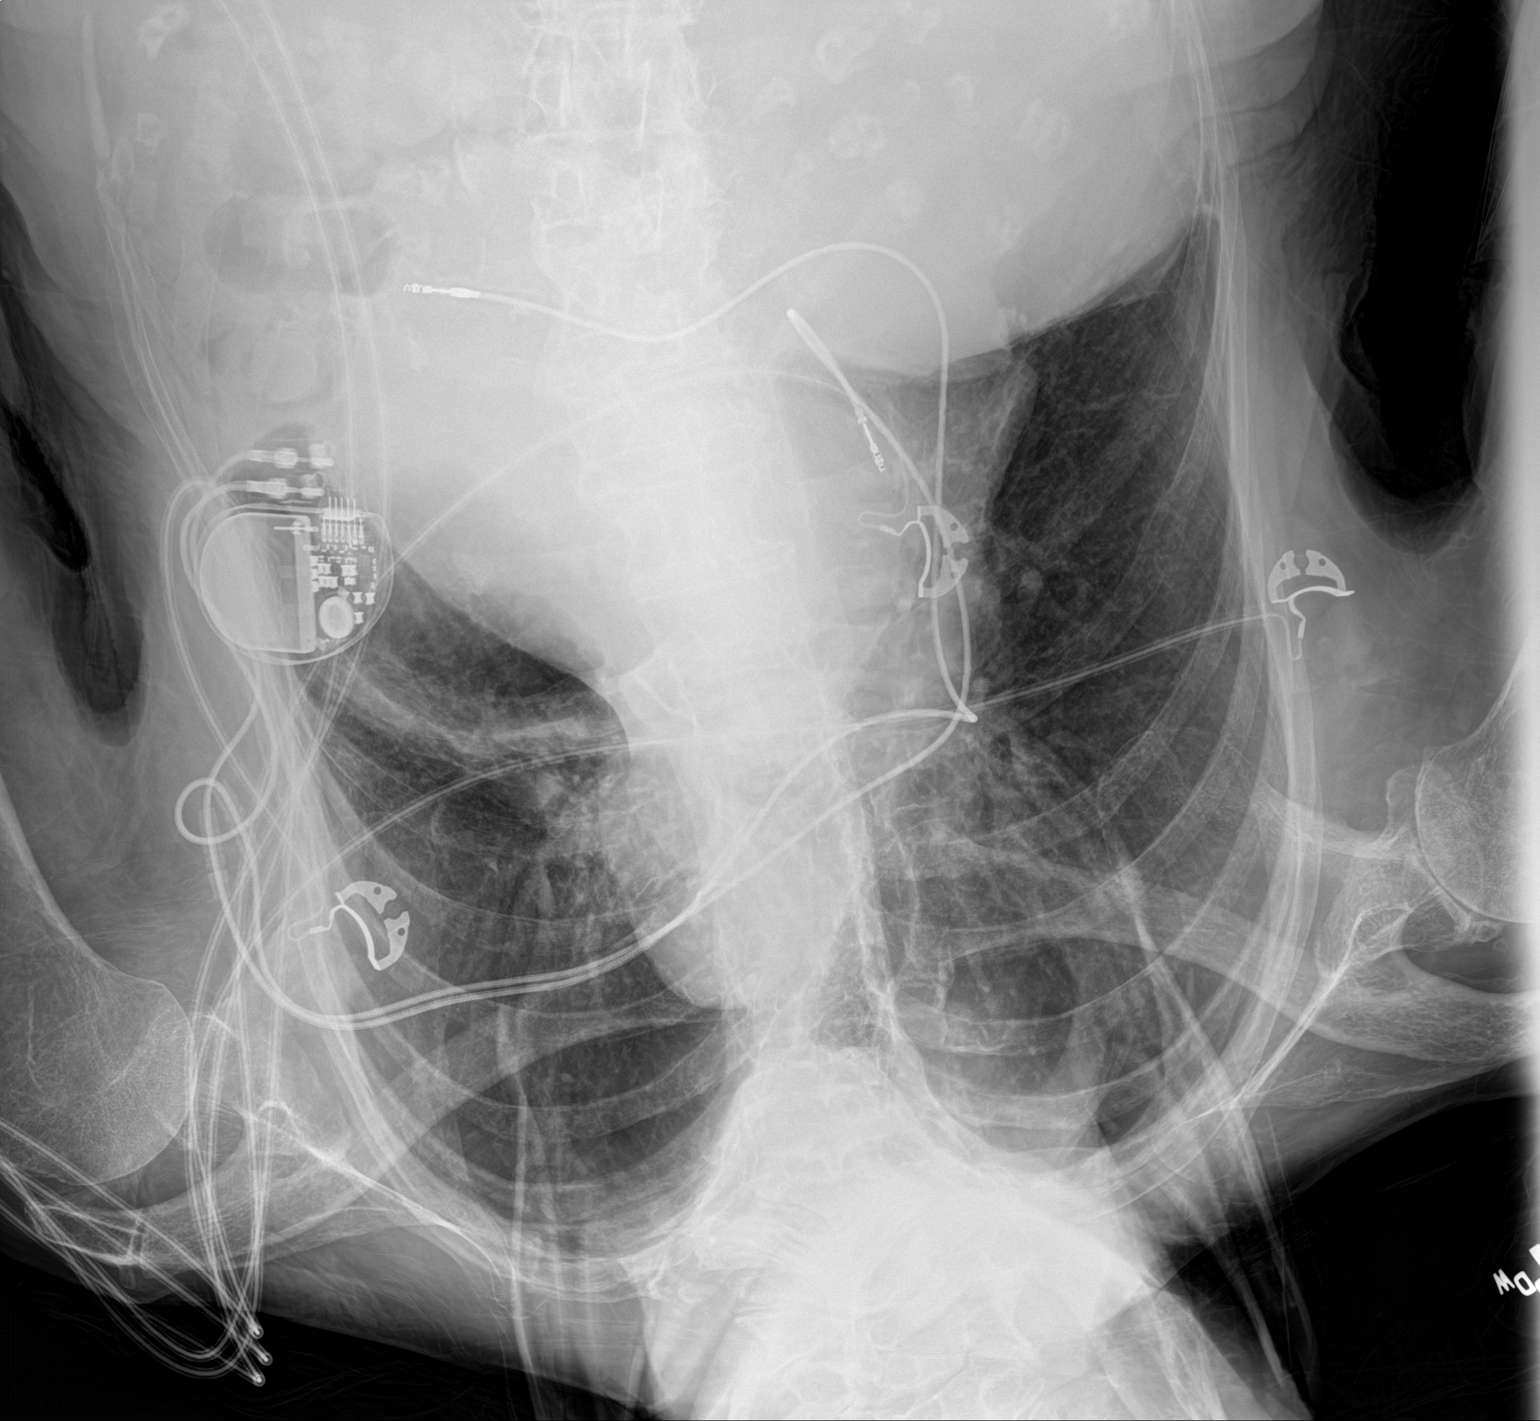

[1 of 1 positions shown; findings below may reference images not displayed]

FINDINGS: Cardiac shadow remains enlarged. Large hiatal hernia is again seen.
No pneumothorax is noted. The known left rib fracture is not well
appreciated on this exam. The lungs are clear. No other focal
abnormality is noted. Pacing device is again seen and stable.
IMPRESSION: No acute abnormality noted.

Chronic changes as described.

## 2018-08-30 IMAGING — CR DG THORACIC SPINE 2V
2 series · 3 of 3 positions shown · non-contrast
Comparison: None.

CLINICAL DATA: Per EMS pt fell at home unwitnessed. Pt states no
LOC and no dizziness. Pt states she stood up with her walker and
tried to pull her pants up and tripped and fell on her left side. Pt
states pain [DATE]- mostly left hip and side/shoulder.

EXAM:
THORACIC SPINE 2 VIEWS

[t-spine ap]
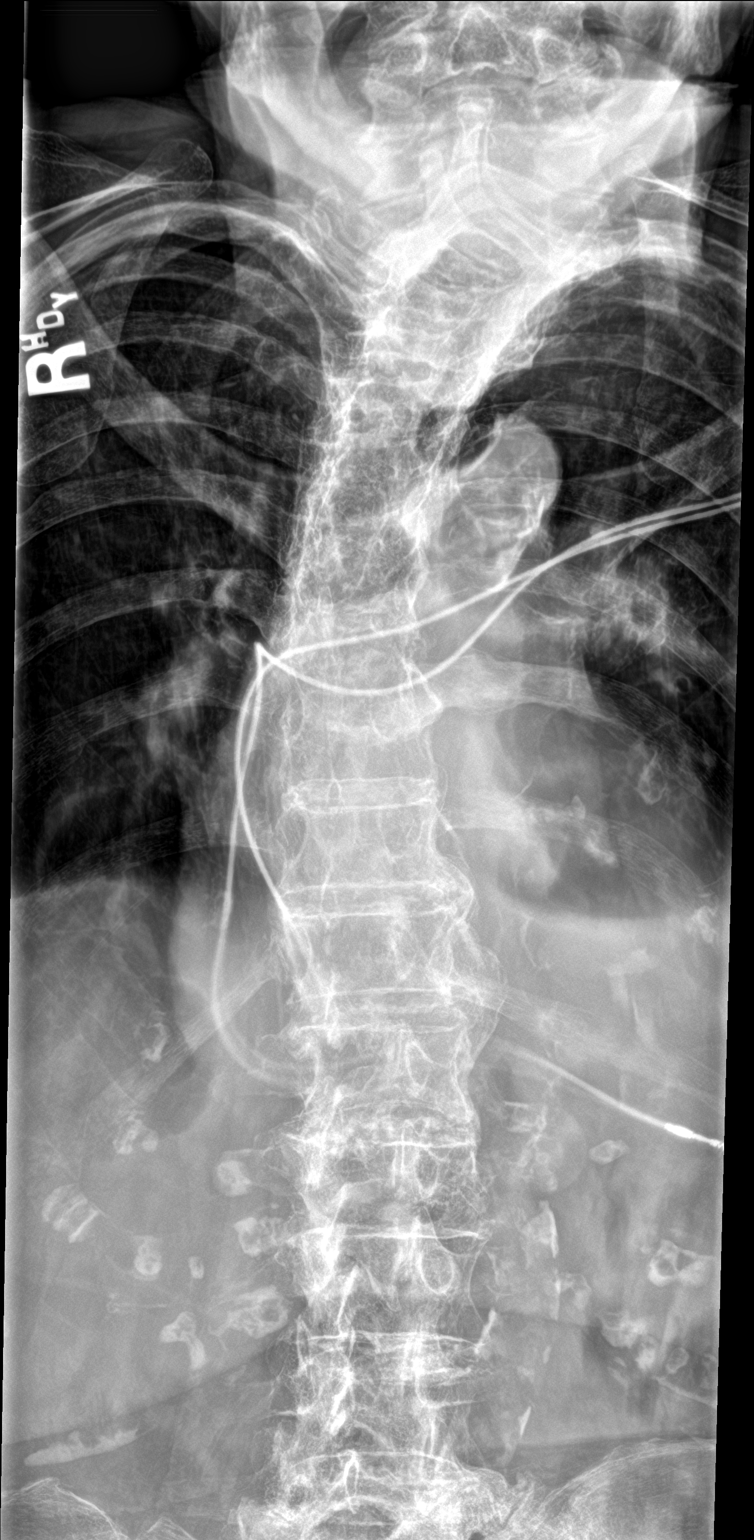

[Series 2: t-spine lat · 0.14mm/px · 2 of 2 slices shown]
[im 1/2]
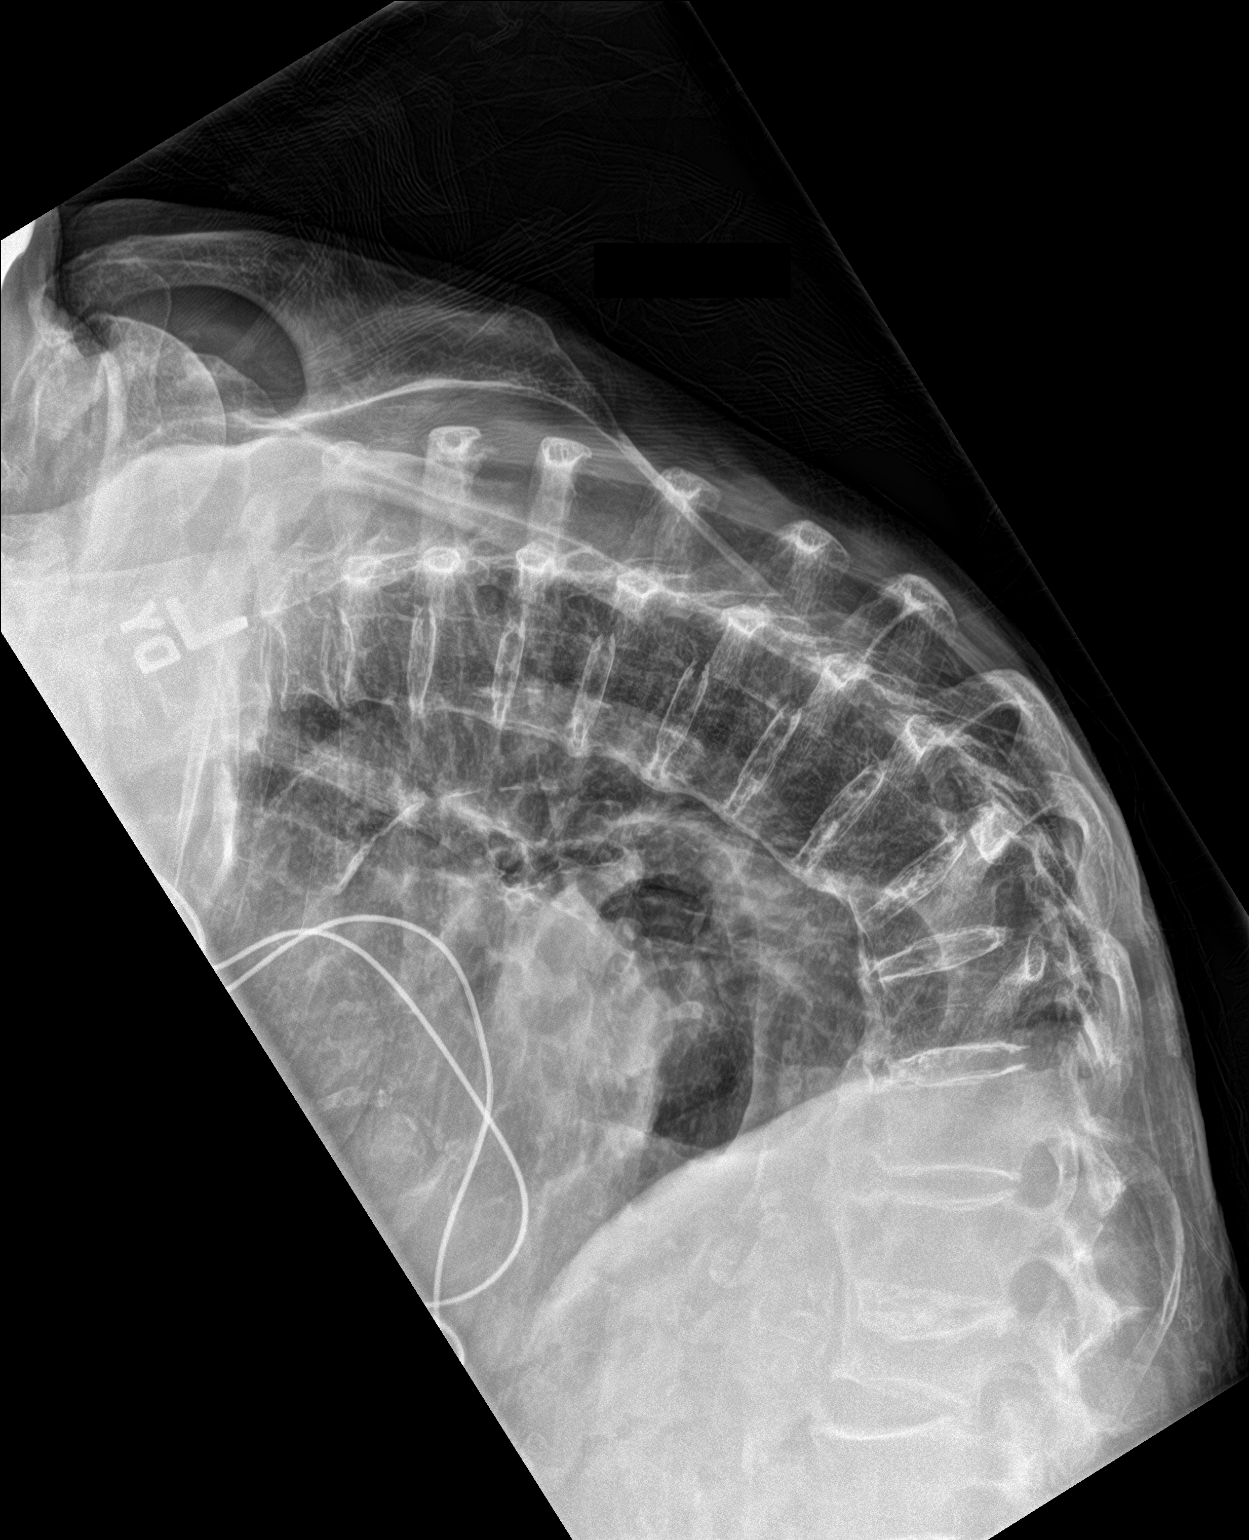
[im 2/2]
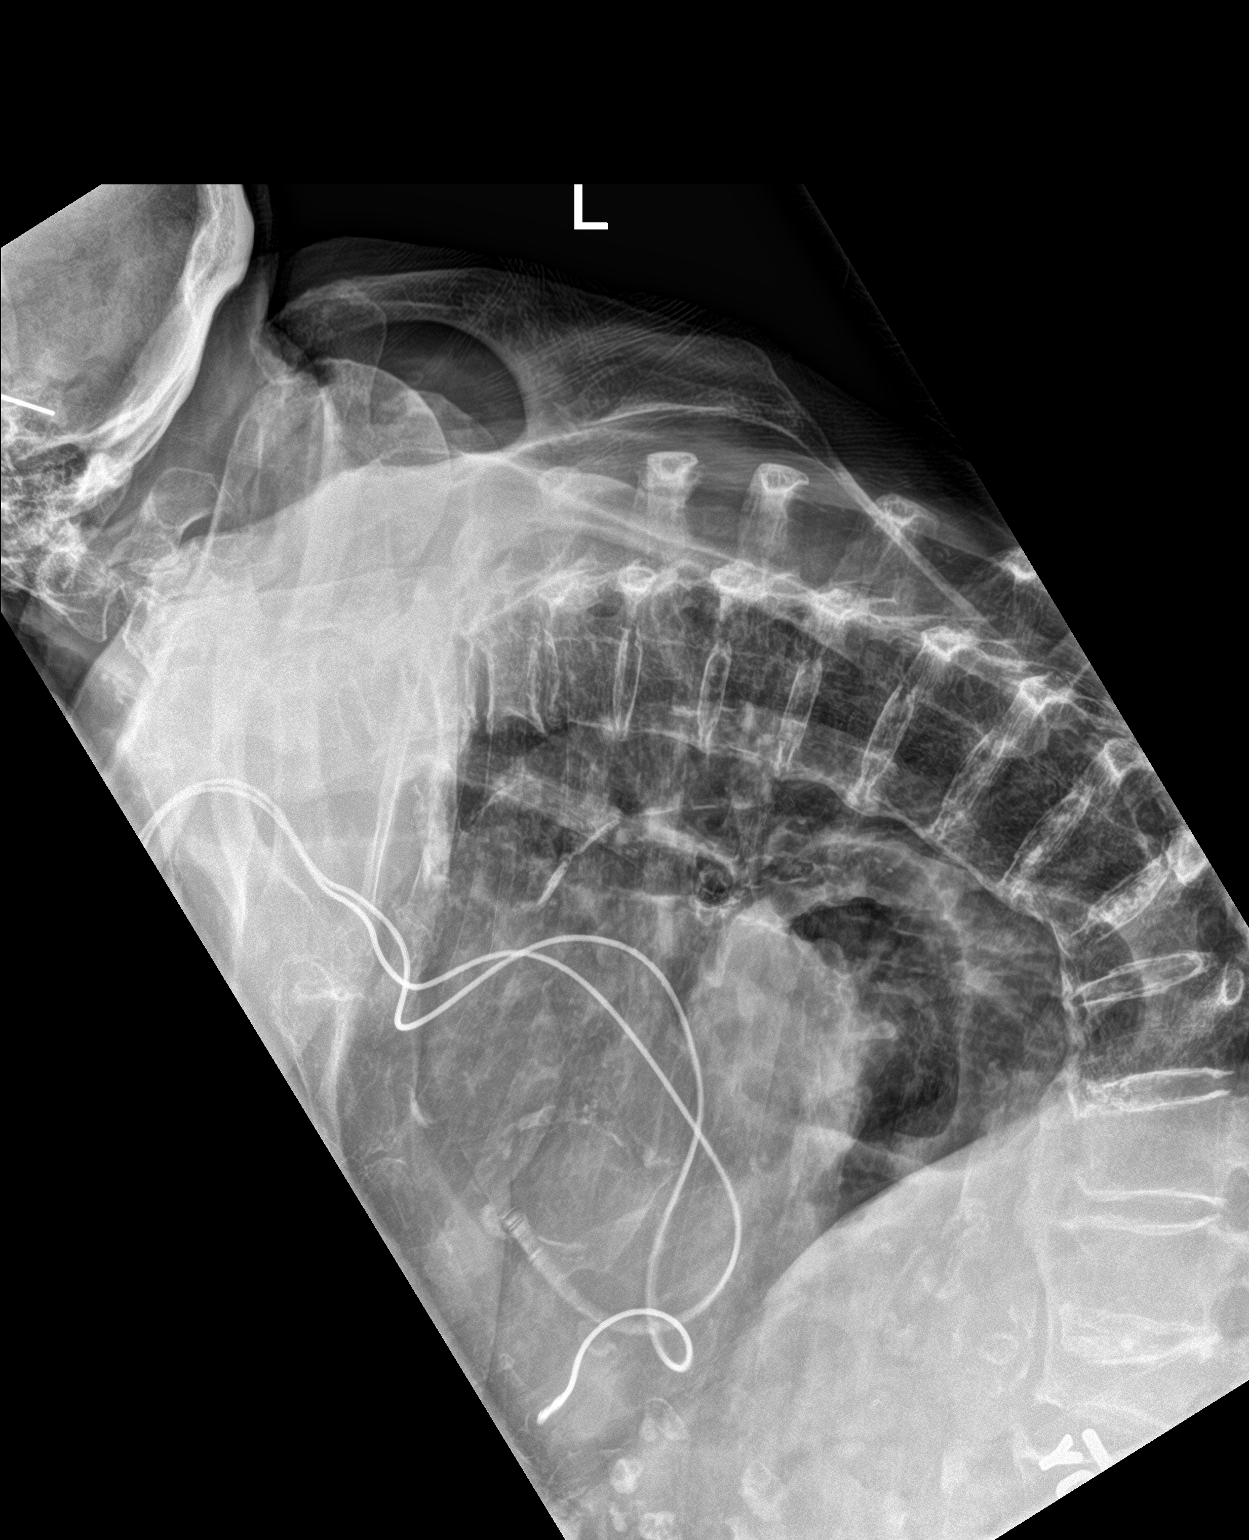

[3 of 3 positions shown; findings below may reference images not displayed]

FINDINGS: Marked kyphosis. Chronic appearing compression deformities of a
lower thoracic vertebral body and upper lumbar vertebral body. No
acute appearing osseous abnormality.
IMPRESSION: 1. Chronic appearing compression deformities of a lower thoracic
vertebral body and an upper lumbar vertebral body.
2. Marked kyphosis.
3. The compression fracture deformities of T1 and T2 are better seen
on today's cervical spine CT. Please see the CT report.

## 2018-08-30 IMAGING — CR DG RIBS W/ CHEST 3+V*L*
3 series · 3 of 3 positions shown · non-contrast
Comparison: Chest x-ray dated 10/20/2016.

CLINICAL DATA: Per EMS pt fell at home unwitnessed. Pt states no
LOC and no dizziness. Pt states she stood up with her walker and
tried to pull her pants up and tripped and fell on her left side. Pt
states pain [DATE]- mostly left hip and side/shoulder.

EXAM:
LEFT RIBS AND CHEST - 3+ VIEW

[chest pa]
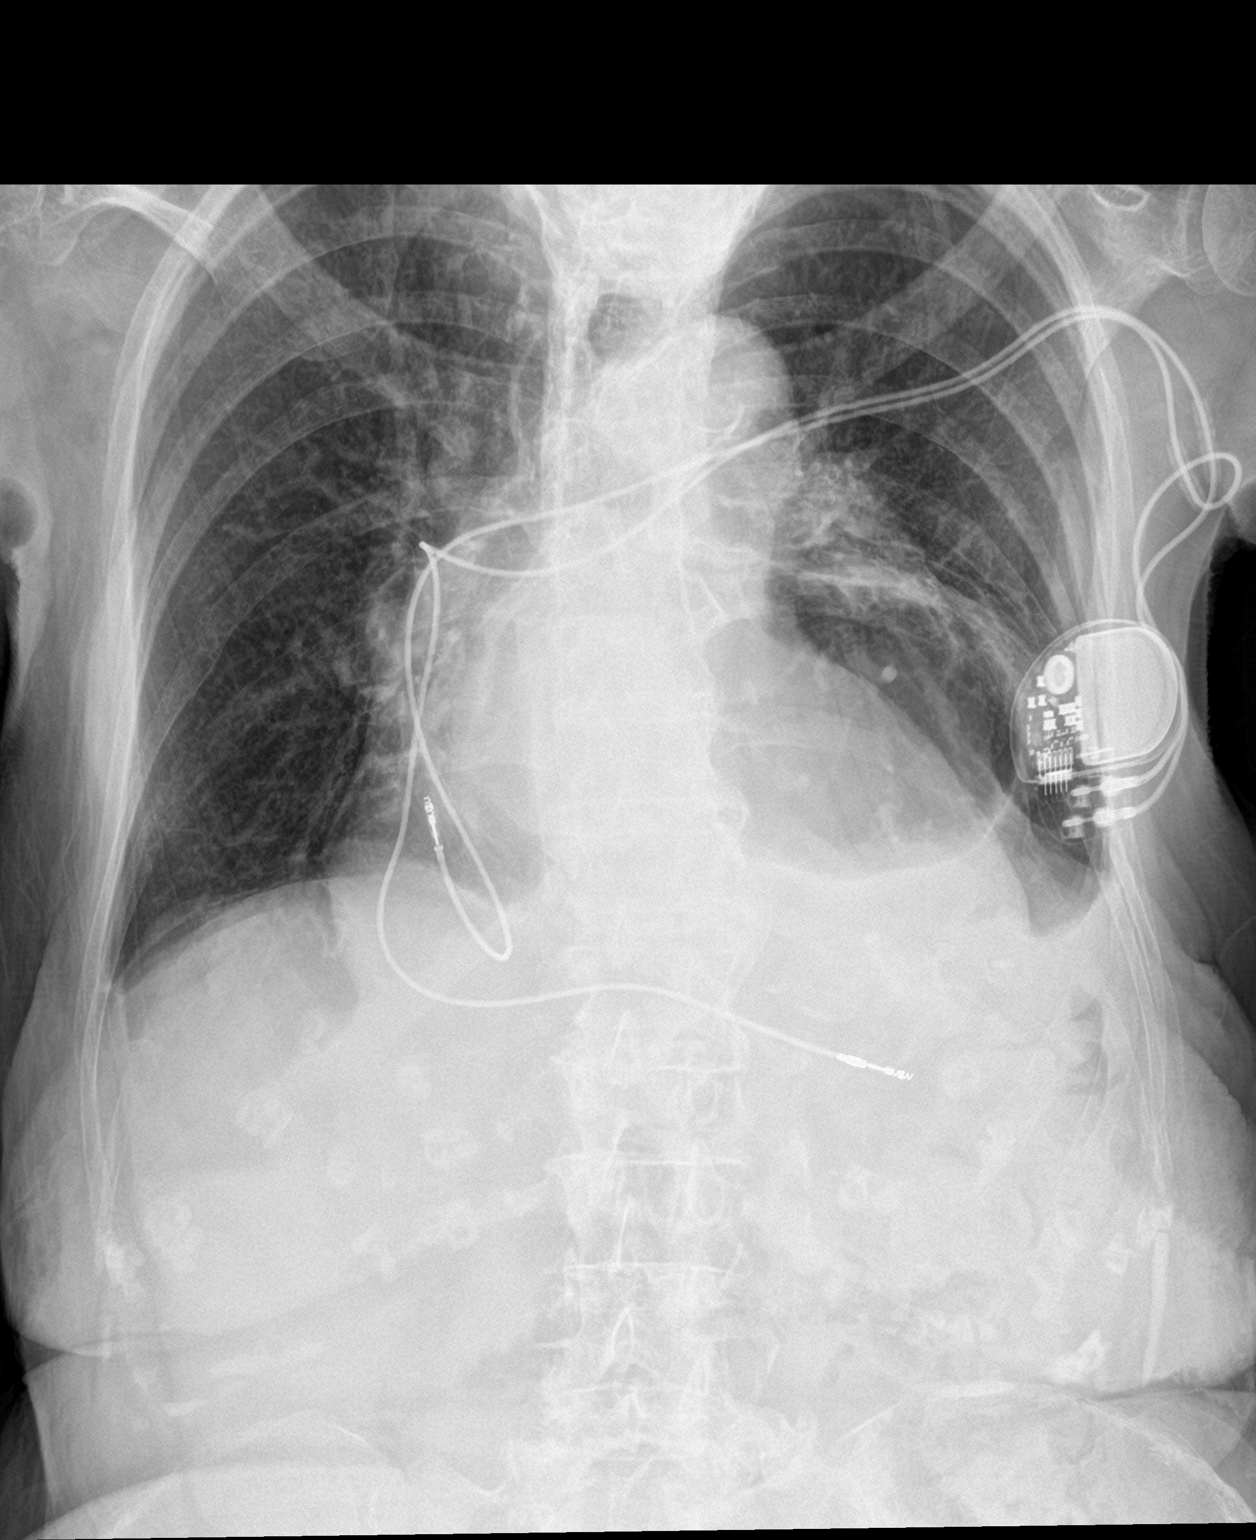

[rib pa]
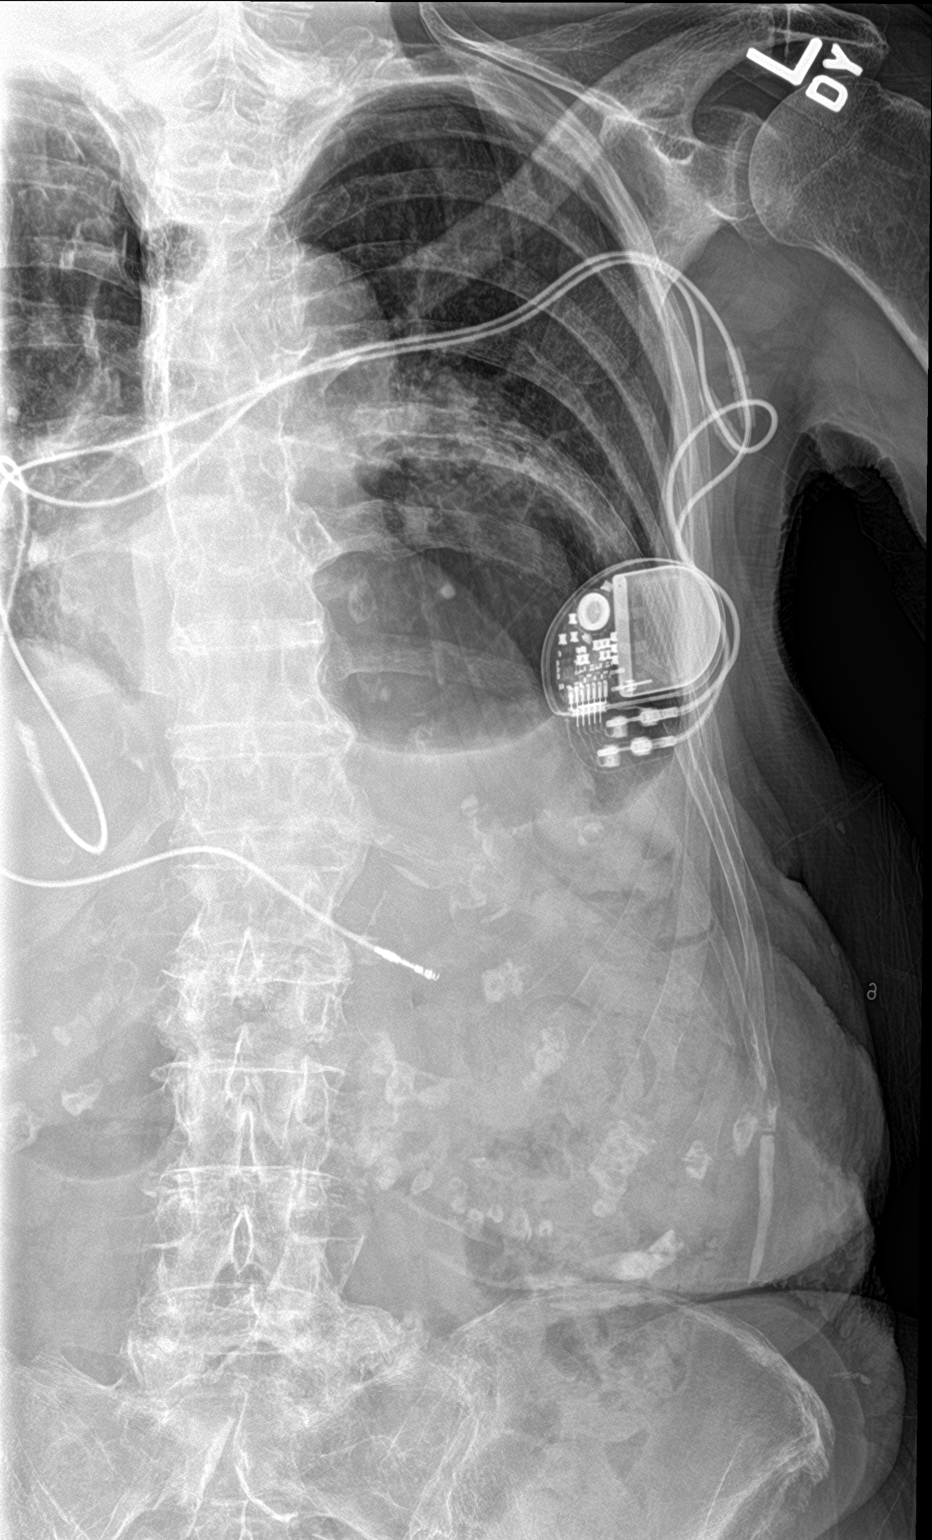

[rib ap obl]
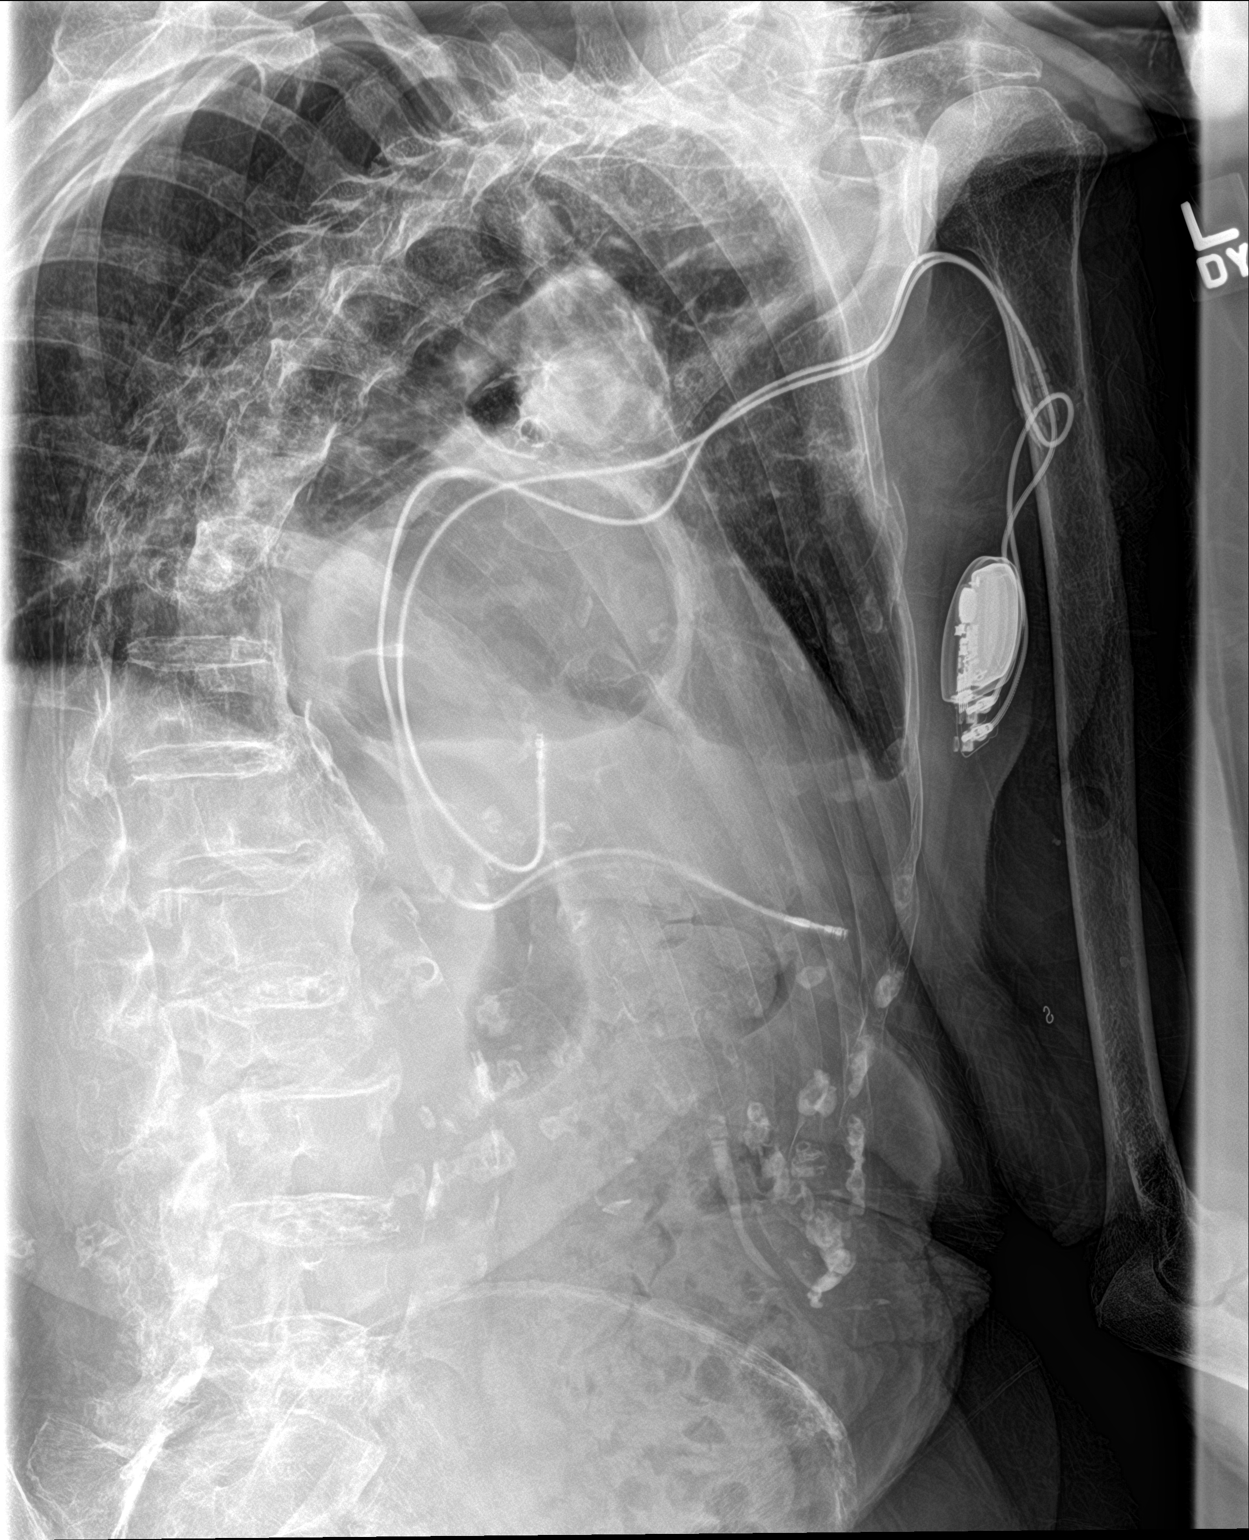

[3 of 3 positions shown; findings below may reference images not displayed]

FINDINGS: Cardiomegaly is stable. Atherosclerotic changes noted at the aortic
arch. Probable large hiatal hernia.

Chronic blunting at the left costophrenic angle, chronic small
pleural effusion versus pleural thickening. Lungs otherwise clear.
No new lung findings. No pneumothorax seen.

Displaced fracture of an upper left anterior-lateral rib, uncertain
number.
IMPRESSION: 1. Displaced fracture of an upper left-sided rib, anterior-lateral
aspect, uncertain number, only seen on 1 of the rib views.
2. Stable cardiomegaly.
3. Probable large hiatal hernia.
4. No new lung findings.  No pneumothorax seen.
